# Patient Record
Sex: Female | Born: 1945
Health system: Southern US, Community
[De-identification: ages and names within clinical notes are randomized; demographics above are authoritative.]

## PROBLEM LIST (undated history)

## (undated) DIAGNOSIS — E785 Hyperlipidemia, unspecified: Secondary | ICD-10-CM

## (undated) DIAGNOSIS — N200 Calculus of kidney: Secondary | ICD-10-CM

## (undated) DIAGNOSIS — M8580 Other specified disorders of bone density and structure, unspecified site: Secondary | ICD-10-CM

## (undated) DIAGNOSIS — I10 Essential (primary) hypertension: Secondary | ICD-10-CM

## (undated) DIAGNOSIS — M1990 Unspecified osteoarthritis, unspecified site: Secondary | ICD-10-CM

## (undated) DIAGNOSIS — Z86010 Personal history of colonic polyps: Secondary | ICD-10-CM

## (undated) DIAGNOSIS — T7840XA Allergy, unspecified, initial encounter: Secondary | ICD-10-CM

## (undated) HISTORY — DX: Essential (primary) hypertension: I10

## (undated) HISTORY — DX: Other specified disorders of bone density and structure, unspecified site: M85.80

## (undated) HISTORY — PX: DILATION AND CURETTAGE OF UTERUS: SHX78

## (undated) HISTORY — DX: Unspecified osteoarthritis, unspecified site: M19.90

## (undated) HISTORY — DX: Hyperlipidemia, unspecified: E78.5

## (undated) HISTORY — DX: Allergy, unspecified, initial encounter: T78.40XA

## (undated) HISTORY — DX: Calculus of kidney: N20.0

## (undated) HISTORY — DX: Personal history of colonic polyps: Z86.010

## (undated) HISTORY — PX: COLONOSCOPY: SHX174

---

## 1978-04-21 HISTORY — PX: ABDOMINAL HYSTERECTOMY: SHX81

## 2003-04-07 ENCOUNTER — Other Ambulatory Visit: Admission: RE | Admit: 2003-04-07 | Discharge: 2003-04-07 | Payer: Self-pay | Admitting: Family Medicine

## 2004-04-26 ENCOUNTER — Ambulatory Visit: Payer: Self-pay | Admitting: Family Medicine

## 2004-07-17 ENCOUNTER — Other Ambulatory Visit: Admission: RE | Admit: 2004-07-17 | Discharge: 2004-07-17 | Payer: Self-pay | Admitting: Family Medicine

## 2004-07-17 ENCOUNTER — Ambulatory Visit: Payer: Self-pay | Admitting: Family Medicine

## 2004-07-17 LAB — CONVERTED CEMR LAB: Pap Smear: NORMAL

## 2004-08-01 ENCOUNTER — Ambulatory Visit: Payer: Self-pay | Admitting: Family Medicine

## 2005-07-10 ENCOUNTER — Ambulatory Visit: Payer: Self-pay | Admitting: Family Medicine

## 2005-07-16 ENCOUNTER — Ambulatory Visit: Payer: Self-pay | Admitting: Family Medicine

## 2006-11-25 ENCOUNTER — Ambulatory Visit: Payer: Self-pay | Admitting: Specialist

## 2007-10-05 ENCOUNTER — Encounter: Payer: Self-pay | Admitting: Family Medicine

## 2007-10-05 DIAGNOSIS — M810 Age-related osteoporosis without current pathological fracture: Secondary | ICD-10-CM | POA: Insufficient documentation

## 2007-10-05 DIAGNOSIS — Z87442 Personal history of urinary calculi: Secondary | ICD-10-CM | POA: Insufficient documentation

## 2007-10-05 DIAGNOSIS — E785 Hyperlipidemia, unspecified: Secondary | ICD-10-CM | POA: Insufficient documentation

## 2007-10-05 DIAGNOSIS — M8580 Other specified disorders of bone density and structure, unspecified site: Secondary | ICD-10-CM | POA: Insufficient documentation

## 2007-10-05 DIAGNOSIS — J329 Chronic sinusitis, unspecified: Secondary | ICD-10-CM | POA: Insufficient documentation

## 2007-10-05 DIAGNOSIS — J309 Allergic rhinitis, unspecified: Secondary | ICD-10-CM | POA: Insufficient documentation

## 2007-10-06 ENCOUNTER — Ambulatory Visit: Payer: Self-pay | Admitting: Family Medicine

## 2007-11-04 ENCOUNTER — Ambulatory Visit: Payer: Self-pay | Admitting: Internal Medicine

## 2007-11-04 DIAGNOSIS — K649 Unspecified hemorrhoids: Secondary | ICD-10-CM | POA: Insufficient documentation

## 2007-11-11 ENCOUNTER — Ambulatory Visit: Payer: Self-pay | Admitting: Family Medicine

## 2007-11-19 ENCOUNTER — Encounter (INDEPENDENT_AMBULATORY_CARE_PROVIDER_SITE_OTHER): Payer: Self-pay | Admitting: *Deleted

## 2007-12-16 ENCOUNTER — Ambulatory Visit: Payer: Self-pay | Admitting: Internal Medicine

## 2007-12-16 ENCOUNTER — Encounter: Payer: Self-pay | Admitting: Internal Medicine

## 2007-12-16 LAB — HM COLONOSCOPY

## 2007-12-17 DIAGNOSIS — Z860100 Personal history of colon polyps, unspecified: Secondary | ICD-10-CM

## 2007-12-17 HISTORY — DX: Personal history of colon polyps, unspecified: Z86.0100

## 2007-12-17 HISTORY — DX: Personal history of colonic polyps: Z86.010

## 2007-12-19 ENCOUNTER — Encounter: Payer: Self-pay | Admitting: Internal Medicine

## 2009-01-16 ENCOUNTER — Ambulatory Visit: Payer: Self-pay | Admitting: Family Medicine

## 2009-03-02 ENCOUNTER — Ambulatory Visit: Payer: Self-pay | Admitting: Family Medicine

## 2009-03-02 DIAGNOSIS — E559 Vitamin D deficiency, unspecified: Secondary | ICD-10-CM | POA: Insufficient documentation

## 2009-03-02 LAB — CONVERTED CEMR LAB
ALT: 22 units/L (ref 0–35)
Basophils Relative: 1.1 % (ref 0.0–3.0)
Bilirubin, Direct: 0.1 mg/dL (ref 0.0–0.3)
Chloride: 106 meq/L (ref 96–112)
Direct LDL: 179.6 mg/dL
Eosinophils Relative: 4.6 % (ref 0.0–5.0)
HCT: 40.6 % (ref 36.0–46.0)
Hemoglobin: 13.6 g/dL (ref 12.0–15.0)
Lymphs Abs: 1.4 10*3/uL (ref 0.7–4.0)
MCV: 92.9 fL (ref 78.0–100.0)
Monocytes Absolute: 0.4 10*3/uL (ref 0.1–1.0)
Neutro Abs: 2 10*3/uL (ref 1.4–7.7)
Potassium: 3.8 meq/L (ref 3.5–5.1)
RBC: 4.37 M/uL (ref 3.87–5.11)
TSH: 0.44 microintl units/mL (ref 0.35–5.50)
Total CHOL/HDL Ratio: 6
Total Protein: 7.5 g/dL (ref 6.0–8.3)
VLDL: 22.2 mg/dL (ref 0.0–40.0)
WBC: 4 10*3/uL — ABNORMAL LOW (ref 4.5–10.5)

## 2009-03-14 ENCOUNTER — Encounter: Payer: Self-pay | Admitting: Family Medicine

## 2009-03-29 ENCOUNTER — Encounter (INDEPENDENT_AMBULATORY_CARE_PROVIDER_SITE_OTHER): Payer: Self-pay | Admitting: *Deleted

## 2009-05-23 ENCOUNTER — Ambulatory Visit: Payer: Self-pay | Admitting: Family Medicine

## 2009-05-24 LAB — CONVERTED CEMR LAB
ALT: 21 units/L (ref 0–35)
HDL: 45 mg/dL (ref >39.00)
Total CHOL/HDL Ratio: 4

## 2009-08-23 ENCOUNTER — Ambulatory Visit: Payer: Self-pay | Admitting: Family Medicine

## 2009-08-27 LAB — CONVERTED CEMR LAB
Total CHOL/HDL Ratio: 4
Triglycerides: 85 mg/dL (ref 0.0–149.0)

## 2010-05-02 ENCOUNTER — Ambulatory Visit
Admission: RE | Admit: 2010-05-02 | Discharge: 2010-05-02 | Payer: Self-pay | Source: Home / Self Care | Attending: Family Medicine | Admitting: Family Medicine

## 2010-05-03 ENCOUNTER — Telehealth: Payer: Self-pay | Admitting: Family Medicine

## 2010-05-10 ENCOUNTER — Telehealth: Payer: Self-pay | Admitting: Family Medicine

## 2010-05-19 LAB — CONVERTED CEMR LAB
Cholesterol: 177 mg/dL (ref 0–200)
HDL: 35.4 mg/dL — ABNORMAL LOW (ref >39.0)
LDL Cholesterol: 120 mg/dL — ABNORMAL HIGH (ref 0–99)
VLDL: 21 mg/dL (ref 0–40)

## 2010-05-23 NOTE — Progress Notes (Signed)
Summary: feeling worse   Phone Note Call from Patient Call back at Home Phone [phone]   Caller: Patient Call For: Colleen Cochran Summary of Call: Patient saw you  on the 12th and she says that she  is not feeling any better and actually is feeling worse than before. She is asking if she can get an antibiotic called to [facility] on [address]  Initial call taken by: Melody Comas,  May 10, 2010 11:11 AM  Follow-up for Phone Call        I called patient.  Sx increased last night.  More rhinorrhea with discolored discharge from nose.  Tooth pain noted.  Still with some cough.  Using her inhaler with some transient relief.   Amoxil sent.  follow up as needed. she agrees.  Follow-up by: Crawford Givens Cochran,  May 10, 2010 1:42 PM    New/Updated Medications: AMOXICILLIN 875 MG TABS (AMOXICILLIN) 1 by mouth by mouth two times a day Prescriptions: AMOXICILLIN 875 MG TABS (AMOXICILLIN) 1 by mouth by mouth two times a day  #20 x 0   Entered and Authorized by:   Crawford Givens Cochran   Signed by:   Crawford Givens Cochran on 05/10/2010   Method used:   Electronically to        [facility]. [address] [phone]* (retail)       [address] [place], [place]  [id number]       Ph: [phone]       Fax: [phone]   RxID:   [id number]

## 2010-05-23 NOTE — Progress Notes (Signed)
Summary: update on BP  Phone Note Call from Patient Call back at Home Phone [phone]   Caller: Patient Call For: Judith Part MD Summary of Call: Patient called to let you know that when she went to drug store yesterday afternoon to get her med her BP was 155/74, last night at home it was 137/83, and this morning it was 137/83. Please advise.  Initial call taken by: Melody Comas,  May 03, 2010 3:31 PM  Follow-up for Phone Call        Here readings are fine.  If consistently above 140/90, then notify the clinic.  thanks.  Follow-up by: Crawford Givens MD,  May 03, 2010 5:27 PM  Additional Follow-up for Phone Call Additional follow up Details #1::        Patient Advised.   Lugene Fuquay CMA (AAMA)  May 03, 2010 5:29 PM

## 2010-05-23 NOTE — Assessment & Plan Note (Signed)
Summary: COUGH/CLE   Vital Signs:  Patient profile:   65 year old female Menstrual status:  hysterectomy Height:      64.25 inches Weight:      146.75 pounds BMI:     25.08 Temp:     98.4 degrees F oral Pulse rate:   84 / minute Pulse rhythm:   regular BP sitting:   170 / 84  (left arm) Cuff size:   regular  Vitals Entered By: Delilah Shan CMA Nahmir Zeidman Dull) (May 02, 2010 3:24 PM) CC: Cough   History of Present Illness: Started with cough and cold symptoms.  Cough would fluctuate, worse at night and early AM.  Tickle in throat and then would have fits of coughing.   Cough is some better today.  Fatigued and not sleeping well.  Hears an occ wheeze.  Some sputum initially, not much now.  Took otc meds with some relief.  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  Otherwise negative.    Physical Exam  General:  no apparent distress normocephalic atraumatic tm wnl nasal exam unremarkable, no erythema mucous membranes moist no exudates neck supple w/o LA scattered wheeze/ronchi.  ronchi clear with cough no increase in wob.  no focal decrease in bs regular rate and rhythm ext well perfused.    Impression & Recommendations:  Problem # 1:  COUGH (ICD-786.2) D/w patient to avoid meds with pseudophed.  Will use SABA as needed.  She likey has a prolonged cough due to prev viral infection that is slowly improving . She'll check BP at home and call back with update . She agrees. No indication for antibiotics as she is improving and this is likely not going to respond to antibiotics . Orders: Prescription Created Electronically [phone])  Complete Medication List: 1)  Calcium 600 600 Mg Tabs (Calcium carbonate) .... Take 1 tablet by  mouth once a day 2)  Zocor 20 Mg Tabs (Simvastatin) .... 1/2 by mouth once daily 3)  Vitamin D 1000 Unit Tabs (Cholecalciferol) .... Take 1 tablet by mouth once a day 4)  Ventolin Hfa 108 (90 Base) Mcg/act Aers (Albuterol sulfate) .Marland Kitchen.. 1-2 puffs every 4 hours as needed for cough or wheeze  Patient Instructions: 1)  Get plenty of rest, drink lots of clear liquids, and try plain mucinex.  I wouldn't use anything with "-D" in the title because the pseudophed can raise your pressure.   2)  Check your pressure at the house in the morning and let us know about the numbers.   3)  I would use the albuterol inhaler every 4 hours as needed for the cough and wheeze.  4)  Take care.  Prescriptions: VENTOLIN HFA 108 (90 BASE) MCG/ACT AERS (ALBUTEROL SULFATE) 1-2 puffs every 4 hours as needed for cough or wheeze  #1 x 1   Entered by:   Crawford Givens MD   Authorized by:   Judith Part MD   Signed by:   Crawford Givens MD on 05/02/2010   Method used:   Electronically to        [facility]. [employer] [phone]* (retail)       [address] [place],  [place]  [id number]       Ph: [phone]       Fax: [phone]   RxID:   [id number] ZOCOR 20 MG TABS (SIMVASTATIN) 1/2 by mouth once daily  #15 Tablet x 1   Entered by:   Delilah Shan CMA (AAMA)   Authorized by:   Judith Part MD   Signed by:   Delilah Shan CMA Evamae Rowen Dull) on 05/02/2010   Method used:   Electronically to        [facility]. [address] [phone]* (retail)       [address] [place], [place]  [id number]       Ph: [phone]       Fax: [phone]   RxID:   [id number]    Orders Added: 1)  Est. Patient Level III [62831] 2)  Prescription Created Electronically [phone]    Current Allergies (reviewed today): No known allergies

## 2010-06-20 ENCOUNTER — Other Ambulatory Visit: Payer: Self-pay | Admitting: Family Medicine

## 2010-06-20 ENCOUNTER — Other Ambulatory Visit (INDEPENDENT_AMBULATORY_CARE_PROVIDER_SITE_OTHER): Payer: 59

## 2010-06-20 ENCOUNTER — Encounter (INDEPENDENT_AMBULATORY_CARE_PROVIDER_SITE_OTHER): Payer: Self-pay | Admitting: *Deleted

## 2010-06-20 ENCOUNTER — Telehealth (INDEPENDENT_AMBULATORY_CARE_PROVIDER_SITE_OTHER): Payer: Self-pay | Admitting: *Deleted

## 2010-06-20 DIAGNOSIS — Z0000 Encounter for general adult medical examination without abnormal findings: Secondary | ICD-10-CM

## 2010-06-20 DIAGNOSIS — E785 Hyperlipidemia, unspecified: Secondary | ICD-10-CM

## 2010-06-20 DIAGNOSIS — E559 Vitamin D deficiency, unspecified: Secondary | ICD-10-CM

## 2010-06-20 LAB — BASIC METABOLIC PANEL
CO2: 28 mEq/L (ref 19–32)
Calcium: 9.1 mg/dL (ref 8.4–10.5)
Chloride: 102 mEq/L (ref 96–112)
Creatinine, Ser: 0.5 mg/dL (ref 0.4–1.2)
Glucose, Bld: 91 mg/dL (ref 70–99)

## 2010-06-20 LAB — CBC WITH DIFFERENTIAL/PLATELET
Basophils Absolute: 0 10*3/uL (ref 0.0–0.1)
Basophils Relative: 0.6 % (ref 0.0–3.0)
Eosinophils Absolute: 0.1 10*3/uL (ref 0.0–0.7)
Hemoglobin: 13.3 g/dL (ref 12.0–15.0)
Lymphocytes Relative: 27.3 % (ref 12.0–46.0)
MCHC: 34.5 g/dL (ref 30.0–36.0)
MCV: 89.2 fl (ref 78.0–100.0)
Monocytes Absolute: 0.4 10*3/uL (ref 0.1–1.0)
Neutro Abs: 2.7 10*3/uL (ref 1.4–7.7)
Neutrophils Relative %: 61 % (ref 43.0–77.0)
RBC: 4.32 Mil/uL (ref 3.87–5.11)
RDW: 13.4 % (ref 11.5–14.6)

## 2010-06-20 LAB — HEPATIC FUNCTION PANEL
Albumin: 4.3 g/dL (ref 3.5–5.2)
Total Protein: 7.4 g/dL (ref 6.0–8.3)

## 2010-06-20 LAB — LIPID PANEL
Cholesterol: 169 mg/dL (ref 0–200)
HDL: 45.9 mg/dL (ref >39.00)
Triglycerides: 84 mg/dL (ref 0.0–149.0)

## 2010-06-21 ENCOUNTER — Encounter: Payer: Self-pay | Admitting: Family Medicine

## 2010-06-24 ENCOUNTER — Encounter: Payer: Self-pay | Admitting: Family Medicine

## 2010-06-24 ENCOUNTER — Encounter (INDEPENDENT_AMBULATORY_CARE_PROVIDER_SITE_OTHER): Payer: 59 | Admitting: Family Medicine

## 2010-06-24 DIAGNOSIS — R030 Elevated blood-pressure reading, without diagnosis of hypertension: Secondary | ICD-10-CM | POA: Insufficient documentation

## 2010-06-24 DIAGNOSIS — M899 Disorder of bone, unspecified: Secondary | ICD-10-CM

## 2010-06-24 DIAGNOSIS — Z0000 Encounter for general adult medical examination without abnormal findings: Secondary | ICD-10-CM

## 2010-06-24 DIAGNOSIS — E785 Hyperlipidemia, unspecified: Secondary | ICD-10-CM

## 2010-06-24 DIAGNOSIS — Z1231 Encounter for screening mammogram for malignant neoplasm of breast: Secondary | ICD-10-CM

## 2010-06-24 DIAGNOSIS — E559 Vitamin D deficiency, unspecified: Secondary | ICD-10-CM

## 2010-06-24 DIAGNOSIS — Z780 Asymptomatic menopausal state: Secondary | ICD-10-CM

## 2010-06-27 NOTE — Progress Notes (Signed)
----   Converted from flag ---- ---- 06/18/2010 8:45 PM, Colon Flattery Tower MD wrote: please check wellness and lipid and vit D for V70.0 and 272 and 733.0 thanks  ---- 06/17/2010 10:21 AM, Liane Comber CMA (AAMA) wrote: Lab orders please! Good Morning! This pt is scheduled for cpx labs [facility], which labs to draw and dx codes to use? Thanks Tasha ------------------------------

## 2010-07-09 NOTE — Assessment & Plan Note (Signed)
Summary: CPX/RBH   Vital Signs:  Patient profile:   65 year old female Menstrual status:  hysterectomy Height:      64.25 inches Weight:      143 pounds BMI:     24.44 Temp:     98.7 degrees F oral Pulse rate:   88 / minute Pulse rhythm:   regular BP sitting:   150 / 78  (left arm) Cuff size:   regular  Vitals Entered By: Lewanda Rife LPN (June 24, 5407 10:26 AM)  Serial Vital Signs/Assessments:  Time      Position  BP       Pulse  Resp  Temp     By                     141/90                         Judith Part MD  CC: CPX LMP complete hyst ?yr, CHF Management   History of Present Illness: here for wellness exam and to review chronic health problems   is feeling ok  is nervous as usual    wt is down 3 lb with bmi of 24  150/78 good bp-- thinks that is all anxiety  she keeps an eye on her bp due to family hx -- 133/83 average    osteopenia  dexa 09 ca and D D level is 32 she is taking 1000-2000 vit D per day also taking calcium   hyst tot in past- not for cancer  no symptoms  no new partners  pap 06  mam 12/10 self exam  - no lumps or changes   colonosc due 2014 for polyps no stool changes   Td09 does not get flu shots has never had shingles vaccine -- is interested   chol stable on zocor  Last Lipid ProfileCholesterol: 169 (06/20/2010 10:05:54 AM)HDL:  45.90 (06/20/2010 10:05:54 AM)LDL:  106 (06/20/2010 10:05:54 AM)Triglycerides:  Last Liver profileSGOT:  27 (06/20/2010 10:05:54 AM)SPGT:  23 (06/20/2010 10:05:54 AM)T. Bili:  0.5 (06/20/2010 10:05:54 AM)Alk Phos:  58 (06/20/2010 10:05:54 AM)        Allergies (verified): No Known Drug Allergies  Past History:  Past Medical History: Last updated: 10/05/2007 Allergic rhinitis Hyperlipidemia Nephrolithiasis, hx of Osteopenia  Past Surgical History: Last updated: 10/05/2007 Caesarean section (1969) 2 D & C's Hysterectomy- total for bleeding (1980) Kidney stone Uteroscopy with  laser Dexa- osteopenia spine -2.48 Left leg fracture x 2 CT- abd and pelvis, neg for stones (11/2006)  Family History: Last updated: 03/02/2009 Father: kidney failure, CABG, CAD,MI, hip fx (? OP) Mother: HTN, OP, ? 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 Eyes:  Denies blurring and eye irritation. CV:  Denies chest pain or discomfort, palpitations, and shortness of breath with exertion. Resp:  Denies cough, shortness of breath, and wheezing. GI:  Denies abdominal pain, change in bowel habits, and indigestion. GU:  Denies nocturia. MS:  Denies joint pain, joint redness, joint swelling, and stiffness. Derm:  Denies itching, lesion(s), poor wound healing, and rash. Neuro:  Denies numbness and tingling. Psych:  Denies  anxiety. Endo:  Denies excessive thirst and excessive urination. Heme:  Denies abnormal bruising and bleeding.  Physical Exam  General:  Well-developed,well-nourished,in no acute distress; alert,appropriate and cooperative throughout examination Head:  normocephalic, atraumatic, and no abnormalities observed.   Eyes:  vision grossly intact, pupils equal, pupils round, and pupils reactive to light.  no conjunctival pallor, injection or icterus  Ears:  R ear normal and L ear normal.   Nose:  no nasal discharge.   Mouth:  pharynx pink and moist.   Neck:  supple with full rom and no masses or thyromegally, no JVD or carotid bruit  Chest Wall:  No deformities, masses, or tenderness noted. Breasts:  No mass, nodules, thickening, tenderness, bulging, retraction, inflamation, nipple discharge or skin changes noted.   Lungs:  Normal  respiratory effort, chest expands symmetrically. Lungs are clear to auscultation, no crackles or wheezes. Heart:  Normal rate and regular rhythm. S1 and S2 normal without gallop, murmur, click, rub or other extra sounds. Abdomen:  Bowel sounds positive,abdomen soft and non-tender without masses, organomegaly or hernias noted. no renal bruits  Msk:  No deformity or scoliosis noted of thoracic or lumbar spine.   Pulses:  R and L carotid,radial,femoral,dorsalis pedis and posterior tibial pulses are full and equal bilaterally Extremities:  No clubbing, cyanosis, edema, or deformity noted with normal full range of motion of all joints.   Neurologic:  sensation intact to light touch, gait normal, and DTRs symmetrical and normal.   Skin:  Intact without suspicious lesions or rashes Cervical Nodes:  No lymphadenopathy noted Axillary Nodes:  No palpable lymphadenopathy Inguinal Nodes:  No significant adenopathy Psych:  normal affect, talkative and pleasant    Impression & Recommendations:  Problem # 1:  HEALTH MAINTENANCE EXAM (ICD-V70.0) Assessment Comment Only reviewed health habits including diet, exercise and skin cancer prevention reviewed health maintenance list and family history  wellness labs rev with pt in detail   Problem # 2:  UNSPECIFIED VITAMIN D DEFICIENCY (ICD-268.9) Assessment: Unchanged this is stable on current Vit D -- disc also getting outdoor time dexa scheduled  Orders: Radiology Referral (Radiology)  Problem # 3:  ELEVATED BLOOD PRESSURE WITHOUT DIAGNOSIS OF HYPERTENSION (ICD-796.2) Assessment: New  this is slowly on the rise disc exercise program in detail  will f/u 3 mo  given handout on HTN from [facility] to read  BP today: 150/78 Prior BP: 170/84 (05/02/2010)  Labs Reviewed: Creat: 0.5 (06/20/2010) Chol: 169 (06/20/2010)   HDL: 45.90 (06/20/2010)   LDL: 106 (06/20/2010)   TG: 84.0 (06/20/2010)  Instructed in low sodium diet (DASH Handout) and behavior  modification.    Problem # 4:  OSTEOPENIA (ICD-733.90) Assessment: Unchanged schedule dexa- overdue disc imp of ca and D and exercise  Her updated medication list for this problem includes:    Calcium 600 600 Mg Tabs (Calcium carbonate) .Marland Kitchen... Take 1 tablet by mouth once a day    Vitamin D 1000 Unit Tabs (Cholecalciferol) .Marland Kitchen... Take 1 tablet by mouth once a day  Orders: Radiology Referral (Radiology)  Complete Medication List: 1)  Calcium 600 600 Mg Tabs (Calcium carbonate) .... Take 1 tablet by mouth once a day 2)  Zocor 20 Mg Tabs (Simvastatin) .... 1/2 by mouth once daily 3)  Vitamin D 1000 Unit Tabs (Cholecalciferol) .... Take 1 tablet by mouth once a day 4)  Ventolin Hfa 108 (90 Base) Mcg/act Aers (Albuterol sulfate) .Marland Kitchen.. 1-2 puffs every 4 hours as needed for cough or wheeze 5)  Anacin  400-32 Mg Tabs (Aspirin-caffeine) .... Two tablets by mouth daily for arthritis  Other Orders: Prescription Created Electronically [phone])  CHF Assessment/Plan:      The patient's current weight is 143 pounds.  Her previous weight was 146.75 pounds.    Patient Instructions: 1)  if you are interested in shingles vaccine - call your insurance company and then call us to schedule  2)  we will schedule mammogram and dexa at check out  3)  labs are ok  4)  try to eat low cholesterol diet  5)  really work on exercise 6)  f/u in 3 months for blood pressure Prescriptions: ZOCOR 20 MG TABS (SIMVASTATIN) 1/2 by mouth once daily  #15 x 11   Entered and Authorized by:   Judith Part MD   Signed by:   Judith Part MD on 06/24/2010   Method used:   Electronically to        [facility]. [address] [phone]* (retail)       [address] [place], [place]  [id number]       Ph: [phone]       Fax: [phone]   RxID:   [id number]    Orders Added: 1)  Radiology Referral [Radiology] 2)  Radiology Referral [Radiology] 3)  Prescription Created Electronically [[id number]] 4)  Est. Patient  40-64 years [99396] 5)  Est. Patient Level II [42595]    Current Allergies (reviewed today): No known allergies

## 2010-07-17 ENCOUNTER — Ambulatory Visit: Payer: Self-pay | Admitting: Family Medicine

## 2010-07-17 LAB — HM MAMMOGRAPHY: HM Mammogram: NORMAL

## 2010-07-24 ENCOUNTER — Ambulatory Visit: Payer: Self-pay | Admitting: Family Medicine

## 2010-07-29 ENCOUNTER — Encounter: Payer: Self-pay | Admitting: Family Medicine

## 2010-08-02 ENCOUNTER — Telehealth: Payer: Self-pay

## 2010-08-02 NOTE — Telephone Encounter (Signed)
Patient notified as instructed by telephone mammogram was normal and needs to be repeated in 1 year. Noted on Health maintenance. Report sent for scanning.

## 2010-09-16 ENCOUNTER — Encounter: Payer: Self-pay | Admitting: Family Medicine

## 2010-09-24 ENCOUNTER — Encounter: Payer: Self-pay | Admitting: Family Medicine

## 2010-09-24 ENCOUNTER — Ambulatory Visit (INDEPENDENT_AMBULATORY_CARE_PROVIDER_SITE_OTHER): Payer: 59 | Admitting: Family Medicine

## 2010-09-24 VITALS — BP 146/90 | HR 80 | Temp 98.8°F | Ht 64.25 in | Wt 142.0 lb

## 2010-09-24 DIAGNOSIS — Z23 Encounter for immunization: Secondary | ICD-10-CM

## 2010-09-24 DIAGNOSIS — I10 Essential (primary) hypertension: Secondary | ICD-10-CM | POA: Insufficient documentation

## 2010-09-24 MED ORDER — LISINOPRIL 10 MG PO TABS: 10.0000 mg | ORAL_TABLET | Freq: Every day | ORAL | Status: DC

## 2010-09-24 NOTE — Patient Instructions (Signed)
Start lisinopril 10 mg daily Keep working on healthy low salt diet and drink lots of water  Also start regular exercise  Follow up in 6 weeks  If any side effects or problems - then please call

## 2010-09-24 NOTE — Assessment & Plan Note (Signed)
New HTN with family hx  Adv to start exercise 5 days per week  Work on low sodium diet  Start lisinopril 10 mg daily- update if side eff F/u 6 wk

## 2010-09-24 NOTE — Progress Notes (Signed)
Subjective:    Patient ID: Colleen Cochran, female    DOB: [birth year], 65 y.o.   MRN: [id number]  HPI Here for f/u of elevated blood pressure  Last time 150/78 Today 146/90 Is checking at home -- goes up and down - range 130/80 to 140/90 or above  Given literature on lifestyle change -- has not started yet  Has cut back on her salt -- that is a good start   Mother and brother have HTN  No symptoms - ha or cp or edema or palp  Last visit ordered dexa Osteopenia 4/12 with LS T score -2.1 No meds but ca and d  Patient Active Problem List  Diagnoses  . UNSPECIFIED VITAMIN D DEFICIENCY  . HYPERLIPIDEMIA  . HEMORRHOIDS  . SINUSITIS, RECURRENT  . ALLERGIC RHINITIS  . OSTEOPENIA  . NEPHROLITHIASIS, HX OF  . POSTMENOPAUSAL STATUS  . Hypertension   Past Medical History  Diagnosis Date  . Allergy     allergic rhinitis  . Hyperlipidemia   . Nephrolithiasis     Hx of  . Osteopenia   . Kidney stone    Past Surgical History  Procedure Date  . Cesarean section 1969  . Dilation and curettage of uterus     x2  . Abdominal hysterectomy 1980    total for bleeding   History  Substance Use Topics  . Smoking status: Never Smoker   . Smokeless tobacco: Not on file  . Alcohol Use: No   Family History  Problem Relation Age of Onset  . Hypertension Mother   . Osteoporosis Mother   . Heart disease Father     CABG,CAD,MI  . Kidney failure Father   . Osteoporosis Father     ? of OP pt had hip fx.  . Hypertension Brother    No Known Allergies Current Outpatient Prescriptions on File Prior to Visit  Medication Sig Dispense Refill  . Aspirin-Caffeine (ANACIN) 400-32 MG TABS Take 2 tablets by mouth daily. For arthritis         . calcium carbonate (OS-CAL) 600 MG TABS Take 600 mg by mouth daily.        . cholecalciferol (VITAMIN D) 1000 UNITS tablet Take 1,000 Units by mouth daily.        . simvastatin (ZOCOR) 20 MG tablet Take 20 mg by mouth daily.        Marland Kitchen albuterol  (VENTOLIN HFA) 108 (90 BASE) MCG/ACT inhaler Inhale 1-2 puffs into the lungs every 4 (four) hours as needed.           Review of Systems Review of Systems  Constitutional: Negative for fever, appetite change, fatigue and unexpected weight change.  Eyes: Negative for pain and visual disturbance.  Respiratory: Negative for cough and shortness of breath.   Cardiovascular: Negative. For cp or palp or edema  Gastrointestinal: Negative for nausea, diarrhea and constipation.  Genitourinary: Negative for urgency and frequency.  Skin: Negative for pallor.  Neurological: Negative for weakness, light-headedness, numbness and headaches.  Hematological: Negative for adenopathy. Does not bruise/bleed easily.  Psychiatric/Behavioral: Negative for dysphoric mood. The patient is not nervous/anxious.          Objective:   Physical Exam  Constitutional: She appears well-developed and well-nourished. No distress.  HENT:  Head: Normocephalic and atraumatic.  Mouth/Throat: Oropharynx is clear and moist.  Eyes: Conjunctivae and EOM are normal. Pupils are equal, round, and reactive to light.  Neck: Normal range of motion. Neck supple. No JVD present.  Carotid bruit is not present. No thyromegaly present.  Cardiovascular: Normal rate, regular rhythm, normal heart sounds and intact distal pulses.   Pulmonary/Chest: Effort normal and breath sounds normal. No respiratory distress. She has no wheezes.  Abdominal: Soft. Bowel sounds are normal. She exhibits no distension, no abdominal bruit and no mass. There is no tenderness.  Musculoskeletal: She exhibits no edema and no tenderness.  Lymphadenopathy:    She has no cervical adenopathy.  Neurological: She is alert. She has normal reflexes. Coordination normal.  Skin: Skin is warm and dry. No rash noted. No erythema. No pallor.  Psychiatric: She has a normal mood and affect.          Assessment & Plan:

## 2010-11-05 ENCOUNTER — Encounter: Payer: Self-pay | Admitting: Family Medicine

## 2010-11-05 ENCOUNTER — Ambulatory Visit (INDEPENDENT_AMBULATORY_CARE_PROVIDER_SITE_OTHER): Payer: 59 | Admitting: Family Medicine

## 2010-11-05 DIAGNOSIS — I10 Essential (primary) hypertension: Secondary | ICD-10-CM

## 2010-11-05 DIAGNOSIS — E785 Hyperlipidemia, unspecified: Secondary | ICD-10-CM

## 2010-11-05 NOTE — Patient Instructions (Signed)
Keep up the great work with healthy diet and exercise  Blood pressure is great  Stay on lisinopril  Schedule fasting labs and then follow up in 6 months

## 2010-11-05 NOTE — Progress Notes (Signed)
Subjective:    Patient ID: Colleen Cochran, female    DOB: 1945-05-19, 65 y.o.   MRN: [id number]  HPI Here for f/u of HTN and is doing well  Feeling ok  124/74 today On lisinopril Wt is down 5 lb  Is exercising 30 minutes on bike inside every day  Eating better now    Labs ok in march  Reviewed them with pt Lab Results  Component Value Date   CHOL 169 06/20/2010   CHOL 162 08/23/2009   CHOL 170 05/23/2009   Lab Results  Component Value Date   HDL 45.90 06/20/2010   HDL 42.30 08/23/2009   HDL 45.00 05/23/2009   Lab Results  Component Value Date   LDLCALC 106* 06/20/2010   LDLCALC 103* 08/23/2009   LDLCALC 106* 05/23/2009   Lab Results  Component Value Date   TRIG 84.0 06/20/2010   TRIG 85.0 08/23/2009   TRIG 97.0 05/23/2009   Lab Results  Component Value Date   CHOLHDL 4 06/20/2010   CHOLHDL 4 08/23/2009   CHOLHDL 4 05/23/2009   Lab Results  Component Value Date   LDLDIRECT 179.6 03/02/2009    Patient Active Problem List  Diagnoses  . UNSPECIFIED VITAMIN D DEFICIENCY  . HYPERLIPIDEMIA  . HEMORRHOIDS  . SINUSITIS, RECURRENT  . ALLERGIC RHINITIS  . OSTEOPENIA  . NEPHROLITHIASIS, HX OF  . POSTMENOPAUSAL STATUS  . Hypertension   Past Medical History  Diagnosis Date  . Allergy     allergic rhinitis  . Hyperlipidemia   . Nephrolithiasis     Hx of  . Osteopenia   . Kidney stone    Past Surgical History  Procedure Date  . Cesarean section 1969  . Dilation and curettage of uterus     x2  . Abdominal hysterectomy 1980    total for bleeding   History  Substance Use Topics  . Smoking status: Never Smoker   . Smokeless tobacco: Not on file  . Alcohol Use: No   Family History  Problem Relation Age of Onset  . Hypertension Mother   . Osteoporosis Mother   . Heart disease Father     CABG,CAD,MI  . Kidney failure Father   . Osteoporosis Father     ? of OP pt had hip fx.  . Hypertension Brother    No Known Allergies Current Outpatient Prescriptions on File Prior to  Visit  Medication Sig Dispense Refill  . Aspirin-Caffeine (ANACIN) 400-32 MG TABS Take 2 tablets by mouth daily. For arthritis         . calcium carbonate (OS-CAL) 600 MG TABS Take 600 mg by mouth daily.        . cholecalciferol (VITAMIN D) 1000 UNITS tablet Take 1,000 Units by mouth daily.        Marland Kitchen lisinopril (PRINIVIL,ZESTRIL) 10 MG tablet Take 1 tablet (10 mg total) by mouth daily.  30 tablet  11  . simvastatin (ZOCOR) 20 MG tablet Take 10 mg by mouth daily.       Marland Kitchen albuterol (VENTOLIN HFA) 108 (90 BASE) MCG/ACT inhaler Inhale 1-2 puffs into the lungs every 4 (four) hours as needed.              Review of Systems Review of Systems  Constitutional: Negative for fever, appetite change, fatigue and unexpected weight change.  Eyes: Negative for pain and visual disturbance.  Respiratory: Negative for cough and shortness of breath.   Cardiovascular: Negative.  for cp or sob or palp Gastrointestinal:  Negative for nausea, diarrhea and constipation.  Genitourinary: Negative for urgency and frequency.  Skin: Negative for pallor.  Neurological: Negative for weakness, light-headedness, numbness and headaches.  Hematological: Negative for adenopathy. Does not bruise/bleed easily.  Psychiatric/Behavioral: Negative for dysphoric mood. The patient is not nervous/anxious.          Objective:   Physical Exam  Constitutional: She appears well-developed and well-nourished. No distress.  HENT:  Head: Normocephalic and atraumatic.  Mouth/Throat: Oropharynx is clear and moist.  Eyes: Conjunctivae and EOM are normal. Pupils are equal, round, and reactive to light.  Neck: Normal range of motion. Neck supple. No JVD present. Carotid bruit is not present. No thyromegaly present.  Cardiovascular: Normal rate, regular rhythm and normal heart sounds.   Pulmonary/Chest: Effort normal and breath sounds normal. No respiratory distress. She has no wheezes.  Abdominal: Soft. Bowel sounds are normal. She  exhibits no distension and no mass. There is no tenderness.  Musculoskeletal: She exhibits no edema.  Lymphadenopathy:    She has no cervical adenopathy.  Neurological: She is alert. She has normal reflexes.  Skin: Skin is warm and dry. No rash noted. No erythema. No pallor.  Psychiatric: She has a normal mood and affect.          Assessment & Plan:

## 2010-11-05 NOTE — Assessment & Plan Note (Signed)
Has been well controlled with statin  Now even better diet Lab 6 mo and f/u

## 2010-11-05 NOTE — Assessment & Plan Note (Signed)
Much imp with low dose lisinopril and diet/exercise Commended No problems Rev labs from march Planned lab and f/u 6 mo

## 2011-03-12 ENCOUNTER — Telehealth: Payer: Self-pay | Admitting: Family Medicine

## 2011-03-12 NOTE — Telephone Encounter (Signed)
Call pt back.  Still having symptoms and requests for something to be called in.

## 2011-05-05 ENCOUNTER — Other Ambulatory Visit (INDEPENDENT_AMBULATORY_CARE_PROVIDER_SITE_OTHER): Payer: 59

## 2011-05-05 DIAGNOSIS — I10 Essential (primary) hypertension: Secondary | ICD-10-CM

## 2011-05-05 DIAGNOSIS — E785 Hyperlipidemia, unspecified: Secondary | ICD-10-CM

## 2011-05-05 LAB — CBC WITH DIFFERENTIAL/PLATELET
Eosinophils Absolute: 0.2 10*3/uL (ref 0.0–0.7)
MCHC: 34.8 g/dL (ref 30.0–36.0)
MCV: 91 fl (ref 78.0–100.0)
Monocytes Absolute: 0.5 10*3/uL (ref 0.1–1.0)
Neutrophils Relative %: 46 % (ref 43.0–77.0)
Platelets: 301 10*3/uL (ref 150.0–400.0)
RDW: 13.6 % (ref 11.5–14.6)

## 2011-05-05 LAB — COMPREHENSIVE METABOLIC PANEL
AST: 25 U/L (ref 0–37)
Albumin: 4.2 g/dL (ref 3.5–5.2)
Alkaline Phosphatase: 55 U/L (ref 39–117)
BUN: 17 mg/dL (ref 6–23)
Potassium: 3.7 mEq/L (ref 3.5–5.1)
Sodium: 136 mEq/L (ref 135–145)
Total Protein: 7.5 g/dL (ref 6.0–8.3)

## 2011-05-05 LAB — LIPID PANEL
Cholesterol: 161 mg/dL (ref 0–200)
HDL: 45.6 mg/dL (ref >39.00)
VLDL: 18 mg/dL (ref 0.0–40.0)

## 2011-05-09 ENCOUNTER — Ambulatory Visit: Payer: 59 | Admitting: Family Medicine

## 2011-05-19 ENCOUNTER — Ambulatory Visit (INDEPENDENT_AMBULATORY_CARE_PROVIDER_SITE_OTHER): Payer: 59 | Admitting: Family Medicine

## 2011-05-19 ENCOUNTER — Encounter: Payer: Self-pay | Admitting: Family Medicine

## 2011-05-19 VITALS — BP 130/82 | HR 80 | Temp 97.6°F | Ht 64.25 in | Wt 138.0 lb

## 2011-05-19 DIAGNOSIS — E785 Hyperlipidemia, unspecified: Secondary | ICD-10-CM

## 2011-05-19 DIAGNOSIS — I10 Essential (primary) hypertension: Secondary | ICD-10-CM

## 2011-05-19 MED ORDER — LISINOPRIL 10 MG PO TABS: 10.0000 mg | ORAL_TABLET | Freq: Every day | ORAL | Status: DC

## 2011-05-19 MED ORDER — SIMVASTATIN 20 MG PO TABS: 10.0000 mg | ORAL_TABLET | Freq: Every day | ORAL | Status: DC

## 2011-05-19 NOTE — Progress Notes (Signed)
Subjective:    Patient ID: Colleen Cochran, female    DOB: [birth year], 66 y.o.   MRN: [id number]  HPI Here for f/u of HTN and also hyperlipidemia  Is feeling good   bp is  130/82   Today No cp or palpitations or headaches or edema  No side effects to medicines   On ace alone  Wt is stable with good bmi of 23  Lipids on zocor and diet  Lab Results  Component Value Date   CHOL 161 05/05/2011   CHOL 169 06/20/2010   CHOL 162 08/23/2009   Lab Results  Component Value Date   HDL 45.60 05/05/2011   HDL 40.98 06/20/2010   HDL 42.30 08/23/2009   Lab Results  Component Value Date   LDLCALC 97 05/05/2011   LDLCALC 106* 06/20/2010   LDLCALC 103* 08/23/2009   Lab Results  Component Value Date   TRIG 90.0 05/05/2011   TRIG 84.0 06/20/2010   TRIG 85.0 08/23/2009   Lab Results  Component Value Date   CHOLHDL 4 05/05/2011   CHOLHDL 4 06/20/2010   CHOLHDL 4 08/23/2009   Lab Results  Component Value Date   LDLDIRECT 179.6 03/02/2009   very good overall -- is at goal  Diet - is very healthy most of the time Is exercising - uses a bike -- and also walking when she can  Aims for 2-3 times per week   Patient Active Problem List  Diagnoses  . UNSPECIFIED VITAMIN D DEFICIENCY  . HYPERLIPIDEMIA  . HEMORRHOIDS  . SINUSITIS, RECURRENT  . ALLERGIC RHINITIS  . OSTEOPENIA  . NEPHROLITHIASIS, HX OF  . POSTMENOPAUSAL STATUS  . Hypertension   Past Medical History  Diagnosis Date  . Allergy     allergic rhinitis  . Hyperlipidemia   . Nephrolithiasis     Hx of  . Osteopenia   . Kidney stone    Past Surgical History  Procedure Date  . Cesarean section 1969  . Dilation and curettage of uterus     x2  . Abdominal hysterectomy 1980    total for bleeding   History  Substance Use Topics  . Smoking status: Never Smoker   . Smokeless tobacco: Not on file  . Alcohol Use: No   Family History  Problem Relation Age of Onset  . Hypertension Mother   . Osteoporosis Mother   . Heart disease Father      CABG,CAD,MI  . Kidney failure Father   . Osteoporosis Father     ? of OP pt had hip fx.  . Hypertension Brother    No Known Allergies Current Outpatient Prescriptions on File Prior to Visit  Medication Sig Dispense Refill  . Aspirin-Caffeine (ANACIN) 400-32 MG TABS Take 2 tablets by mouth daily. For arthritis         . calcium carbonate (OS-CAL) 600 MG TABS Take 600 mg by mouth daily.        . cholecalciferol (VITAMIN D) 1000 UNITS tablet Take 1,000 Units by mouth daily.        Marland Kitchen albuterol (VENTOLIN HFA) 108 (90 BASE) MCG/ACT inhaler Inhale 1-2 puffs into the lungs every 4 (four) hours as needed.            Review of Systems Review of Systems  Constitutional: Negative for fever, appetite change, fatigue and unexpected weight change.  Eyes: Negative for pain and visual disturbance.  Respiratory: Negative for cough and shortness of breath.   Cardiovascular: Negative for cp or  palpitations    Gastrointestinal: Negative for nausea, diarrhea and constipation.  Genitourinary: Negative for urgency and frequency.  Skin: Negative for pallor or rash   Neurological: Negative for weakness, light-headedness, numbness and headaches.  Hematological: Negative for adenopathy. Does not bruise/bleed easily.  Psychiatric/Behavioral: Negative for dysphoric mood. The patient is not nervous/anxious.          Objective:   Physical Exam  Constitutional: She appears well-developed and well-nourished. No distress.  HENT:  Head: Normocephalic and atraumatic.  Mouth/Throat: Oropharynx is clear and moist.  Eyes: Conjunctivae and EOM are normal. Pupils are equal, round, and reactive to light.  Neck: Normal range of motion. Neck supple. No JVD present. Carotid bruit is not present. Erythema present. No thyromegaly present.  Cardiovascular: Normal rate, regular rhythm and normal heart sounds.  Exam reveals no gallop.   Pulmonary/Chest: Effort normal and breath sounds normal. No respiratory distress.  She has no wheezes.  Abdominal: Soft. Bowel sounds are normal. She exhibits no distension, no abdominal bruit and no mass. There is no tenderness.  Musculoskeletal: She exhibits no edema.  Lymphadenopathy:    She has no cervical adenopathy.  Neurological: She is alert. She has normal reflexes.  Skin: Skin is warm and dry.  Psychiatric: She has a normal mood and affect.          Assessment & Plan:

## 2011-05-19 NOTE — Assessment & Plan Note (Signed)
Lipids are in good control with zocor and diet Disc goals for lipids and reasons to control them Rev labs with pt Rev low sat fat diet in detail  F/u 6 mo

## 2011-05-19 NOTE — Assessment & Plan Note (Signed)
bp in fair control at this time  No changes needed  Disc lifstyle change with low sodium diet and exercise   Refilled med F/u 6 mo

## 2011-05-19 NOTE — Patient Instructions (Signed)
Keep up the good work with diet and exercise No change in medicines  Avoid red meat/ fried foods/ egg yolks/ fatty breakfast meats/ butter, cheese and high fat dairy/ and shellfish   Keep exercising  Annual exam in 6 months with labs prior please

## 2011-10-20 ENCOUNTER — Ambulatory Visit (INDEPENDENT_AMBULATORY_CARE_PROVIDER_SITE_OTHER): Payer: 59 | Admitting: Family Medicine

## 2011-10-20 ENCOUNTER — Encounter: Payer: Self-pay | Admitting: Family Medicine

## 2011-10-20 VITALS — BP 146/70 | HR 90 | Temp 98.2°F | Wt 136.0 lb

## 2011-10-20 DIAGNOSIS — J329 Chronic sinusitis, unspecified: Secondary | ICD-10-CM

## 2011-10-20 MED ORDER — FLUTICASONE PROPIONATE 50 MCG/ACT NA SUSP: 2.0000 | Freq: Every day | NASAL | Status: DC

## 2011-10-20 MED ORDER — ALBUTEROL SULFATE HFA 108 (90 BASE) MCG/ACT IN AERS: 1.0000 | INHALATION_SPRAY | RESPIRATORY_TRACT | Status: DC | PRN

## 2011-10-20 MED ORDER — AMOXICILLIN 875 MG PO TABS: 875.0000 mg | ORAL_TABLET | Freq: Two times a day (BID) | ORAL | Status: AC

## 2011-10-20 NOTE — Progress Notes (Signed)
duration of symptoms: 1 week Rhinorrhea: yes Congestion: yes with some sinus pain ear pain:no but ears feel itchy sore throat: yes, scratchy Cough: some, improved now Myalgias: not now, improved from prev  other concerns: lost voice last week.  Taking mucinex and gargling with salt water with some help.  Throat is still sore, worse at night.   No fevers.    ROS: See HPI.  Otherwise negative.    Meds, vitals, and allergies reviewed.   GEN: nad, alert and oriented HEENT: mucous membranes moist, TM w/o erythema, nasal epithelium injected, OP with cobblestoning, max sinuses minimally ttp B NECK: supple w/o LA CV: rrr. PULM: ctab, no inc wob ABD: soft, +bs EXT: no edema

## 2011-10-20 NOTE — Assessment & Plan Note (Signed)
Hold amoxil for now, use nasal saline and flonase.  Continue other prev supportive measures.  F/u prn.

## 2011-10-20 NOTE — Patient Instructions (Addendum)
Drink plenty of fluids, take tylenol as needed, and gargle with warm salt water for your throat. Use flonase in meantime.  This should gradually improve.  Take care.  Let us know if you have other concerns.   Start the amoxil if not improved in a few days.

## 2011-11-17 ENCOUNTER — Telehealth: Payer: Self-pay | Admitting: Family Medicine

## 2011-11-17 DIAGNOSIS — E785 Hyperlipidemia, unspecified: Secondary | ICD-10-CM

## 2011-11-17 DIAGNOSIS — E559 Vitamin D deficiency, unspecified: Secondary | ICD-10-CM

## 2011-11-17 DIAGNOSIS — Z0000 Encounter for general adult medical examination without abnormal findings: Secondary | ICD-10-CM

## 2011-11-17 DIAGNOSIS — M899 Disorder of bone, unspecified: Secondary | ICD-10-CM

## 2011-11-17 NOTE — Telephone Encounter (Signed)
Message copied by Judy Pimple on Mon Nov 17, 2011  9:11 PM ------      Message from: Alvina Chou      Created: Tue Nov 11, 2011  1:52 PM      Regarding: labs for Tuesday, 7.30.13       Patient is scheduled for CPX labs, please order future labs, Thanks , Camelia Eng

## 2011-11-18 ENCOUNTER — Other Ambulatory Visit (INDEPENDENT_AMBULATORY_CARE_PROVIDER_SITE_OTHER): Payer: 59

## 2011-11-18 DIAGNOSIS — E785 Hyperlipidemia, unspecified: Secondary | ICD-10-CM

## 2011-11-18 DIAGNOSIS — Z0000 Encounter for general adult medical examination without abnormal findings: Secondary | ICD-10-CM

## 2011-11-18 DIAGNOSIS — M899 Disorder of bone, unspecified: Secondary | ICD-10-CM

## 2011-11-18 DIAGNOSIS — E559 Vitamin D deficiency, unspecified: Secondary | ICD-10-CM

## 2011-11-18 LAB — COMPREHENSIVE METABOLIC PANEL
ALT: 21 U/L (ref 0–35)
Alkaline Phosphatase: 53 U/L (ref 39–117)
Creatinine, Ser: 0.6 mg/dL (ref 0.4–1.2)
GFR: 106.67 mL/min (ref >60.00)
Sodium: 143 mEq/L (ref 135–145)
Total Bilirubin: 0.6 mg/dL (ref 0.3–1.2)
Total Protein: 7.2 g/dL (ref 6.0–8.3)

## 2011-11-18 LAB — CBC WITH DIFFERENTIAL/PLATELET
Basophils Absolute: 0 10*3/uL (ref 0.0–0.1)
Eosinophils Absolute: 0.2 10*3/uL (ref 0.0–0.7)
HCT: 38.3 % (ref 36.0–46.0)
Hemoglobin: 12.9 g/dL (ref 12.0–15.0)
Lymphs Abs: 1.5 10*3/uL (ref 0.7–4.0)
MCHC: 33.7 g/dL (ref 30.0–36.0)
MCV: 90.4 fl (ref 78.0–100.0)
Monocytes Absolute: 0.5 10*3/uL (ref 0.1–1.0)
Neutro Abs: 2.3 10*3/uL (ref 1.4–7.7)
Platelets: 279 10*3/uL (ref 150.0–400.0)
RDW: 13.2 % (ref 11.5–14.6)

## 2011-11-18 LAB — LIPID PANEL
Cholesterol: 169 mg/dL (ref 0–200)
HDL: 47.3 mg/dL (ref >39.00)
LDL Cholesterol: 105 mg/dL — ABNORMAL HIGH (ref 0–99)
VLDL: 17.2 mg/dL (ref 0.0–40.0)

## 2011-11-18 LAB — TSH: TSH: 1.07 u[IU]/mL (ref 0.35–5.50)

## 2011-11-19 LAB — VITAMIN D 25 HYDROXY (VIT D DEFICIENCY, FRACTURES): Vit D, 25-Hydroxy: 34 ng/mL (ref 30–89)

## 2011-11-24 ENCOUNTER — Encounter: Payer: Self-pay | Admitting: Family Medicine

## 2011-11-24 ENCOUNTER — Ambulatory Visit (INDEPENDENT_AMBULATORY_CARE_PROVIDER_SITE_OTHER): Payer: 59 | Admitting: Family Medicine

## 2011-11-24 VITALS — BP 126/84 | HR 88 | Temp 97.3°F | Wt 139.8 lb

## 2011-11-24 DIAGNOSIS — I10 Essential (primary) hypertension: Secondary | ICD-10-CM

## 2011-11-24 DIAGNOSIS — E785 Hyperlipidemia, unspecified: Secondary | ICD-10-CM

## 2011-11-24 DIAGNOSIS — Z0000 Encounter for general adult medical examination without abnormal findings: Secondary | ICD-10-CM

## 2011-11-24 DIAGNOSIS — M899 Disorder of bone, unspecified: Secondary | ICD-10-CM

## 2011-11-24 DIAGNOSIS — M949 Disorder of cartilage, unspecified: Secondary | ICD-10-CM

## 2011-11-24 DIAGNOSIS — Z1231 Encounter for screening mammogram for malignant neoplasm of breast: Secondary | ICD-10-CM

## 2011-11-24 NOTE — Progress Notes (Signed)
Subjective:    Patient ID: Colleen Cochran, female    DOB: 05/08/1945, 66 y.o.   MRN: [id number]  HPI Here for health maintenance exam and to review chronic medical problems   Is doing ok - hanging in there   Takes fair care of herself  Not as much exercise -- since she moved into her parent's home- and trying to fix up the house   Medically nothing new going on   Had hysterectomy- total No pap -no abn paps in the past   Mammogram 3/12- is due for a mammogram at [facility] Self exam-no  Changes   Zoster status- has had vaccine --2012  Flu shot - does not get those   colonosc 8/09 - told her 5 years - polyps   Osteopenia- dexa was 4/12 On ca and D- is taking that  Calcium once daily  Extra D 1000  D level 34  Lipids -on statin Lab Results  Component Value Date   CHOL 169 11/18/2011   CHOL 161 05/05/2011   CHOL 169 06/20/2010   Lab Results  Component Value Date   HDL 47.30 11/18/2011   HDL 40.98 05/05/2011   HDL 11.91 06/20/2010   Lab Results  Component Value Date   LDLCALC 105* 11/18/2011   LDLCALC 97 05/05/2011   LDLCALC 106* 06/20/2010   Lab Results  Component Value Date   TRIG 86.0 11/18/2011   TRIG 90.0 05/05/2011   TRIG 84.0 06/20/2010   Lab Results  Component Value Date   CHOLHDL 4 11/18/2011   CHOLHDL 4 05/05/2011   CHOLHDL 4 06/20/2010   Lab Results  Component Value Date   LDLDIRECT 179.6 03/02/2009   overall well controlled - but could always be better with her diet -- too much bacon    bp in good control Today is 126/84  Wt is up 3 lb with bmi of 23  Patient Active Problem List  Diagnosis  . UNSPECIFIED VITAMIN D DEFICIENCY  . HYPERLIPIDEMIA  . HEMORRHOIDS  . SINUSITIS, RECURRENT  . ALLERGIC RHINITIS  . OSTEOPENIA  . NEPHROLITHIASIS, HX OF  . POSTMENOPAUSAL STATUS  . Hypertension  . Routine general medical examination at a health care facility   Past Medical History  Diagnosis Date  . Allergy     allergic rhinitis  . Hyperlipidemia   .  Nephrolithiasis     Hx of  . Osteopenia   . Kidney stone    Past Surgical History  Procedure Date  . Cesarean section 1969  . Dilation and curettage of uterus     x2  . Abdominal hysterectomy 1980    total for bleeding   History  Substance Use Topics  . Smoking status: Never Smoker   . Smokeless tobacco: Not on file  . Alcohol Use: No   Family History  Problem Relation Age of Onset  . Hypertension Mother   . Osteoporosis Mother   . Heart disease Father     CABG,CAD,MI  . Kidney failure Father   . Osteoporosis Father     ? of OP pt had hip fx.  . Hypertension Brother    No Known Allergies Current Outpatient Prescriptions on File Prior to Visit  Medication Sig Dispense Refill  . albuterol (VENTOLIN HFA) 108 (90 BASE) MCG/ACT inhaler Inhale 1-2 puffs into the lungs every 4 (four) hours as needed.  18 g  5  . Aspirin-Caffeine (ANACIN) 400-32 MG TABS Take 2 tablets by mouth daily. For arthritis         .  calcium carbonate (OS-CAL) 600 MG TABS Take 600 mg by mouth daily.        . cholecalciferol (VITAMIN D) 1000 UNITS tablet Take 1,000 Units by mouth daily.        . fluticasone (FLONASE) 50 MCG/ACT nasal spray Place 2 sprays into the nose daily.  16 g  5  . guaiFENesin (MUCINEX) 600 MG 12 hr tablet Take 1,200 mg by mouth 2 (two) times daily.      Marland Kitchen lisinopril (PRINIVIL,ZESTRIL) 10 MG tablet Take 1 tablet (10 mg total) by mouth daily.  30 tablet  11  . simvastatin (ZOCOR) 20 MG tablet Take 0.5 tablets (10 mg total) by mouth daily.  15 tablet  11       Review of Systems    Review of Systems  Constitutional: Negative for fever, appetite change, fatigue and unexpected weight change.  Eyes: Negative for pain and visual disturbance.  Respiratory: Negative for cough and shortness of breath.   Cardiovascular: Negative for cp or palpitations    Gastrointestinal: Negative for nausea, diarrhea and constipation.  Genitourinary: Negative for urgency and frequency.  Skin: Negative  for pallor or rash   Neurological: Negative for weakness, light-headedness, numbness and headaches.  Hematological: Negative for adenopathy. Does not bruise/bleed easily.  Psychiatric/Behavioral: Negative for dysphoric mood. The patient is not nervous/anxious.      Objective:   Physical Exam  Constitutional: She appears well-developed and well-nourished. No distress.  HENT:  Head: Normocephalic and atraumatic.  Right Ear: External ear normal.  Left Ear: External ear normal.  Nose: Nose normal.  Mouth/Throat: Oropharynx is clear and moist.  Eyes: Conjunctivae and EOM are normal. Pupils are equal, round, and reactive to light. No scleral icterus.  Neck: Normal range of motion. Neck supple. No JVD present. Carotid bruit is not present. No thyromegaly present.  Cardiovascular: Normal rate, regular rhythm, normal heart sounds and intact distal pulses.  Exam reveals no gallop.   Pulmonary/Chest: Effort normal and breath sounds normal. No respiratory distress. She has no wheezes.  Abdominal: Soft. Bowel sounds are normal. She exhibits no distension, no abdominal bruit and no mass. There is no tenderness.  Genitourinary: No breast swelling, tenderness, discharge or bleeding.       Breast exam: No mass, nodules, thickening, tenderness, bulging, retraction, inflamation, nipple discharge or skin changes noted.  No axillary or clavicular LA.  Chaperoned exam.    Musculoskeletal: Normal range of motion. She exhibits no edema and no tenderness.  Lymphadenopathy:    She has no cervical adenopathy.  Neurological: She is alert. She has normal reflexes. No cranial nerve deficit. She exhibits normal muscle tone. Coordination normal.  Skin: Skin is warm and dry. No rash noted. No erythema. No pallor.  Psychiatric: She has a normal mood and affect.          Assessment & Plan:

## 2011-11-24 NOTE — Patient Instructions (Addendum)
We will schedule annual mammogram at check out  Increase your calcium plus D pill to twice daily Also continue the vitamin D 1000 iu daily  Take care of yourself and get back to exercise

## 2011-11-24 NOTE — Assessment & Plan Note (Signed)
Overall stable on statin and diet  Disc goals for lipids and reasons to control them Rev labs with pt Rev low sat fat diet in detail

## 2011-11-24 NOTE — Assessment & Plan Note (Signed)
dexa 4/12 is up to date Rev ca and D Will inc ca plus D to bid and then continue addnl 1000 iu D daily Disc exercise No fractures

## 2011-11-24 NOTE — Assessment & Plan Note (Signed)
Scheduled annual screening mammogram Nl breast exam today  Encouraged monthly self exams   

## 2011-11-24 NOTE — Assessment & Plan Note (Signed)
Reviewed health habits including diet and exercise and skin cancer prevention Also reviewed health mt list, fam hx and immunizations   Reviewed wellness labs in detail Will start working on more exercise

## 2011-11-24 NOTE — Assessment & Plan Note (Signed)
bp in fair control at this time  No changes needed  Disc lifstyle change with low sodium diet and exercise   Reviewed labs  

## 2011-12-17 ENCOUNTER — Encounter: Payer: Self-pay | Admitting: Family Medicine

## 2011-12-17 ENCOUNTER — Ambulatory Visit: Payer: Self-pay | Admitting: Family Medicine

## 2012-05-27 ENCOUNTER — Other Ambulatory Visit: Payer: Self-pay | Admitting: Family Medicine

## 2012-06-14 IMAGING — MG MAM DGTL SCREENING MAMMO W/CAD
1 series · 4 of 4 positions shown · non-contrast
Comparison: none

REASON FOR EXAM: SCR
COMMENTS:

[R CC · right · 4 of 4 slices shown]
[im 1/4]
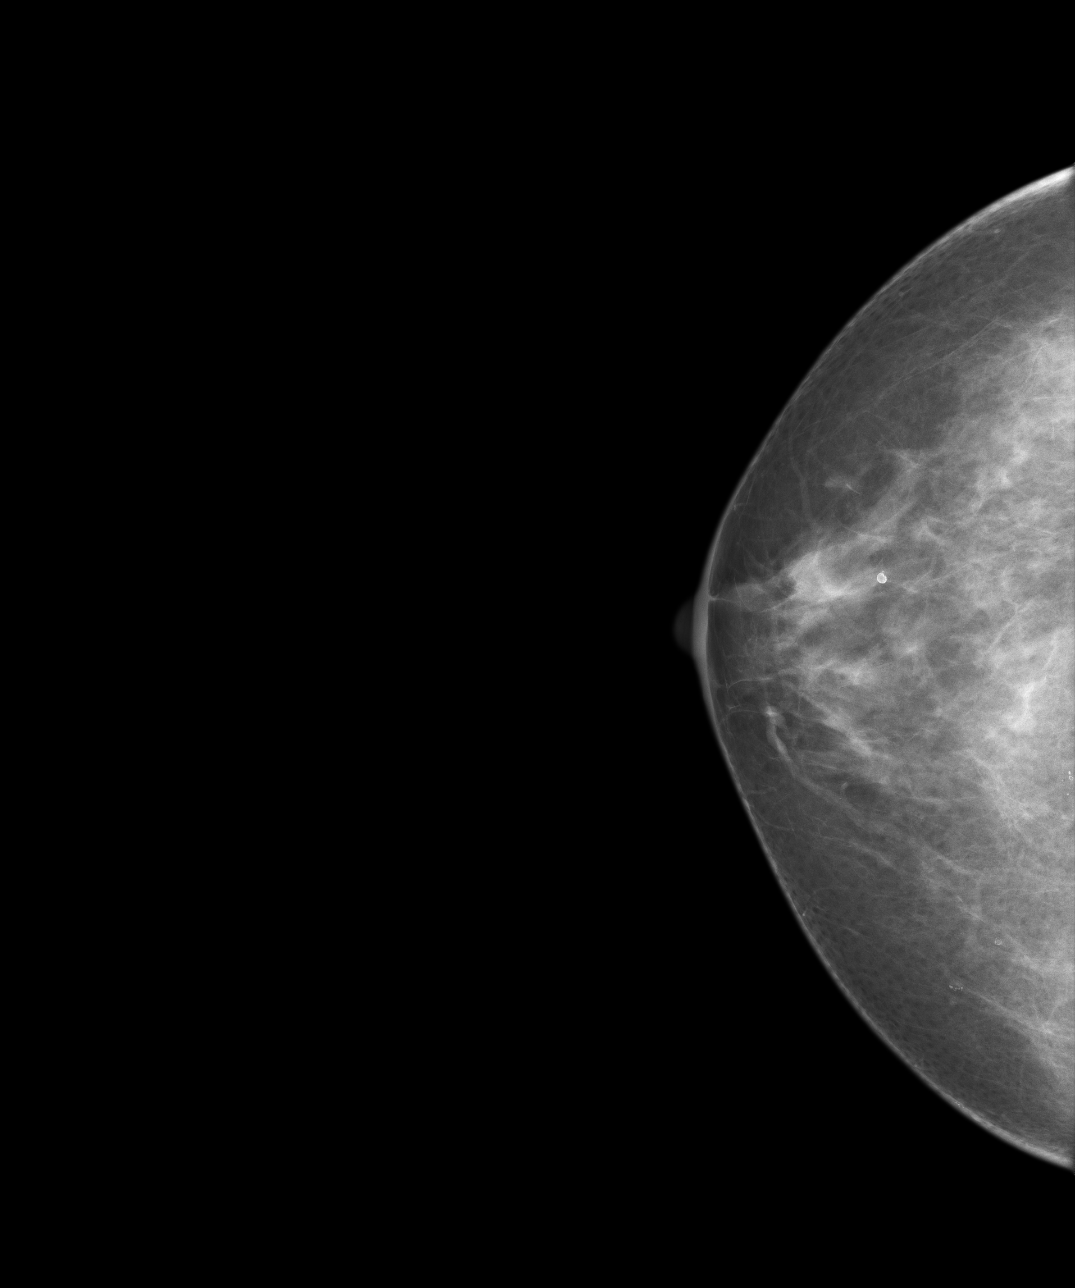
[im 2/4]
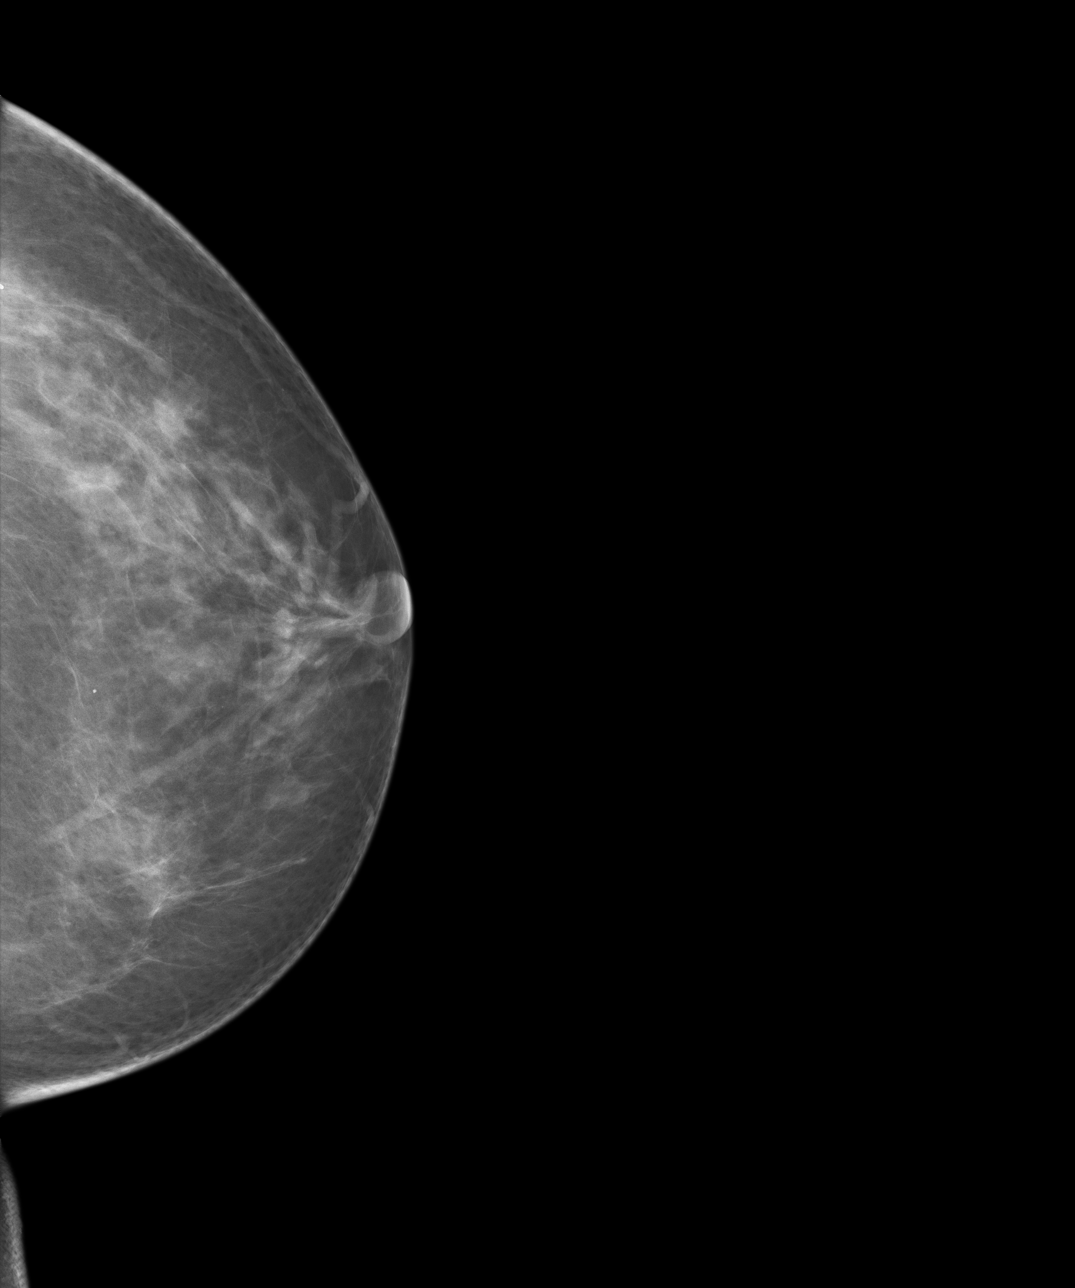
[im 3/4]
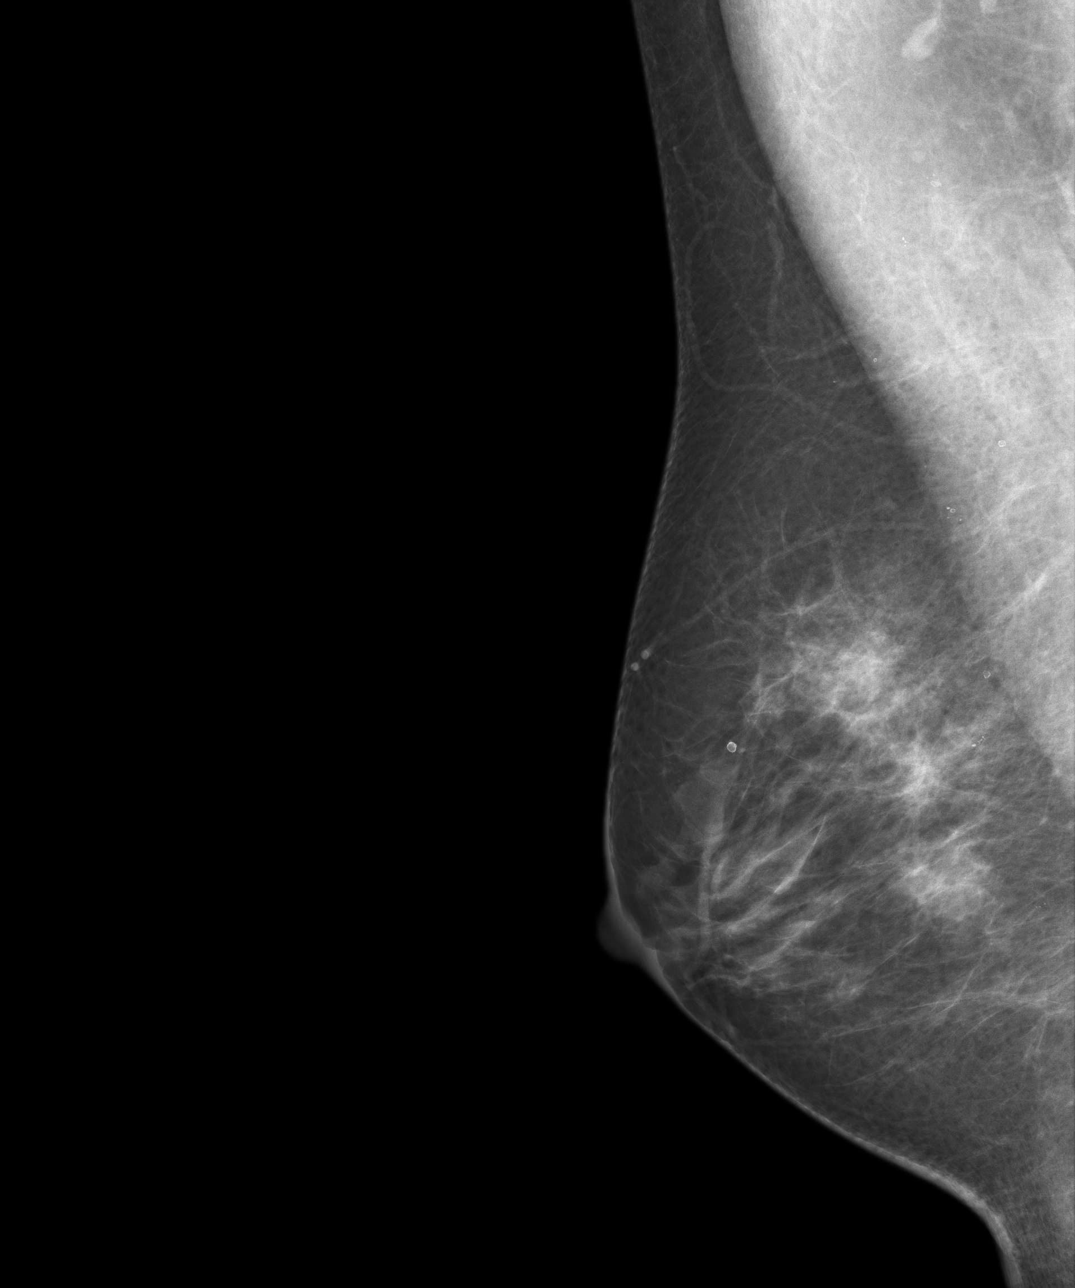
[im 4/4]
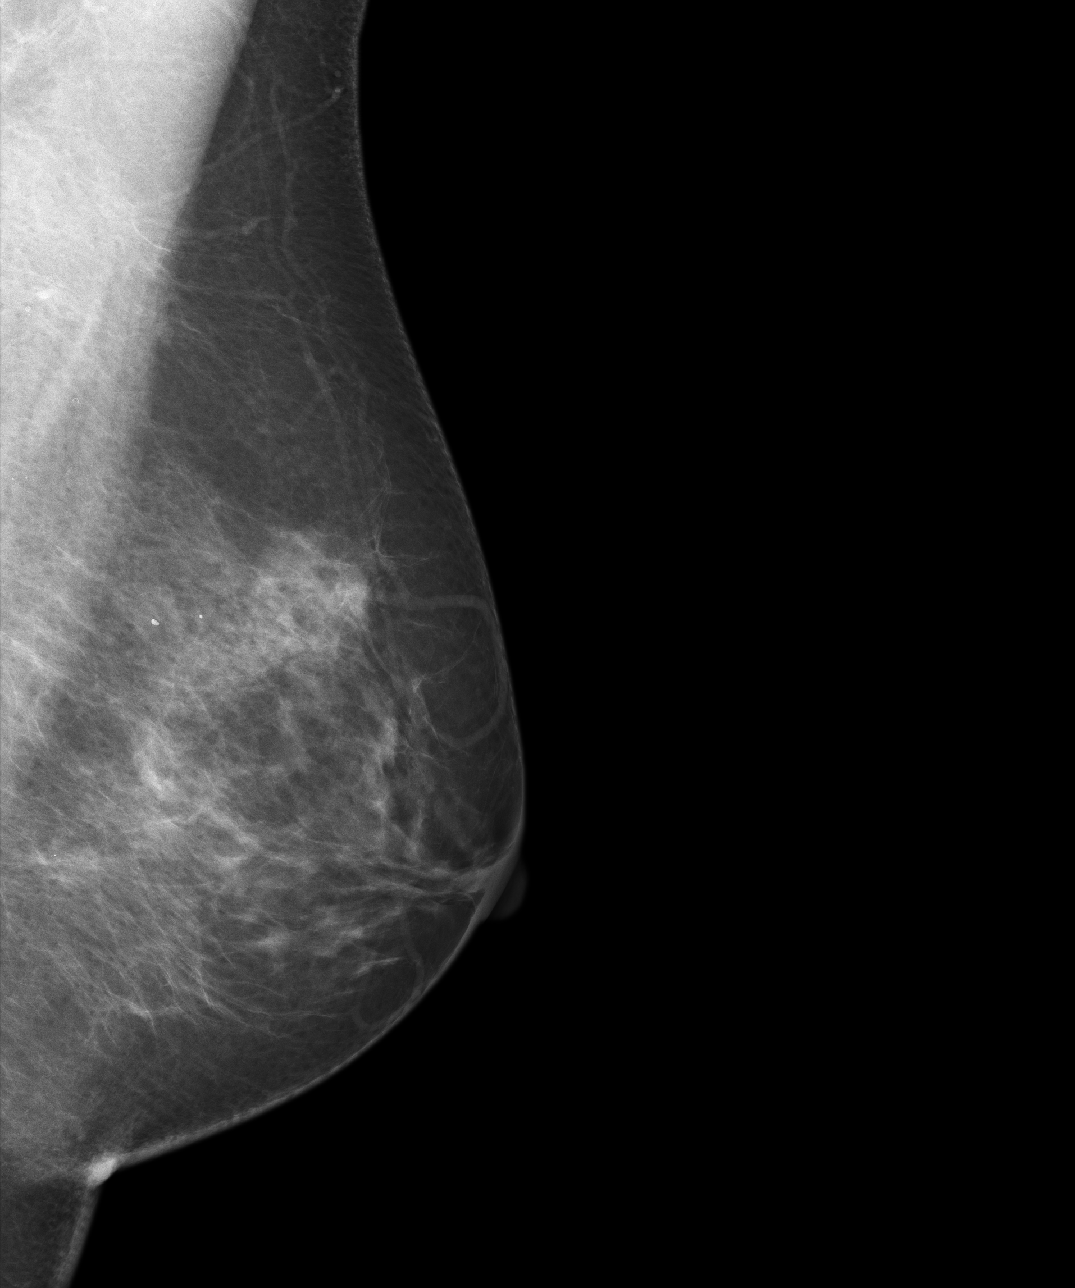

[4 of 4 positions shown; findings below may reference images not displayed]

PROCEDURE:     MAM - MAM DGTL SCREENING MAMMO W/CAD  - July 17, 2010  [DATE]

RESULT:     Comparison is made to prior studies dated 03/14/2009 from
[REDACTED] and 11/11/2007, same institution.

The breasts demonstrate a heterogeneous parenchymal pattern. There is no
radiographic evidence to suggest malignancy.
IMPRESSION: BI-RADS: Category 2 - Benign Findings

Thank you for this opportunity to contribute to the care of your patient.

A NEGATIVE MAMMOGRAM REPORT DOES NOT PRECLUDE BIOPSY OR OTHER EVALUATION OF
A CLINICALLY PALPABLE OR OTHERWISE SUSPICIOUS MASS OR LESION. BREAST CANCER
MAY NOT BE DETECTED BY MAMMOGRAPHY IN UP TO 10% OF CASES.

## 2012-11-12 ENCOUNTER — Other Ambulatory Visit: Payer: Self-pay | Admitting: Family Medicine

## 2012-11-12 NOTE — Telephone Encounter (Signed)
Done, and pt notified  

## 2012-11-12 NOTE — Telephone Encounter (Signed)
Pt left v/m requesting refill of ventolin inhaler to [facility]; pt has been using recently due to hot weather and pt would like filled before the weekend; pt request cb when sent to pharmacy.

## 2012-11-12 NOTE — Telephone Encounter (Signed)
Please refill times 3 

## 2012-11-18 ENCOUNTER — Encounter: Payer: Self-pay | Admitting: Internal Medicine

## 2013-05-29 ENCOUNTER — Other Ambulatory Visit: Payer: Self-pay | Admitting: Family Medicine

## 2013-07-11 ENCOUNTER — Encounter: Payer: Self-pay | Admitting: Internal Medicine

## 2013-09-13 ENCOUNTER — Encounter: Payer: Self-pay | Admitting: Internal Medicine

## 2013-09-13 ENCOUNTER — Ambulatory Visit (INDEPENDENT_AMBULATORY_CARE_PROVIDER_SITE_OTHER): Payer: 59 | Admitting: Internal Medicine

## 2013-09-13 VITALS — BP 136/76 | HR 88 | Temp 98.3°F | Wt 146.0 lb

## 2013-09-13 DIAGNOSIS — L299 Pruritus, unspecified: Secondary | ICD-10-CM

## 2013-09-13 DIAGNOSIS — J309 Allergic rhinitis, unspecified: Secondary | ICD-10-CM

## 2013-09-13 MED ORDER — ALBUTEROL SULFATE HFA 108 (90 BASE) MCG/ACT IN AERS: INHALATION_SPRAY | RESPIRATORY_TRACT | Status: DC

## 2013-09-13 NOTE — Progress Notes (Signed)
HPI  Pt presents to the clinic today with c/o headache, facial pain and pressure and sore throat. She reports this started 2 days ago. She does have some associated nasal congestion. She denies fever, chills or body aches but has had some fatigue. She has tried gargling with salt water and has taken OTC sinus medication wit some relief. She does have a history of allergies. She has not had sick contacts that she is aware of.  Review of Systems    Past Medical History  Diagnosis Date  . Allergy     allergic rhinitis  . Hyperlipidemia   . Nephrolithiasis     Hx of  . Osteopenia   . Kidney stone     Family History  Problem Relation Age of Onset  . Hypertension Mother   . Osteoporosis Mother   . Heart disease Father     CABG,CAD,MI  . Kidney failure Father   . Osteoporosis Father     ? of OP pt had hip fx.  . Hypertension Brother     History   Social History  . Marital Status: Married    Spouse Name: N/A    Number of Children: N/A  . Years of Education: N/A   Occupational History  . Not on file.   Social History Main Topics  . Smoking status: Never Smoker   . Smokeless tobacco: Not on file  . Alcohol Use: No  . Drug Use: Not on file  . Sexual Activity: Not on file   Other Topics Concern  . Not on file   Social History Narrative  . No narrative on file    No Known Allergies   Constitutional: Positive headache, fatigue. Denies fever or abrupt weight changes.  HEENT:  Positive facial pain, nasal congestion, left ear itching and sore throat. Denies eye redness, ear pain, ringing in the ears, wax buildup, runny nose or bloody nose. Respiratory:  Denies cough, difficulty breathing or shortness of breath.  Cardiovascular: Denies chest pain, chest tightness, palpitations or swelling in the hands or feet.   No other specific complaints in a complete review of systems (except as listed in HPI above).  Objective:  BP 136/76  Pulse 88  Temp(Src) 98.3 F (36.8 C)  (Oral)  Wt 146 lb (66.225 kg)  SpO2 98%   General: Appears her stated age, well developed, well nourished in NAD. HEENT: Head: normal shape and size, no sinus tenderness noted; Eyes: sclera white, no icterus, conjunctiva pink, PERRLA and EOMs intact; Ears: Tm's gray and intact, normal light reflex, white ring noted around TM on left; Nose: mucosa boggy and moist, septum midline; Throat/Mouth: + PND. Teeth present, mucosa pink and moist, no exudate noted, no lesions or ulcerations noted.  Neck: Neck supple, trachea midline. No massses, lumps or thyromegaly present.  Cardiovascular: Normal rate and rhythm. S1,S2 noted.  No murmur, rubs or gallops noted. No JVD or BLE edema. No carotid bruits noted. Pulmonary/Chest: Normal effort and positive vesicular breath sounds. No respiratory distress. No wheezes, rales or ronchi noted.      Assessment & Plan:   Allergic Rhinitis  Advised her to stop using the Sudafed Try allegra and flonase OTC Advised her to try peroxide 2-3 drops in the left ear to see if that helps  RTC as needed or if symptoms persist.

## 2013-09-13 NOTE — Progress Notes (Signed)
Pre visit review using our clinic review tool, if applicable. No additional management support is needed unless otherwise documented below in the visit note. 

## 2013-09-13 NOTE — Patient Instructions (Addendum)
Ear Drops, Adult  You have been diagnosed with a condition requiring you to put drops of medication into your outer ear.  HOME CARE INSTRUCTIONS   · Put drops in the affected ear as instructed. After putting the drops in, you will need to lay down with the affected ear facing up for ten minutes so the drops will remain in the ear canal and run down and fill the canal. Continue using eardrops for as long as directed by your health care provider.  · Prior to getting up, put a cotton ball gently in your ear canal. Leave enough of the ball out so it can be easily removed. Do not attempt to push this down into the canal with a cotton-tipped swab or other instrument.  · Do not irrigate or wash out your ears if you have had a perforated eardrum or mastoid surgery, or unless instructed to do so by your health care provider.  · Keep appointments with your health care provider as instructed.  · Finish all medications, or use for the length of time as instructed. Continue the drops even if your problem seems to be doing well after a couple days, or continue as instructed.  SEEK MEDICAL CARE IF:  · You become worse or develop increasing pain.  · You notice any unusual drainage from your ear (particularly if the drainage stinks).  · You develop hearing difficulties.  · You experience a serious form of dizziness in which you feel as if the room is spinning, and you feel nauseated (vertigo).  · The outside of your ear becomes red or swollen or both. This may be a sign of an allergic reaction.  MAKE SURE YOU:   · Understand these instructions.  · Will watch your condition.  · Will get help right away if you are not doing well or get worse.  Document Released: 04/01/2001 Document Revised: 01/26/2013 Document Reviewed: 11/02/2012  [employer]® Patient Information ©2014 [employer].

## 2013-09-21 ENCOUNTER — Telehealth: Payer: Self-pay | Admitting: Family Medicine

## 2013-09-21 DIAGNOSIS — E559 Vitamin D deficiency, unspecified: Secondary | ICD-10-CM

## 2013-09-21 DIAGNOSIS — E785 Hyperlipidemia, unspecified: Secondary | ICD-10-CM

## 2013-09-21 DIAGNOSIS — I10 Essential (primary) hypertension: Secondary | ICD-10-CM

## 2013-09-21 DIAGNOSIS — M949 Disorder of cartilage, unspecified: Secondary | ICD-10-CM

## 2013-09-21 DIAGNOSIS — M899 Disorder of bone, unspecified: Secondary | ICD-10-CM

## 2013-09-21 NOTE — Telephone Encounter (Signed)
Message copied by Judy Pimple on Wed Sep 21, 2013 10:06 PM ------      Message from: Baldomero Lamy      Created: Mon Sep 19, 2013 10:14 AM      Regarding: Cpx labs 09/23/13       Please order  future cpx labs for pt's upcoming lab appt.      Thanks      Tasha       ------

## 2013-09-23 ENCOUNTER — Other Ambulatory Visit (INDEPENDENT_AMBULATORY_CARE_PROVIDER_SITE_OTHER): Payer: 59

## 2013-09-23 DIAGNOSIS — M949 Disorder of cartilage, unspecified: Secondary | ICD-10-CM

## 2013-09-23 DIAGNOSIS — E785 Hyperlipidemia, unspecified: Secondary | ICD-10-CM

## 2013-09-23 DIAGNOSIS — E559 Vitamin D deficiency, unspecified: Secondary | ICD-10-CM

## 2013-09-23 DIAGNOSIS — M899 Disorder of bone, unspecified: Secondary | ICD-10-CM

## 2013-09-23 DIAGNOSIS — I10 Essential (primary) hypertension: Secondary | ICD-10-CM

## 2013-09-23 LAB — COMPREHENSIVE METABOLIC PANEL
ALBUMIN: 4.3 g/dL (ref 3.5–5.2)
ALT: 23 U/L (ref 0–35)
AST: 29 U/L (ref 0–37)
Alkaline Phosphatase: 54 U/L (ref 39–117)
BUN: 14 mg/dL (ref 6–23)
CO2: 29 mEq/L (ref 19–32)
Calcium: 9.5 mg/dL (ref 8.4–10.5)
Chloride: 106 mEq/L (ref 96–112)
Creatinine, Ser: 0.7 mg/dL (ref 0.4–1.2)
GFR: 95.02 mL/min (ref >60.00)
GLUCOSE: 93 mg/dL (ref 70–99)
POTASSIUM: 4.3 meq/L (ref 3.5–5.1)
Sodium: 140 mEq/L (ref 135–145)
Total Bilirubin: 0.5 mg/dL (ref 0.2–1.2)
Total Protein: 7.6 g/dL (ref 6.0–8.3)

## 2013-09-23 LAB — TSH: TSH: 0.77 u[IU]/mL (ref 0.35–4.50)

## 2013-09-23 LAB — CBC WITH DIFFERENTIAL/PLATELET
Basophils Absolute: 0 10*3/uL (ref 0.0–0.1)
Basophils Relative: 0.6 % (ref 0.0–3.0)
EOS PCT: 2.6 % (ref 0.0–5.0)
Eosinophils Absolute: 0.1 10*3/uL (ref 0.0–0.7)
HCT: 40.7 % (ref 36.0–46.0)
Hemoglobin: 13.6 g/dL (ref 12.0–15.0)
LYMPHS PCT: 24.8 % (ref 12.0–46.0)
Lymphs Abs: 1.4 10*3/uL (ref 0.7–4.0)
MCHC: 33.4 g/dL (ref 30.0–36.0)
MCV: 90 fl (ref 78.0–100.0)
Monocytes Absolute: 0.5 10*3/uL (ref 0.1–1.0)
Monocytes Relative: 8.8 % (ref 3.0–12.0)
NEUTROS ABS: 3.5 10*3/uL (ref 1.4–7.7)
NEUTROS PCT: 63.2 % (ref 43.0–77.0)
Platelets: 305 10*3/uL (ref 150.0–400.0)
RBC: 4.53 Mil/uL (ref 3.87–5.11)
RDW: 13.4 % (ref 11.5–15.5)
WBC: 5.6 10*3/uL (ref 4.0–10.5)

## 2013-09-23 LAB — LIPID PANEL
Cholesterol: 171 mg/dL (ref 0–200)
HDL: 47 mg/dL (ref >39.00)
LDL Cholesterol: 102 mg/dL — ABNORMAL HIGH (ref 0–99)
NonHDL: 124
Total CHOL/HDL Ratio: 4
Triglycerides: 110 mg/dL (ref 0.0–149.0)
VLDL: 22 mg/dL (ref 0.0–40.0)

## 2013-09-23 LAB — VITAMIN D 25 HYDROXY (VIT D DEFICIENCY, FRACTURES): VITD: 47.2 ng/mL

## 2013-09-30 ENCOUNTER — Encounter: Payer: Self-pay | Admitting: Family Medicine

## 2013-09-30 ENCOUNTER — Ambulatory Visit (INDEPENDENT_AMBULATORY_CARE_PROVIDER_SITE_OTHER): Payer: Medicare Other | Admitting: Family Medicine

## 2013-09-30 ENCOUNTER — Encounter: Payer: Self-pay | Admitting: Internal Medicine

## 2013-09-30 VITALS — BP 140/82 | HR 86 | Temp 99.1°F | Ht 65.0 in | Wt 146.5 lb

## 2013-09-30 DIAGNOSIS — Z23 Encounter for immunization: Secondary | ICD-10-CM

## 2013-09-30 DIAGNOSIS — Z860100 Personal history of colon polyps, unspecified: Secondary | ICD-10-CM

## 2013-09-30 DIAGNOSIS — M899 Disorder of bone, unspecified: Secondary | ICD-10-CM

## 2013-09-30 DIAGNOSIS — Z0000 Encounter for general adult medical examination without abnormal findings: Secondary | ICD-10-CM

## 2013-09-30 DIAGNOSIS — M949 Disorder of cartilage, unspecified: Secondary | ICD-10-CM

## 2013-09-30 DIAGNOSIS — E785 Hyperlipidemia, unspecified: Secondary | ICD-10-CM

## 2013-09-30 DIAGNOSIS — I10 Essential (primary) hypertension: Secondary | ICD-10-CM

## 2013-09-30 DIAGNOSIS — Z8249 Family history of ischemic heart disease and other diseases of the circulatory system: Secondary | ICD-10-CM

## 2013-09-30 DIAGNOSIS — E559 Vitamin D deficiency, unspecified: Secondary | ICD-10-CM

## 2013-09-30 MED ORDER — SIMVASTATIN 20 MG PO TABS: ORAL_TABLET | ORAL | Status: DC

## 2013-09-30 MED ORDER — LISINOPRIL 10 MG PO TABS: ORAL_TABLET | ORAL | Status: DC

## 2013-09-30 NOTE — Progress Notes (Signed)
Pre visit review using our clinic review tool, if applicable. No additional management support is needed unless otherwise documented below in the visit note. 

## 2013-09-30 NOTE — Assessment & Plan Note (Signed)
Vitamin D level is therapeutic with current supplementation Disc importance of this to bone and overall health  

## 2013-09-30 NOTE — Patient Instructions (Signed)
Don't forget to schedule your mammogram  Stop at check out for ref for abd ultrasound and colonoscopy  Please consider working on a living will  Take care of yourself     Fat and Cholesterol Control Diet Fat and cholesterol levels in your blood and organs are influenced by your diet. High levels of fat and cholesterol may lead to diseases of the heart, small and large blood vessels, gallbladder, liver, and pancreas. CONTROLLING FAT AND CHOLESTEROL WITH DIET Although exercise and lifestyle factors are important, your diet is key. That is because certain foods are known to raise cholesterol and others to lower it. The goal is to balance foods for their effect on cholesterol and more importantly, to replace saturated and trans fat with other types of fat, such as monounsaturated fat, polyunsaturated fat, and omega-3 fatty acids. On average, a person should consume no more than 15 to 17 g of saturated fat daily. Saturated and trans fats are considered "bad" fats, and they will raise LDL cholesterol. Saturated fats are primarily found in animal products such as meats, butter, and cream. However, that does not mean you need to give up all your favorite foods. Today, there are good tasting, low-fat, low-cholesterol substitutes for most of the things you like to eat. Choose low-fat or nonfat alternatives. Choose round or loin cuts of red meat. These types of cuts are lowest in fat and cholesterol. Chicken (without the skin), fish, veal, and ground [place] breast are great choices. Eliminate fatty meats, such as hot dogs and salami. Even shellfish have little or no saturated fat. Have a 3 oz (85 g) portion when you eat lean meat, poultry, or fish. Trans fats are also called "partially hydrogenated oils." They are oils that have been scientifically manipulated so that they are solid at room temperature resulting in a longer shelf life and improved taste and texture of foods in which they are added. Trans fats are  found in stick margarine, some tub margarines, cookies, crackers, and baked goods.  When baking and cooking, oils are a great substitute for butter. The monounsaturated oils are especially beneficial since it is believed they lower LDL and raise HDL. The oils you should avoid entirely are saturated tropical oils, such as coconut and palm.  Remember to eat a lot from food groups that are naturally free of saturated and trans fat, including fish, fruit, vegetables, beans, grains (barley, rice, couscous, bulgur wheat), and pasta (without cream sauces).  IDENTIFYING FOODS THAT LOWER FAT AND CHOLESTEROL  Soluble fiber may lower your cholesterol. This type of fiber is found in fruits such as apples, vegetables such as broccoli, potatoes, and carrots, legumes such as beans, peas, and lentils, and grains such as barley. Foods fortified with plant sterols (phytosterol) may also lower cholesterol. You should eat at least 2 g per day of these foods for a cholesterol lowering effect.  Read package labels to identify low-saturated fats, trans fat free, and low-fat foods at the supermarket. Select cheeses that have only 2 to 3 g saturated fat per ounce. Use a heart-healthy tub margarine that is free of trans fats or partially hydrogenated oil. When buying baked goods (cookies, crackers), avoid partially hydrogenated oils. Breads and muffins should be made from whole grains (whole-wheat or whole oat flour, instead of "flour" or "enriched flour"). Buy non-creamy canned soups with reduced salt and no added fats.  FOOD PREPARATION TECHNIQUES  Never deep-fry. If you must fry, either stir-fry, which uses very little fat, or use  non-stick cooking sprays. When possible, broil, bake, or roast meats, and steam vegetables. Instead of putting butter or margarine on vegetables, use lemon and herbs, applesauce, and cinnamon (for squash and sweet potatoes). Use nonfat yogurt, salsa, and low-fat dressings for salads.  LOW-SATURATED FAT /  LOW-FAT FOOD SUBSTITUTES Meats / Saturated Fat (g)  Avoid: Steak, marbled (3 oz/85 g) / 11 g  Choose: Steak, lean (3 oz/85 g) / 4 g  Avoid: Hamburger (3 oz/85 g) / 7 g  Choose: Hamburger, lean (3 oz/85 g) / 5 g  Avoid: Ham (3 oz/85 g) / 6 g  Choose: Ham, lean cut (3 oz/85 g) / 2.4 g  Avoid: Chicken, with skin, dark meat (3 oz/85 g) / 4 g  Choose: Chicken, skin removed, dark meat (3 oz/85 g) / 2 g  Avoid: Chicken, with skin, light meat (3 oz/85 g) / 2.5 g  Choose: Chicken, skin removed, light meat (3 oz/85 g) / 1 g Dairy / Saturated Fat (g)  Avoid: Whole milk (1 cup) / 5 g  Choose: Low-fat milk, 2% (1 cup) / 3 g  Choose: Low-fat milk, 1% (1 cup) / 1.5 g  Choose: Skim milk (1 cup) / 0.3 g  Avoid: Hard cheese (1 oz/28 g) / 6 g  Choose: Skim milk cheese (1 oz/28 g) / 2 to 3 g  Avoid: Cottage cheese, 4% fat (1 cup) / 6.5 g  Choose: Low-fat cottage cheese, 1% fat (1 cup) / 1.5 g  Avoid: Ice cream (1 cup) / 9 g  Choose: Sherbet (1 cup) / 2.5 g  Choose: Nonfat frozen yogurt (1 cup) / 0.3 g  Choose: Frozen fruit bar / trace  Avoid: Whipped cream (1 tbs) / 3.5 g  Choose: Nondairy whipped topping (1 tbs) / 1 g Condiments / Saturated Fat (g)  Avoid: Mayonnaise (1 tbs) / 2 g  Choose: Low-fat mayonnaise (1 tbs) / 1 g  Avoid: Butter (1 tbs) / 7 g  Choose: Extra light margarine (1 tbs) / 1 g  Avoid: Coconut oil (1 tbs) / 11.8 g  Choose: Olive oil (1 tbs) / 1.8 g  Choose: Corn oil (1 tbs) / 1.7 g  Choose: Safflower oil (1 tbs) / 1.2 g  Choose: Sunflower oil (1 tbs) / 1.4 g  Choose: Soybean oil (1 tbs) / 2.4 g  Choose: Canola oil (1 tbs) / 1 g Document Released: 04/07/2005 Document Revised: 08/02/2012 Document Reviewed: 09/26/2010 [employer] Patient Information 2014 [employer] [place].

## 2013-09-30 NOTE — Assessment & Plan Note (Signed)
Ref for 5 y recall colonosc

## 2013-09-30 NOTE — Assessment & Plan Note (Signed)
abd us sched for screening  M and B have had AAA

## 2013-09-30 NOTE — Assessment & Plan Note (Signed)
bp in fair control at this time  BP Readings from Last 1 Encounters:  09/30/13 140/82  (this runs lower at home-some whitecoat eff) No changes needed Disc lifstyle change with low sodium diet and exercise  Labs rev

## 2013-09-30 NOTE — Progress Notes (Signed)
Subjective:    Patient ID: Colleen Cochran, female    DOB: December 28, 1945, 68 y.o.   MRN: [id number]  HPI I have personally reviewed the Medicare Annual Wellness questionnaire and have noted 1. The patient's medical and social history 2. Their use of alcohol, tobacco or illicit drugs 3. Their current medications and supplements 4. The patient's functional ability including ADL's, fall risks, home safety risks and hearing or visual             impairment. 5. Diet and physical activities 6. Evidence for depression or mood disorders  The patients weight, height, BMI have been recorded in the chart and visual acuity is per eye clinic.  I have made referrals, counseling and provided education to the patient based review of the above and I have provided the pt with a written personalized care plan for preventive services.  Doing ok overall    See scanned forms.  Routine anticipatory guidance given to patient.  See health maintenance. Colon cancer screening 8/09 -polyps - overdue for that - will refer for colonosc  Breast cancer screening 8/13 - will make own appt at [facility] Self breast exam- no lumps  Flu vaccine did not get this season  Tetanus vaccine 6/09  Pneumovax - has not had - will do that today  Zoster vaccine- had the vaccine in the past 6/12   Advance directive does not have a living will - given packet  Cognitive function addressed- see scanned forms- and if abnormal then additional documentation follows. -no issues with memory   PMH and SH reviewed  Meds, vitals, and allergies reviewed.   ROS: See HPI.  Otherwise negative.    Osteopenia  dexa 4/12 - will put off one more year  No fractures and no falls  Taking ca and D D level is 47   Results for orders placed in visit on 09/23/13  CBC WITH DIFFERENTIAL      Result Value Ref Range   WBC 5.6  4.0 - 10.5 K/uL   RBC 4.53  3.87 - 5.11 Mil/uL   Hemoglobin 13.6  12.0 - 15.0 g/dL   HCT 40.9  81.1 - 91.4 %   MCV 90.0  78.0  - 100.0 fl   MCHC 33.4  30.0 - 36.0 g/dL   RDW 78.2  95.6 - 21.3 %   Platelets 305.0  150.0 - 400.0 K/uL   Neutrophils Relative % 63.2  43.0 - 77.0 %   Lymphocytes Relative 24.8  12.0 - 46.0 %   Monocytes Relative 8.8  3.0 - 12.0 %   Eosinophils Relative 2.6  0.0 - 5.0 %   Basophils Relative 0.6  0.0 - 3.0 %   Neutro Abs 3.5  1.4 - 7.7 K/uL   Lymphs Abs 1.4  0.7 - 4.0 K/uL   Monocytes Absolute 0.5  0.1 - 1.0 K/uL   Eosinophils Absolute 0.1  0.0 - 0.7 K/uL   Basophils Absolute 0.0  0.0 - 0.1 K/uL  COMPREHENSIVE METABOLIC PANEL      Result Value Ref Range   Sodium 140  135 - 145 mEq/L   Potassium 4.3  3.5 - 5.1 mEq/L   Chloride 106  96 - 112 mEq/L   CO2 29  19 - 32 mEq/L   Glucose, Bld 93  70 - 99 mg/dL   BUN 14  6 - 23 mg/dL   Creatinine, Ser 0.7  0.4 - 1.2 mg/dL   Total Bilirubin 0.5  0.2 - 1.2 mg/dL  Alkaline Phosphatase 54  39 - 117 U/L   AST 29  0 - 37 U/L   ALT 23  0 - 35 U/L   Total Protein 7.6  6.0 - 8.3 g/dL   Albumin 4.3  3.5 - 5.2 g/dL   Calcium 9.5  8.4 - 91.410.5 mg/dL   GFR 78.2995.02  >56.21>60.00 mL/min  LIPID PANEL      Result Value Ref Range   Cholesterol 171  0 - 200 mg/dL   Triglycerides 308.6110.0  0.0 - 149.0 mg/dL   HDL 57.8447.00  >69.62>39.00 mg/dL   VLDL 95.222.0  0.0 - 84.140.0 mg/dL   LDL Cholesterol 324102 (*) 0 - 99 mg/dL   Total CHOL/HDL Ratio 4     NonHDL 124.00    TSH      Result Value Ref Range   TSH 0.77  0.35 - 4.50 uIU/mL  VITAMIN D 25 HYDROXY      Result Value Ref Range   VITD 47.20        Patient Active Problem List   Diagnosis Date Noted  . Encounter for Medicare annual wellness exam 09/30/2013  . Family history of aortic aneurysm 09/30/2013  . History of colon polyps 09/30/2013  . Other screening mammogram 11/24/2011  . Routine general medical examination at a health care facility 11/17/2011  . Hypertension 09/24/2010  . POSTMENOPAUSAL STATUS 06/24/2010  . UNSPECIFIED VITAMIN D DEFICIENCY 03/02/2009  . HEMORRHOIDS 11/04/2007  . HYPERLIPIDEMIA 10/05/2007  .  SINUSITIS, RECURRENT 10/05/2007  . ALLERGIC RHINITIS 10/05/2007  . OSTEOPENIA 10/05/2007  . NEPHROLITHIASIS, HX OF 10/05/2007   Past Medical History  Diagnosis Date  . Allergy     allergic rhinitis  . Hyperlipidemia   . Nephrolithiasis     Hx of  . Osteopenia   . Kidney stone    Past Surgical History  Procedure Laterality Date  . Cesarean section  1969  . Dilation and curettage of uterus      x2  . Abdominal hysterectomy  1980    total for bleeding   History  Substance Use Topics  . Smoking status: Never Smoker   . Smokeless tobacco: Not on file  . Alcohol Use: No   Family History  Problem Relation Age of Onset  . Hypertension Mother   . Osteoporosis Mother   . Heart disease Father     CABG,CAD,MI  . Kidney failure Father   . Osteoporosis Father     ? of OP pt had hip fx.  . Hypertension Brother    No Known Allergies Current Outpatient Prescriptions on File Prior to Visit  Medication Sig Dispense Refill  . albuterol (VENTOLIN HFA) 108 (90 BASE) MCG/ACT inhaler inhale 1 to 2 puffs INTO THE LUNGS every 4 hours if needed  18 g  2  . Calcium Citrate-Vitamin D (CALCIUM CITRATE + D3 PO) Take 1 capsule by mouth.      . cholecalciferol (VITAMIN D) 1000 UNITS tablet Take 1,000 Units by mouth daily.         No current facility-administered medications on file prior to visit.    Review of Systems Review of Systems  Constitutional: Negative for fever, appetite change, fatigue and unexpected weight change.  Eyes: Negative for pain and visual disturbance.  Respiratory: Negative for cough and shortness of breath.   Cardiovascular: Negative for cp or palpitations    Gastrointestinal: Negative for nausea, diarrhea and constipation.  Genitourinary: Negative for urgency and frequency.  Skin: Negative for pallor or rash  Neurological: Negative for weakness, light-headedness, numbness and headaches.  Hematological: Negative for adenopathy. Does not bruise/bleed easily.    Psychiatric/Behavioral: Negative for dysphoric mood. The patient is not nervous/anxious.         Objective:   Physical Exam  Constitutional: She appears well-developed and well-nourished. No distress.  HENT:  Head: Normocephalic and atraumatic.  Right Ear: External ear normal.  Left Ear: External ear normal.  Nose: Nose normal.  Mouth/Throat: Oropharynx is clear and moist.  Eyes: Conjunctivae and EOM are normal. Pupils are equal, round, and reactive to light. Right eye exhibits no discharge. Left eye exhibits no discharge. No scleral icterus.  Neck: Normal range of motion. Neck supple. No JVD present. No thyromegaly present.  Cardiovascular: Normal rate, regular rhythm, normal heart sounds and intact distal pulses.  Exam reveals no gallop.   Pulmonary/Chest: Effort normal and breath sounds normal. No respiratory distress. She has no wheezes. She has no rales.  Abdominal: Soft. Bowel sounds are normal. She exhibits no distension and no mass. There is no tenderness.  Genitourinary: No breast swelling, tenderness, discharge or bleeding.  Breast exam: No mass, nodules, thickening, tenderness, bulging, retraction, inflamation, nipple discharge or skin changes noted.  No axillary or clavicular LA.      Musculoskeletal: She exhibits no edema and no tenderness.  No kyphosis   Lymphadenopathy:    She has no cervical adenopathy.  Neurological: She is alert. She has normal reflexes. No cranial nerve deficit. She exhibits normal muscle tone. Coordination normal.  Skin: Skin is warm and dry. No rash noted. No erythema. No pallor.  Psychiatric: She has a normal mood and affect.          Assessment & Plan:   Problem List Items Addressed This Visit     Cardiovascular and Mediastinum   Hypertension - Primary      bp in fair control at this time  BP Readings from Last 1 Encounters:  09/30/13 140/82  (this runs lower at home-some whitecoat eff) No changes needed Disc lifstyle change with  low sodium diet and exercise  Labs rev      Relevant Medications      aspirin 81 MG tablet      lisinopril (PRINIVIL,ZESTRIL) tablet      simvastatin (ZOCOR) tablet     Musculoskeletal and Integument   OSTEOPENIA     Pt wants to put off dexa another year  No fx Nl D level Disc need for calcium/ vitamin D/ wt bearing exercise and bone density test every 2 y to monitor Disc safety/ fracture risk in detail        Other   UNSPECIFIED VITAMIN D DEFICIENCY     Vitamin D level is therapeutic with current supplementation Disc importance of this to bone and overall health     HYPERLIPIDEMIA     Disc goals for lipids and reasons to control them Rev labs with pt Rev low sat fat diet in detail Statin and diet     Relevant Medications      aspirin 81 MG tablet      lisinopril (PRINIVIL,ZESTRIL) tablet      simvastatin (ZOCOR) tablet   Encounter for Medicare annual wellness exam     Reviewed health habits including diet and exercise and skin cancer prevention Reviewed appropriate screening tests for age  Also reviewed health mt list, fam hx and immunization status , as well as social and family history   See HPI Will work on living will Will  sched mammo and colonosc Pneumovax today    Family history of aortic aneurysm     abd [place] sched for screening  M and B have had AAA    Relevant Orders      US Abdomen Complete   History of colon polyps     Ref for 5 y recall colonosc     Relevant Orders      Ambulatory referral to Gastroenterology    Other Visit Diagnoses   Need for 23-polyvalent pneumococcal polysaccharide vaccine        Relevant Orders       Pneumococcal polysaccharide vaccine 23-valent greater than or equal to 2yo subcutaneous/IM (Completed)

## 2013-09-30 NOTE — Assessment & Plan Note (Signed)
Reviewed health habits including diet and exercise and skin cancer prevention Reviewed appropriate screening tests for age  Also reviewed health mt list, fam hx and immunization status , as well as social and family history   See HPI Will work on living will Will sched mammo and colonosc Pneumovax today

## 2013-09-30 NOTE — Assessment & Plan Note (Signed)
Disc goals for lipids and reasons to control them Rev labs with pt Rev low sat fat diet in detail Statin and diet  

## 2013-09-30 NOTE — Assessment & Plan Note (Signed)
Pt wants to put off dexa another year  No fx Nl D level Disc need for calcium/ vitamin D/ wt bearing exercise and bone density test every 2 y to monitor Disc safety/ fracture risk in detail

## 2013-10-01 ENCOUNTER — Telehealth: Payer: Self-pay | Admitting: Family Medicine

## 2013-10-01 NOTE — Telephone Encounter (Signed)
Relevant patient education assigned to patient using Emmi. ° °

## 2013-10-03 ENCOUNTER — Telehealth: Payer: Self-pay

## 2013-10-03 MED ORDER — LISINOPRIL 10 MG PO TABS: 10.0000 mg | ORAL_TABLET | Freq: Every day | ORAL | Status: DC

## 2013-10-03 NOTE — Telephone Encounter (Signed)
Pt notified Rx was sent to pharmacy. 

## 2013-10-03 NOTE — Telephone Encounter (Signed)
Pt was seen on 09/30/13 and [facility] [address] the instructions were changed to 1/2 tab daily. Pt has been taking Lisinopril 10 mg one tab daily. Pt request cb.

## 2013-10-03 NOTE — Telephone Encounter (Signed)
That is how it is listed in the chart  I will send new px elect

## 2013-10-07 ENCOUNTER — Telehealth: Payer: Self-pay

## 2013-10-07 ENCOUNTER — Ambulatory Visit: Payer: Self-pay | Admitting: Family Medicine

## 2013-10-07 NOTE — Telephone Encounter (Signed)
Pt left v/m; pt had abdominal aortic aneurysm screening; technician that did test told pt Dr Milinda Antisower would have results by lunchtime today and pt request cb with results.Please advise.

## 2013-10-07 NOTE — Telephone Encounter (Signed)
I don't see the results yet. We'll notify her when they are available. Thanks.

## 2013-10-10 ENCOUNTER — Encounter: Payer: Self-pay | Admitting: Family Medicine

## 2013-10-10 NOTE — Telephone Encounter (Signed)
See prev note.   Results are in EPIC, please advise

## 2013-10-10 NOTE — Telephone Encounter (Signed)
Pt notified of Dr. Lianne Bushyuncan's comments. Pt was concerned about the renal cyst and wanted me forward this message to Dr. Milinda Antisower, pt aware that Dr. Milinda Antisower isn't back until 10/18/13, but she wanted to make sure Dr. Milinda Antisower aware of results and wanted Dr. Royden Purlower's input on the renal cyst. Pt wanted to know if that is something she needs to f/u on, and wanted Dr. Royden Purlower's opinion on how she thinks pt developed the renal cyst

## 2013-10-10 NOTE — Telephone Encounter (Signed)
I do not think this is anything we need to follow up on - this is common - we can discuss it at her next follow up

## 2013-10-10 NOTE — Telephone Encounter (Signed)
Pt left v/m requesting cb about abd aortic screening test done.Please advise.

## 2013-10-10 NOTE — Telephone Encounter (Signed)
Notify pt.  No AAA. Incidental renal cyst.  Routed to PCP as FYI.

## 2013-10-13 NOTE — Telephone Encounter (Signed)
Left voicemail requesting pt to call office back 

## 2013-10-13 NOTE — Telephone Encounter (Signed)
Pt notified of Dr. Royden Purlower's comment

## 2013-10-25 ENCOUNTER — Ambulatory Visit: Payer: Self-pay | Admitting: Family Medicine

## 2013-10-26 ENCOUNTER — Encounter: Payer: Self-pay | Admitting: Family Medicine

## 2013-11-28 ENCOUNTER — Ambulatory Visit (AMBULATORY_SURGERY_CENTER): Payer: Medicare Other

## 2013-11-28 VITALS — Ht 66.0 in | Wt 145.0 lb

## 2013-11-28 DIAGNOSIS — Z860100 Personal history of colon polyps, unspecified: Secondary | ICD-10-CM

## 2013-11-28 MED ORDER — SUPREP BOWEL PREP KIT 17.5-3.13-1.6 GM/177ML PO SOLN: 1.0000 | Freq: Once | ORAL | Status: DC

## 2013-11-28 NOTE — Progress Notes (Signed)
No allergies to eggs or soy No past problems with anesthesia No home oxygen No diet/weight loss meds  Has email  Emmi instructions given for colonoscopy 

## 2013-12-12 ENCOUNTER — Ambulatory Visit (AMBULATORY_SURGERY_CENTER): Payer: Medicare Other | Admitting: Internal Medicine

## 2013-12-12 ENCOUNTER — Encounter: Payer: Self-pay | Admitting: Internal Medicine

## 2013-12-12 VITALS — BP 144/85 | HR 74 | Temp 98.9°F | Resp 17 | Ht 66.0 in | Wt 145.0 lb

## 2013-12-12 DIAGNOSIS — K5730 Diverticulosis of large intestine without perforation or abscess without bleeding: Secondary | ICD-10-CM

## 2013-12-12 DIAGNOSIS — Z86010 Personal history of colonic polyps: Secondary | ICD-10-CM

## 2013-12-12 MED ORDER — SODIUM CHLORIDE 0.9 % IV SOLN: 500.0000 mL | INTRAVENOUS | Status: DC

## 2013-12-12 NOTE — Progress Notes (Signed)
BP manually taken in left ARM, 150/88.

## 2013-12-12 NOTE — Op Note (Signed)
[facility] [address] [place], [address]   COLONOSCOPY PROCEDURE REPORT  PATIENT: Colleen Cochran, Colleen Cochran  MR#: [id number] BIRTHDATE: 05-May-1945 , 67  yrs. old GENDER: Female ENDOSCOPIST: Iva Boop, MD, [facility] PROCEDURE DATE:  12/12/2013 PROCEDURE:   Colonoscopy, surveillance First Screening Colonoscopy - Avg.  risk and is 50 yrs.  old or older - No.  Prior Negative Screening - Now for repeat screening. N/A  History of Adenoma - Now for follow-up colonoscopy & has been > or = to 3 yrs.  Yes hx of adenoma.  Has been 3 or more years since last colonoscopy.  Polyps Removed Today? No.  Recommend repeat exam, <10 yrs? No. ASA CLASS:   Class II INDICATIONS:Patient's personal history of adenomatous colon polyps.  MEDICATIONS: propofol (Diprivan)  IV, MAC sedation, administered by CRNA, and These medications were titrated to patient response per physician's verbal order  DESCRIPTION OF PROCEDURE:   After the risks benefits and alternatives of the procedure were thoroughly explained, informed consent was obtained.  A digital rectal exam revealed no abnormalities of the rectum.   The LB [id number]  endoscope was introduced through the anus and advanced to the cecum, which was identified by both the appendix and ileocecal valve. No adverse events experienced.   The quality of the prep was excellent using Suprep  The instrument was then slowly withdrawn as the colon was fully examined.      COLON FINDINGS: Severe diverticulosis was noted in the sigmoid colon.   The colon mucosa was otherwise normal.  Retroflexed views revealed no abnormalities. The time to cecum=2 minutes 43 seconds. Withdrawal time=12 minutes 04 seconds.  The scope was withdrawn and the procedure completed. COMPLICATIONS: There were no complications.  ENDOSCOPIC IMPRESSION: 1.   Severe diverticulosis was noted in the sigmoid colon 2.   The colon mucosa was otherwise normal - excellent  prep - hx 2 adenomas [id number]  RECOMMENDATIONS: 1.  Repeat colonoscopy 10 years. 2.   Only 2 small adenomas in 2009 so repeat in 10 years ok.   eSigned:  Iva Boop, MD, [facility] 12/12/2013 2:01 PM   cc: The Patient

## 2013-12-12 NOTE — Progress Notes (Signed)
A/ox3 pleased with MAC, report to Annette RN 

## 2013-12-12 NOTE — Patient Instructions (Addendum)
No polyps today! You do still have a condition called diverticulosis - common and not usually a problem. Please read the handout provided.  Next routine colonoscopy in 10 years - 2025  I appreciate the opportunity to care for you. Iva Boop, MD, FACG      YOU HAD AN ENDOSCOPIC PROCEDURE TODAY AT THE [facility]: Refer to the procedure report that was given to you for any specific questions about what was found during the examination.  If the procedure report does not answer your questions, please call your gastroenterologist to clarify.  If you requested that your care partner not be given the details of your procedure findings, then the procedure report has been included in a sealed envelope for you to review at your convenience later.  YOU SHOULD EXPECT: Some feelings of bloating in the abdomen. Passage of more gas than usual.  Walking can help get rid of the air that was put into your GI tract during the procedure and reduce the bloating. If you had a lower endoscopy (such as a colonoscopy or flexible sigmoidoscopy) you may notice spotting of blood in your stool or on the toilet paper. If you underwent a bowel prep for your procedure, then you may not have a normal bowel movement for a few days.  DIET: Your first meal following the procedure should be a light meal and then it is ok to progress to your normal diet.  A half-sandwich or bowl of soup is an example of a good first meal.  Heavy or fried foods are harder to digest and may make you feel nauseous or bloated.  Likewise meals heavy in dairy and vegetables can cause extra gas to form and this can also increase the bloating.  Drink plenty of fluids but you should avoid alcoholic beverages for 24 hours.  ACTIVITY: Your care partner should take you home directly after the procedure.  You should plan to take it easy, moving slowly for the rest of the day.  You can resume normal activity the day after the procedure however you  should NOT DRIVE or use heavy machinery for 24 hours (because of the sedation medicines used during the test).    SYMPTOMS TO REPORT IMMEDIATELY: A gastroenterologist can be reached at any hour.  During normal business hours, 8:30 AM to 5:00 PM Monday through Friday, call [phone].  After hours and on weekends, please call the GI answering service at [phone] who will take a message and have the physician on call contact you.   Following lower endoscopy (colonoscopy or flexible sigmoidoscopy):  Excessive amounts of blood in the stool  Significant tenderness or worsening of abdominal pains  Swelling of the abdomen that is new, acute  Fever of 100F or higher FOLLOW UP: If any biopsies were taken you will be contacted by phone or by letter within the next 1-3 weeks.  Call your gastroenterologist if you have not heard about the biopsies in 3 weeks.  Our staff will call the home number listed on your records the next business day following your procedure to check on you and address any questions or concerns that you may have at that time regarding the information given to you following your procedure. This is a courtesy call and so if there is no answer at the home number and we have not heard from you through the emergency physician on call, we will assume that you have returned to your regular daily activities without incident.  SIGNATURES/CONFIDENTIALITY: You and/or your care partner have signed paperwork which will be entered into your electronic medical record.  These signatures attest to the fact that that the information above on your After Visit Summary has been reviewed and is understood.  Full responsibility of the confidentiality of this discharge information lies with you and/or your care-partner.   Handout was given to your care partner on diverticulosis. You may resume your current medications today. Please call if any questions or concerns.

## 2013-12-12 NOTE — Progress Notes (Signed)
No problems noted in the recovery room. maw 

## 2013-12-13 ENCOUNTER — Telehealth: Payer: Self-pay | Admitting: *Deleted

## 2013-12-13 NOTE — Telephone Encounter (Signed)
  Follow up Call-  Call back number 12/12/2013  Post procedure Call Back phone  # [phone]  Permission to leave phone message No     Patient questions:  Do you have a fever, pain , or abdominal swelling? No. Pain Score  0 *  Have you tolerated food without any problems? Yes.    Have you been able to return to your normal activities? Yes.    Do you have any questions about your discharge instructions: Diet   No. Medications  No. Follow up visit  No.  Do you have questions or concerns about your Care? No.  Actions: * If pain score is 4 or above: No action needed, pain <4.

## 2014-01-09 ENCOUNTER — Ambulatory Visit (INDEPENDENT_AMBULATORY_CARE_PROVIDER_SITE_OTHER): Payer: Medicare Other

## 2014-01-09 DIAGNOSIS — Z23 Encounter for immunization: Secondary | ICD-10-CM

## 2014-03-23 ENCOUNTER — Other Ambulatory Visit: Payer: Self-pay | Admitting: Internal Medicine

## 2014-07-17 ENCOUNTER — Telehealth: Payer: Self-pay

## 2014-07-17 NOTE — Telephone Encounter (Signed)
PLEASE NOTE: All timestamps contained within this report are represented as [place] Standard Time. CONFIDENTIALTY NOTICE: This fax transmission is intended only for the addressee. It contains information that is legally privileged, confidential or otherwise protected from use or disclosure. If you are not the intended recipient, you are strictly prohibited from reviewing, disclosing, copying using or disseminating any of this information or taking any action in reliance on or regarding this information. If you have received this fax in error, please notify us immediately by telephone so that we can arrange for its return to [place]. Phone: [phone], Toll-Free: [phone], Fax: [phone] Page: 1 of 2 Call Id: [id number] [facility] Night - Client TELEPHONE ADVICE RECORD [facility] Patient Name: Colleen Cochran Gender: Female DOB: 05/21/1945 Age: 69 Y 6 M 20 D Return Phone Number: [phone] (Primary) Address: City/State/Zip: [place] Client [facility] Night - Client Client Site  [facility] Night Physician [place], [place] Contact Type Call Call Type Triage / Clinical Caller Name Lakynn Relationship To Patient Self Return Phone Number [phone] (Primary) Chief Complaint Diarrhea Initial Comment Caller states she had vomiting and diarrhea on Wed, she now has Diarrhea. PreDisposition Home Care Nurse Assessment Nurse: Stefano Gaul, RN, Dwana Curd Date/Time Lamount Cohen Time): 07/14/2014 10:30:40 AM Confirm and document reason for call. If symptomatic, describe symptoms. ---Caller states she had vomiting and diarrhea on Wednesday. Vomiting has stopped. She is still having diarrhea. Took immodium last night. Slept all night. had diarrhea again this am and took another immodium. She is drinking fluids. She is urinating. Has the patient traveled out of the country within the last 30 days? ---No Does the patient require  triage? ---Yes Related visit to physician within the last 2 weeks? ---No Does the PT have any chronic conditions? (i.e. diabetes, asthma, etc.) ---Yes List chronic conditions. ---HTN Guidelines Guideline Title Affirmed Question Affirmed Notes Nurse Date/Time Lamount Cohen Time) Diarrhea Mild diarrhea (all triage questions negative) Stefano Gaul, RN, Dwana Curd 07/14/2014 10:33:46 AM Disp. Time Lamount Cohen Time) Disposition Final User 07/14/2014 10:46:55 AM Home Care Yes Stefano Gaul, RN, Clerance Lav Understands: Yes Disagree/Comply: Comply PLEASE NOTE: All timestamps contained within this report are represented as [place] Standard Time. CONFIDENTIALTY NOTICE: This fax transmission is intended only for the addressee. It contains information that is legally privileged, confidential or otherwise protected from use or disclosure. If you are not the intended recipient, you are strictly prohibited from reviewing, disclosing, copying using or disseminating any of this information or taking any action in reliance on or regarding this information. If you have received this fax in error, please notify us immediately by telephone so that we can arrange for its return to [place]. Phone: [phone], Toll-Free: [phone], Fax: [phone] Page: 2 of 2 Call Id: [id number] Care Advice Given Per Guideline HOME CARE: You should be able to treat this at home. REASSURANCE: * In healthy adults, most new onset diarrhea is caused by a viral infection of the intestines. * Diarrhea is the body's way of getting rid of the germs. * Here are some tips on how to keep ahead of the fluid losses. * Drink more fluids, at least 8-10 glasses (8 oz) daily. * For example: sports drinks, diluted fruit juices, soft drinks. * Supplement this with saltine crackers or soups, to make certain that you are getting sufficient fluid and salt to meet your body's needs. * Avoid caffeinated beverages (Reason: caffeine is mildly dehydrating). * Maintaining some  food intake during episodes of diarrhea is  important. NUTRITION DURING MILD-MODERATE DIARRHEA * Ideal initial foods include boiled starches / cereals (e.g., potatoes, rice, noodles, wheat, oats) with a small amount of salt to taste. * As your stools return to normal consistency, resume a normal diet. * Other acceptable foods include: bananas, yogurt, crackers, soup. OTC MEDS - Loperamide (Imodium AD): * Helps reduce diarrhea. * Adult dosage: 4 mg (2 capsules or 4 teaspoons or 20 ml) is the recommended first dose. You may take an additional 2 mg (1 capsule or 2 teaspoons or 10 ml) after each loose BM. * Do not use for more than 2 days. EXPECTED COURSE: Viral diarrhea lasts 4-7 days. Always worse on days 1 and 2. * Diarrhea persists over 7 days CALL BACK IF: * Signs of dehydration occur (e.g., no urine over 12 hours, very dry mouth, lightheaded, etc.) * You become worse. CARE ADVICE given per Diarrhea (Adult) guideline. CONTAGIOUSNESS: * Be certain to wash your hands after using the restroom. * If your work is cooking, Aeronautical engineerhandling, serving or preparing food, then you should not work until the diarrhea has completely stopped. FLUID THERAPY DURING MILD-MODERATE DIARRHEA: After Care Instructions Given Call Event Type User Date / Time Description

## 2014-09-26 ENCOUNTER — Other Ambulatory Visit: Payer: Self-pay | Admitting: Family Medicine

## 2014-10-09 ENCOUNTER — Other Ambulatory Visit: Payer: Self-pay | Admitting: Family Medicine

## 2014-11-17 ENCOUNTER — Other Ambulatory Visit: Payer: Self-pay | Admitting: *Deleted

## 2014-11-17 MED ORDER — ALBUTEROL SULFATE HFA 108 (90 BASE) MCG/ACT IN AERS: INHALATION_SPRAY | RESPIRATORY_TRACT | Status: DC

## 2015-01-24 ENCOUNTER — Encounter: Payer: Self-pay | Admitting: Family Medicine

## 2015-01-24 ENCOUNTER — Ambulatory Visit (INDEPENDENT_AMBULATORY_CARE_PROVIDER_SITE_OTHER): Payer: Medicare Other | Admitting: Family Medicine

## 2015-01-24 VITALS — BP 130/80 | HR 99 | Temp 98.5°F | Ht 65.0 in | Wt 144.5 lb

## 2015-01-24 DIAGNOSIS — J069 Acute upper respiratory infection, unspecified: Secondary | ICD-10-CM | POA: Diagnosis not present

## 2015-01-24 DIAGNOSIS — B9789 Other viral agents as the cause of diseases classified elsewhere: Principal | ICD-10-CM

## 2015-01-24 NOTE — Progress Notes (Signed)
Subjective:    Patient ID: Colleen Cochran, female    DOB: October 04, 1945, 69 y.o.   MRN: [id number]  HPI Here with uri symptoms -started over the weekend  No sick contacts   A lot of head/sinus pressure and pain  Hoarse  Post nasal drip Congestion /nasal  Cough is deep - prod of scant mucous (worse at night)   All clear nasal discharge so far   Temp 101 yesterday  Chills/aches a few days ago -one night   [facility] sinus decongestant -phenylephrine -taking that  Using her albuterol inhaler   bp has been up a bit  BP Readings from Last 3 Encounters:  01/24/15 146/78  12/12/13 144/85  09/30/13 140/82    At home 128/80  Higher here- has whitecoat   Re check here 130/80  Patient Active Problem List   Diagnosis Date Noted  . Encounter for Medicare annual wellness exam 09/30/2013  . Family history of aortic aneurysm 09/30/2013  . Other screening mammogram 11/24/2011  . Routine general medical examination at a health care facility 11/17/2011  . Hypertension 09/24/2010  . POSTMENOPAUSAL STATUS 06/24/2010  . UNSPECIFIED VITAMIN D DEFICIENCY 03/02/2009  . History of colon polyps - adenomas 12/17/2007  . HYPERLIPIDEMIA 10/05/2007  . SINUSITIS, RECURRENT 10/05/2007  . ALLERGIC RHINITIS 10/05/2007  . OSTEOPENIA 10/05/2007  . NEPHROLITHIASIS, HX OF 10/05/2007   Past Medical History  Diagnosis Date  . Allergy     allergic rhinitis  . Hyperlipidemia   . Nephrolithiasis     Hx of  . Osteopenia   . Kidney stone   . Arthritis   . Hypertension   . History of colon polyps - adenomas 12/17/2007   Past Surgical History  Procedure Laterality Date  . Cesarean section  1969  . Dilation and curettage of uterus      x2  . Abdominal hysterectomy  1980    total for bleeding  . Colonoscopy     Social History  Substance Use Topics  . Smoking status: Passive Smoke Exposure - Never Smoker  . Smokeless tobacco: Never Used     Comment: husband smokes  . Alcohol Use: No   Family  History  Problem Relation Age of Onset  . Hypertension Mother   . Osteoporosis Mother   . Heart disease Father     CABG,CAD,MI  . Kidney failure Father   . Osteoporosis Father     ? of OP pt had hip fx.  . Hypertension Brother   . Colon cancer Neg Hx   . Pancreatic cancer Neg Hx   . Rectal cancer Neg Hx   . Stomach cancer Neg Hx    No Known Allergies Current Outpatient Prescriptions on File Prior to Visit  Medication Sig Dispense Refill  . albuterol (VENTOLIN HFA) 108 (90 BASE) MCG/ACT inhaler inhale 1 to 2 puffs BY MOUTH INTO THE LUNGS EVERY 4 HOURS IF NEEDED 18 g 4  . aspirin 81 MG tablet Take 81 mg by mouth daily.    . Calcium Citrate-Vitamin D (CALCIUM CITRATE + D3 PO) Take 1 capsule by mouth.    . cholecalciferol (VITAMIN D) 1000 UNITS tablet Take 1,000 Units by mouth daily.      Marland Kitchen ibuprofen (ADVIL,MOTRIN) 200 MG tablet Take 200 mg by mouth every 6 (six) hours as needed.    Marland Kitchen lisinopril (PRINIVIL,ZESTRIL) 10 MG tablet take 1 tablet by mouth once daily 30 tablet 5  . simvastatin (ZOCOR) 20 MG tablet take 1/2 tablet by  mouth once daily 15 tablet 5   No current facility-administered medications on file prior to visit.    Review of Systems Review of Systems  Constitutional: Negative for fever,(now)  appetite change,  and unexpected weight change. pos for fatigue/malaise ENT pos for cong and rhinorrhea and ST Eyes: Negative for pain and visual disturbance.  Respiratory: Negative for wheeze  and shortness of breath.   Cardiovascular: Negative for cp or palpitations    Gastrointestinal: Negative for nausea, diarrhea and constipation.  Genitourinary: Negative for urgency and frequency.  Skin: Negative for pallor or rash   Neurological: Negative for weakness, light-headedness, numbness and headaches.  Hematological: Negative for adenopathy. Does not bruise/bleed easily.  Psychiatric/Behavioral: Negative for dysphoric mood. The patient is not nervous/anxious.         Objective:    Physical Exam  Constitutional: She appears well-developed and well-nourished. No distress.  HENT:  Head: Normocephalic and atraumatic.  Right Ear: External ear normal.  Left Ear: External ear normal.  Mouth/Throat: Oropharynx is clear and moist.  Nares are injected and congested  No sinus tenderness Clear rhinorrhea and post nasal drip   Eyes: Conjunctivae and EOM are normal. Pupils are equal, round, and reactive to light. Right eye exhibits no discharge. Left eye exhibits no discharge.  Neck: Normal range of motion. Neck supple.  Cardiovascular: Normal rate and normal heart sounds.   Pulmonary/Chest: Effort normal and breath sounds normal. No respiratory distress. She has no wheezes. She has no rales. She exhibits no tenderness.  Good air esch  Hacking cough  Lymphadenopathy:    She has no cervical adenopathy.  Neurological: She is alert.  Skin: Skin is warm and dry. No rash noted.  Psychiatric: She has a normal mood and affect.          Assessment & Plan:   Problem List Items Addressed This Visit      Respiratory   Viral URI with cough - Primary    Reassuring exam  Disc symptomatic care - see instructions on AVS  Update if not starting to improve in a week or if worsening  -esp if fever /wheezing or sob or sinus pain

## 2015-01-24 NOTE — Progress Notes (Signed)
Pre visit review using our clinic review tool, if applicable. No additional management support is needed unless otherwise documented below in the visit note. 

## 2015-01-24 NOTE — Patient Instructions (Signed)
I think you have a head and chest cold  The [facility] decongestant is ok  For runny nose - you can try zyrtec or allegra or claritin if you feel you need it  For congestion and cough - try mucinex DM as directed over the counter Tylenol for fever  Albuterol inhaler for wheezing or tight breathing   Rest/fluids/ nasal saline spray if it helps    Update if not starting to improve in a week or if worsening  - watch for increasing temp/ sinus pain with green nasal discharge

## 2015-01-25 ENCOUNTER — Encounter: Payer: Self-pay | Admitting: Internal Medicine

## 2015-01-25 NOTE — Assessment & Plan Note (Signed)
Reassuring exam  Disc symptomatic care - see instructions on AVS  Update if not starting to improve in a week or if worsening  -esp if fever /wheezing or sob or sinus pain

## 2015-03-29 ENCOUNTER — Telehealth: Payer: Self-pay | Admitting: Family Medicine

## 2015-03-29 DIAGNOSIS — E785 Hyperlipidemia, unspecified: Secondary | ICD-10-CM

## 2015-03-29 DIAGNOSIS — E559 Vitamin D deficiency, unspecified: Secondary | ICD-10-CM

## 2015-03-29 DIAGNOSIS — I10 Essential (primary) hypertension: Secondary | ICD-10-CM

## 2015-03-29 NOTE — Telephone Encounter (Signed)
-----   Message from Alvina Chouerri J Walsh sent at 03/27/2015 11:10 AM EST ----- Regarding: Lab orders for Friday, 12.9.16 Patient is scheduled for CPX labs, please order future labs, Thanks , Camelia Engerri

## 2015-03-30 ENCOUNTER — Other Ambulatory Visit (INDEPENDENT_AMBULATORY_CARE_PROVIDER_SITE_OTHER): Payer: Medicare Other

## 2015-03-30 DIAGNOSIS — I10 Essential (primary) hypertension: Secondary | ICD-10-CM | POA: Diagnosis not present

## 2015-03-30 DIAGNOSIS — E559 Vitamin D deficiency, unspecified: Secondary | ICD-10-CM

## 2015-03-30 DIAGNOSIS — E785 Hyperlipidemia, unspecified: Secondary | ICD-10-CM | POA: Diagnosis not present

## 2015-03-30 LAB — COMPREHENSIVE METABOLIC PANEL
ALT: 19 U/L (ref 0–35)
AST: 19 U/L (ref 0–37)
Albumin: 4.2 g/dL (ref 3.5–5.2)
Alkaline Phosphatase: 57 U/L (ref 39–117)
BUN: 13 mg/dL (ref 6–23)
CALCIUM: 9.5 mg/dL (ref 8.4–10.5)
CHLORIDE: 106 meq/L (ref 96–112)
CO2: 28 meq/L (ref 19–32)
CREATININE: 0.61 mg/dL (ref 0.40–1.20)
GFR: 103.59 mL/min (ref >60.00)
Glucose, Bld: 79 mg/dL (ref 70–99)
Potassium: 3.9 mEq/L (ref 3.5–5.1)
Sodium: 143 mEq/L (ref 135–145)
Total Bilirubin: 0.6 mg/dL (ref 0.2–1.2)
Total Protein: 7.1 g/dL (ref 6.0–8.3)

## 2015-03-30 LAB — LIPID PANEL
CHOL/HDL RATIO: 3
Cholesterol: 154 mg/dL (ref 0–200)
HDL: 44.3 mg/dL (ref >39.00)
LDL CALC: 88 mg/dL (ref 0–99)
NonHDL: 109.71
TRIGLYCERIDES: 108 mg/dL (ref 0.0–149.0)
VLDL: 21.6 mg/dL (ref 0.0–40.0)

## 2015-03-30 LAB — TSH: TSH: 1.11 u[IU]/mL (ref 0.35–4.50)

## 2015-03-30 LAB — CBC WITH DIFFERENTIAL/PLATELET
BASOS PCT: 0.6 % (ref 0.0–3.0)
Basophils Absolute: 0 10*3/uL (ref 0.0–0.1)
EOS ABS: 0.1 10*3/uL (ref 0.0–0.7)
Eosinophils Relative: 2.6 % (ref 0.0–5.0)
HEMATOCRIT: 39.6 % (ref 36.0–46.0)
Hemoglobin: 13.2 g/dL (ref 12.0–15.0)
Lymphocytes Relative: 32.1 % (ref 12.0–46.0)
Lymphs Abs: 1.5 10*3/uL (ref 0.7–4.0)
MCHC: 33.3 g/dL (ref 30.0–36.0)
MCV: 89.3 fl (ref 78.0–100.0)
MONO ABS: 0.5 10*3/uL (ref 0.1–1.0)
Monocytes Relative: 10.5 % (ref 3.0–12.0)
NEUTROS ABS: 2.5 10*3/uL (ref 1.4–7.7)
Neutrophils Relative %: 54.2 % (ref 43.0–77.0)
PLATELETS: 320 10*3/uL (ref 150.0–400.0)
RBC: 4.43 Mil/uL (ref 3.87–5.11)
RDW: 13.4 % (ref 11.5–15.5)
WBC: 4.6 10*3/uL (ref 4.0–10.5)

## 2015-03-30 LAB — VITAMIN D 25 HYDROXY (VIT D DEFICIENCY, FRACTURES): VITD: 28.02 ng/mL — AB (ref 30.00–100.00)

## 2015-04-04 ENCOUNTER — Other Ambulatory Visit: Payer: Self-pay | Admitting: Family Medicine

## 2015-04-04 ENCOUNTER — Encounter: Payer: Self-pay | Admitting: Family Medicine

## 2015-04-04 ENCOUNTER — Ambulatory Visit (INDEPENDENT_AMBULATORY_CARE_PROVIDER_SITE_OTHER): Payer: Medicare Other | Admitting: Family Medicine

## 2015-04-04 VITALS — BP 140/85 | HR 78 | Temp 98.8°F | Ht 65.25 in | Wt 144.8 lb

## 2015-04-04 DIAGNOSIS — E559 Vitamin D deficiency, unspecified: Secondary | ICD-10-CM

## 2015-04-04 DIAGNOSIS — M899 Disorder of bone, unspecified: Secondary | ICD-10-CM

## 2015-04-04 DIAGNOSIS — E785 Hyperlipidemia, unspecified: Secondary | ICD-10-CM

## 2015-04-04 DIAGNOSIS — Z0000 Encounter for general adult medical examination without abnormal findings: Secondary | ICD-10-CM

## 2015-04-04 DIAGNOSIS — I10 Essential (primary) hypertension: Secondary | ICD-10-CM

## 2015-04-04 DIAGNOSIS — M949 Disorder of cartilage, unspecified: Secondary | ICD-10-CM

## 2015-04-04 DIAGNOSIS — E2839 Other primary ovarian failure: Secondary | ICD-10-CM | POA: Insufficient documentation

## 2015-04-04 DIAGNOSIS — Z23 Encounter for immunization: Secondary | ICD-10-CM | POA: Diagnosis not present

## 2015-04-04 DIAGNOSIS — Z1231 Encounter for screening mammogram for malignant neoplasm of breast: Secondary | ICD-10-CM

## 2015-04-04 MED ORDER — LISINOPRIL 10 MG PO TABS: 10.0000 mg | ORAL_TABLET | Freq: Every day | ORAL | Status: DC

## 2015-04-04 MED ORDER — SIMVASTATIN 20 MG PO TABS: 10.0000 mg | ORAL_TABLET | Freq: Every day | ORAL | Status: DC

## 2015-04-04 NOTE — Patient Instructions (Signed)
Keep an eye on your blood pressure Don't forget to schedule a mammogram  Flu shot today prevnar shot today  If you find out that your blood transfusion was before 1980- we should screen you for Hep C Please work on an advance directive (the blue booklet I gave you) Try to get 1200-1500 mg of calcium per day with at least 1000 iu of vitamin D - for bone health  Add another 1000 iu of vitamin D over the counter  I want your total vitamin D to be about 3000 iu daily   Stop at check out for ref for bone density test

## 2015-04-04 NOTE — Progress Notes (Signed)
Subjective:    Patient ID: Colleen Cochran, female    DOB: 1946-01-12, 69 y.o.   MRN: [id number]  HPI  Here for annual medicare wellness visit as well as chronic/acute medical problems as well as annual preventative examination  I have personally reviewed the Medicare Annual Wellness questionnaire and have noted 1. The patient's medical and social history 2. Their use of alcohol, tobacco or illicit drugs 3. Their current medications and supplements 4. The patient's functional ability including ADL's, fall risks, home safety risks and hearing or visual             impairment. 5. Diet and physical activities 6. Evidence for depression or mood disorders  The patients weight, height, BMI have been recorded in the chart and visual acuity is per eye clinic.  I have made referrals, counseling and provided education to the patient based review of the above and I have provided the pt with a written personalized care plan for preventive services. Reviewed and updated provider list, see scanned forms.  Has been feeling ok  Very busy   Bruised her ribs recently - slowly getting better / no sob -using heating pad and ibuprofen    See scanned forms.  Routine anticipatory guidance given to patient.  See health maintenance. Colon cancer screening 8/15- 10 year recall  Breast cancer screening 7/15 was normal - had not had one this year yet  Self breast -no lumps or changes  Flu vaccine - has not had it yet  Tetanus vaccine 6/09 Pneumovax- due for prevnar today  Zoster vaccine 8/12  dexa - 4/12 - osteopenia - due for re check/ wants to schedule that , no new falls or fractures (fx leg was several years ago)  Hep C screening - unsure if she wants screening - needs to find out when her last blood transfusion was  Advance directive - does not have Living will or POA-knows what it is - wants to work on that  Cognitive function addressed- see scanned forms- and if abnormal then additional  documentation follows. -no concerns/doing well   PMH and SH reviewed  Meds, vitals, and allergies reviewed.   ROS: See HPI.  Otherwise negative.     bp is up a bit today-has been taking ibuprofen At home it has been great - avg 111/70-80 No cp or palpitations or headaches or edema  No side effects to medicines  BP Readings from Last 3 Encounters:  04/04/15 142/94  01/24/15 130/80  12/12/13 144/85    Re check BP: 140/85 mmHg    Hyperlipidemia Lab Results  Component Value Date   CHOL 154 03/30/2015   CHOL 171 09/23/2013   CHOL 169 11/18/2011   Lab Results  Component Value Date   HDL 44.30 03/30/2015   HDL 47.00 09/23/2013   HDL 47.30 11/18/2011   Lab Results  Component Value Date   LDLCALC 88 03/30/2015   LDLCALC 102* 09/23/2013   LDLCALC 105* 11/18/2011   Lab Results  Component Value Date   TRIG 108.0 03/30/2015   TRIG 110.0 09/23/2013   TRIG 86.0 11/18/2011   Lab Results  Component Value Date   CHOLHDL 3 03/30/2015   CHOLHDL 4 09/23/2013   CHOLHDL 4 11/18/2011   Lab Results  Component Value Date   LDLDIRECT 179.6 03/02/2009    Improved cholesterol!  Is watching diet   Vitamin D level low at 28 Not taking ca or D   Results for orders placed or performed in visit  on 03/30/15  CBC with Differential/Platelet  Result Value Ref Range   WBC 4.6 4.0 - 10.5 K/uL   RBC 4.43 3.87 - 5.11 Mil/uL   Hemoglobin 13.2 12.0 - 15.0 g/dL   HCT 16.139.6 09.636.0 - 04.546.0 %   MCV 89.3 78.0 - 100.0 fl   MCHC 33.3 30.0 - 36.0 g/dL   RDW 40.913.4 81.111.5 - 91.415.5 %   Platelets 320.0 150.0 - 400.0 K/uL   Neutrophils Relative % 54.2 43.0 - 77.0 %   Lymphocytes Relative 32.1 12.0 - 46.0 %   Monocytes Relative 10.5 3.0 - 12.0 %   Eosinophils Relative 2.6 0.0 - 5.0 %   Basophils Relative 0.6 0.0 - 3.0 %   Neutro Abs 2.5 1.4 - 7.7 K/uL   Lymphs Abs 1.5 0.7 - 4.0 K/uL   Monocytes Absolute 0.5 0.1 - 1.0 K/uL   Eosinophils Absolute 0.1 0.0 - 0.7 K/uL   Basophils Absolute 0.0 0.0 - 0.1 K/uL   Comprehensive metabolic panel  Result Value Ref Range   Sodium 143 135 - 145 mEq/L   Potassium 3.9 3.5 - 5.1 mEq/L   Chloride 106 96 - 112 mEq/L   CO2 28 19 - 32 mEq/L   Glucose, Bld 79 70 - 99 mg/dL   BUN 13 6 - 23 mg/dL   Creatinine, Ser 7.820.61 0.40 - 1.20 mg/dL   Total Bilirubin 0.6 0.2 - 1.2 mg/dL   Alkaline Phosphatase 57 39 - 117 U/L   AST 19 0 - 37 U/L   ALT 19 0 - 35 U/L   Total Protein 7.1 6.0 - 8.3 g/dL   Albumin 4.2 3.5 - 5.2 g/dL   Calcium 9.5 8.4 - 95.610.5 mg/dL   GFR 213.08103.59 >65.78>60.00 mL/min  Lipid panel  Result Value Ref Range   Cholesterol 154 0 - 200 mg/dL   Triglycerides 469.6108.0 0.0 - 149.0 mg/dL   HDL 29.5244.30 >84.13>39.00 mg/dL   VLDL 24.421.6 0.0 - 01.040.0 mg/dL   LDL Cholesterol 88 0 - 99 mg/dL   Total CHOL/HDL Ratio 3    NonHDL 109.71   TSH  Result Value Ref Range   TSH 1.11 0.35 - 4.50 uIU/mL  VITAMIN D 25 Hydroxy (Vit-D Deficiency, Fractures)  Result Value Ref Range   VITD 28.02 (L) 30.00 - 100.00 ng/mL    Overall ok    Patient Active Problem List   Diagnosis Date Noted  . Estrogen deficiency 04/04/2015  . Viral URI with cough 01/24/2015  . Encounter for Medicare annual wellness exam 09/30/2013  . Family history of aortic aneurysm 09/30/2013  . Other screening mammogram 11/24/2011  . Routine general medical examination at a health care facility 11/17/2011  . Hypertension 09/24/2010  . POSTMENOPAUSAL STATUS 06/24/2010  . Vitamin D deficiency 03/02/2009  . History of colon polyps - adenomas 12/17/2007  . Hyperlipidemia 10/05/2007  . SINUSITIS, RECURRENT 10/05/2007  . ALLERGIC RHINITIS 10/05/2007  . Disorder of bone and cartilage 10/05/2007  . NEPHROLITHIASIS, HX OF 10/05/2007   Past Medical History  Diagnosis Date  . Allergy     allergic rhinitis  . Hyperlipidemia   . Nephrolithiasis     Hx of  . Osteopenia   . Kidney stone   . Arthritis   . Hypertension   . History of colon polyps - adenomas 12/17/2007   Past Surgical History  Procedure Laterality  Date  . Cesarean section  1969  . Dilation and curettage of uterus      x2  . Abdominal hysterectomy  1980    total for bleeding  . Colonoscopy     Social History  Substance Use Topics  . Smoking status: Passive Smoke Exposure - Never Smoker  . Smokeless tobacco: Never Used     Comment: husband smokes  . Alcohol Use: No   Family History  Problem Relation Age of Onset  . Hypertension Mother   . Osteoporosis Mother   . Heart disease Father     CABG,CAD,MI  . Kidney failure Father   . Osteoporosis Father     ? of OP pt had hip fx.  . Hypertension Brother   . Colon cancer Neg Hx   . Pancreatic cancer Neg Hx   . Rectal cancer Neg Hx   . Stomach cancer Neg Hx    No Known Allergies Current Outpatient Prescriptions on File Prior to Visit  Medication Sig Dispense Refill  . albuterol (VENTOLIN HFA) 108 (90 BASE) MCG/ACT inhaler inhale 1 to 2 puffs BY MOUTH INTO THE LUNGS EVERY 4 HOURS IF NEEDED 18 g 4  . aspirin 81 MG tablet Take 81 mg by mouth daily.    Marland Kitchen ibuprofen (ADVIL,MOTRIN) 200 MG tablet Take 200 mg by mouth every 6 (six) hours as needed.    . Calcium Citrate-Vitamin D (CALCIUM CITRATE + D3 PO) Take 1 capsule by mouth.    . cholecalciferol (VITAMIN D) 1000 UNITS tablet Take 1,000 Units by mouth daily.       No current facility-administered medications on file prior to visit.    Review of Systems Review of Systems  Constitutional: Negative for fever, appetite change, fatigue and unexpected weight change.  Eyes: Negative for pain and visual disturbance.  Respiratory: Negative for cough and shortness of breath.  pos for sore ribs on L after injury Cardiovascular: Negative for cp or palpitations    Gastrointestinal: Negative for nausea, diarrhea and constipation.  Genitourinary: Negative for urgency and frequency.  Skin: Negative for pallor or rash   Neurological: Negative for weakness, light-headedness, numbness and headaches.  Hematological: Negative for adenopathy. Does  not bruise/bleed easily.  Psychiatric/Behavioral: Negative for dysphoric mood. The patient is not nervous/anxious.         Objective:   Physical Exam  Constitutional: She appears well-developed and well-nourished. No distress.  Well appearing   HENT:  Head: Normocephalic and atraumatic.  Right Ear: External ear normal.  Left Ear: External ear normal.  Mouth/Throat: Oropharynx is clear and moist.  Eyes: Conjunctivae and EOM are normal. Pupils are equal, round, and reactive to light. No scleral icterus.  Neck: Normal range of motion. Neck supple. No JVD present. Carotid bruit is not present. No thyromegaly present.  Cardiovascular: Normal rate, regular rhythm, normal heart sounds and intact distal pulses.  Exam reveals no gallop.   Pulmonary/Chest: Effort normal and breath sounds normal. No respiratory distress. She has no wheezes. She has no rales. She exhibits tenderness.  Sore over L lateral ribs without crepitus or skin change  Abdominal: Soft. Bowel sounds are normal. She exhibits no distension, no abdominal bruit and no mass. There is no tenderness.  Genitourinary: No breast swelling, tenderness, discharge or bleeding.  Breast exam: No mass, nodules, thickening, tenderness, bulging, retraction, inflamation, nipple discharge or skin changes noted.  No axillary or clavicular LA.      Musculoskeletal: Normal range of motion. She exhibits no edema or tenderness.  No kyphosis   Lymphadenopathy:    She has no cervical adenopathy.  Neurological: She is alert. She has normal reflexes. No  cranial nerve deficit. She exhibits normal muscle tone. Coordination normal.  Skin: Skin is warm and dry. No rash noted. No erythema. No pallor.  Psychiatric: She has a normal mood and affect.          Assessment & Plan:   Problem List Items Addressed This Visit      Cardiovascular and Mediastinum   Hypertension    bp in fair control at this time (improved at home - also on nsaid today) BP  Readings from Last 1 Encounters:  04/04/15 140/85   No changes needed Disc lifstyle change with low sodium diet and exercise  Labs reviewed  Will continue to monitor at home and here       Relevant Medications   lisinopril (PRINIVIL,ZESTRIL) 10 MG tablet   simvastatin (ZOCOR) 20 MG tablet     Musculoskeletal and Integument   Disorder of bone and cartilage    Will schedule dexa for f/u No new falls or fx  Disc need for calcium/ vitamin D/ wt bearing exercise and bone density test every 2 y to monitor Disc safety/ fracture risk in detail          Other   Encounter for Medicare annual wellness exam - Primary    Reviewed health habits including diet and exercise and skin cancer prevention Reviewed appropriate screening tests for age  Also reviewed health mt list, fam hx and immunization status , as well as social and family history   See HPI Labs reviewed  Keep an eye on your blood pressure Don't forget to schedule a mammogram  Flu shot today prevnar shot today  If you find out that your blood transfusion was before 1980- we should screen you for Hep C Please work on an advance directive (the blue booklet I gave you) Try to get 1200-1500 mg of calcium per day with at least 1000 iu of vitamin D - for bone health  Add another 1000 iu of vitamin D over the counter  I want your total vitamin D to be about 3000 iu daily   Stop at check out for ref for bone density test       Estrogen deficiency   Relevant Orders   DG Bone Density   Hyperlipidemia    Disc goals for lipids and reasons to control them Rev labs with pt Rev low sat fat diet in detail Overall improved-enc to keep working on it       Relevant Medications   lisinopril (PRINIVIL,ZESTRIL) 10 MG tablet   simvastatin (ZOCOR) 20 MG tablet   Routine general medical examination at a health care facility    Reviewed health habits including diet and exercise and skin cancer prevention Reviewed appropriate screening  tests for age  Also reviewed health mt list, fam hx and immunization status , as well as social and family history   See HPI Labs reviewed  Keep an eye on your blood pressure Don't forget to schedule a mammogram  Flu shot today prevnar shot today  If you find out that your blood transfusion was before 1980- we should screen you for Hep C Please work on an advance directive (the blue booklet I gave you) Try to get 1200-1500 mg of calcium per day with at least 1000 iu of vitamin D - for bone health  Add another 1000 iu of vitamin D over the counter  I want your total vitamin D to be about 3000 iu daily   Stop at check out for ref  for bone density test       Vitamin D deficiency    D level is low  Pt will start back on ca plus D Also add 1000 iu additional D over the counter daily  Disc imp for bone and overall health       Other Visit Diagnoses    Need for influenza vaccination        Relevant Orders    Flu Vaccine QUAD 36+ mos PF IM (Fluarix & Fluzone Quad PF) (Completed)    Need for vaccination with 13-polyvalent pneumococcal conjugate vaccine        Relevant Orders    Pneumococcal conjugate vaccine 13-valent (Completed)

## 2015-04-04 NOTE — Progress Notes (Signed)
Pre visit review using our clinic review tool, if applicable. No additional management support is needed unless otherwise documented below in the visit note. 

## 2015-04-05 NOTE — Assessment & Plan Note (Signed)
Will schedule dexa for f/u No new falls or fx  Disc need for calcium/ vitamin D/ wt bearing exercise and bone density test every 2 y to monitor Disc safety/ fracture risk in detail

## 2015-04-05 NOTE — Assessment & Plan Note (Signed)
Reviewed health habits including diet and exercise and skin cancer prevention Reviewed appropriate screening tests for age  Also reviewed health mt list, fam hx and immunization status , as well as social and family history   See HPI Labs reviewed  Keep an eye on your blood pressure Don't forget to schedule a mammogram  Flu shot today prevnar shot today  If you find out that your blood transfusion was before 1980- we should screen you for Hep C Please work on an advance directive (the blue booklet I gave you) Try to get 1200-1500 mg of calcium per day with at least 1000 iu of vitamin D - for bone health  Add another 1000 iu of vitamin D over the counter  I want your total vitamin D to be about 3000 iu daily   Stop at check out for ref for bone density test

## 2015-04-05 NOTE — Assessment & Plan Note (Signed)
Disc goals for lipids and reasons to control them Rev labs with pt Rev low sat fat diet in detail Overall improved-enc to keep working on it

## 2015-04-05 NOTE — Assessment & Plan Note (Signed)
bp in fair control at this time (improved at home - also on nsaid today) BP Readings from Last 1 Encounters:  04/04/15 140/85   No changes needed Disc lifstyle change with low sodium diet and exercise  Labs reviewed  Will continue to monitor at home and here

## 2015-04-05 NOTE — Assessment & Plan Note (Signed)
D level is low  Pt will start back on ca plus D Also add 1000 iu additional D over the counter daily  Disc imp for bone and overall health

## 2015-04-05 NOTE — Assessment & Plan Note (Signed)
Reviewed health habits including diet and exercise and skin cancer prevention Reviewed appropriate screening tests for age  Also reviewed health mt list, fam hx and immunization status , as well as social and family history   See HPI Labs reviewed  Keep an eye on your blood pressure Don't forget to schedule a mammogram  Flu shot today prevnar shot today  If you find out that your blood transfusion was before 1980- we should screen you for Hep C Please work on an advance directive (the blue booklet I gave you) Try to get 1200-1500 mg of calcium per day with at least 1000 iu of vitamin D - for bone health  Add another 1000 iu of vitamin D over the counter  I want your total vitamin D to be about 3000 iu daily   Stop at check out for ref for bone density test  

## 2015-05-09 ENCOUNTER — Ambulatory Visit
Admission: RE | Admit: 2015-05-09 | Discharge: 2015-05-09 | Disposition: A | Discharge status: Home / Self Care | Payer: Medicare Other | Source: Ambulatory Visit | Attending: Family Medicine | Admitting: Family Medicine

## 2015-05-09 ENCOUNTER — Other Ambulatory Visit: Payer: Self-pay | Admitting: Family Medicine

## 2015-05-09 DIAGNOSIS — E2839 Other primary ovarian failure: Secondary | ICD-10-CM

## 2015-05-09 DIAGNOSIS — Z13820 Encounter for screening for osteoporosis: Secondary | ICD-10-CM | POA: Insufficient documentation

## 2015-05-09 DIAGNOSIS — M8580 Other specified disorders of bone density and structure, unspecified site: Secondary | ICD-10-CM | POA: Diagnosis not present

## 2015-05-09 DIAGNOSIS — Z1231 Encounter for screening mammogram for malignant neoplasm of breast: Secondary | ICD-10-CM | POA: Diagnosis not present

## 2016-01-05 ENCOUNTER — Encounter: Payer: Self-pay | Admitting: Family Medicine

## 2016-01-05 ENCOUNTER — Other Ambulatory Visit (HOSPITAL_COMMUNITY)
Admission: RE | Admit: 2016-01-05 | Discharge: 2016-01-05 | Disposition: A | Discharge status: Home / Self Care | Payer: Medicare Other | Source: Other Acute Inpatient Hospital | Attending: Family Medicine | Admitting: Family Medicine

## 2016-01-05 ENCOUNTER — Ambulatory Visit (INDEPENDENT_AMBULATORY_CARE_PROVIDER_SITE_OTHER): Payer: Medicare Other | Admitting: Family Medicine

## 2016-01-05 VITALS — BP 160/98 | HR 103 | Temp 98.2°F | Resp 20 | Ht 65.25 in | Wt 142.8 lb

## 2016-01-05 DIAGNOSIS — I10 Essential (primary) hypertension: Secondary | ICD-10-CM | POA: Diagnosis not present

## 2016-01-05 LAB — HEPATIC FUNCTION PANEL
ALBUMIN: 4.6 g/dL (ref 3.5–5.0)
ALT: 21 U/L (ref 14–54)
AST: 24 U/L (ref 15–41)
Alkaline Phosphatase: 62 U/L (ref 38–126)
Bilirubin, Direct: <0.1 mg/dL — ABNORMAL LOW (ref 0.1–0.5)
TOTAL PROTEIN: 8.3 g/dL — AB (ref 6.5–8.1)
Total Bilirubin: 0.7 mg/dL (ref 0.3–1.2)

## 2016-01-05 LAB — BASIC METABOLIC PANEL
ANION GAP: 9 (ref 5–15)
BUN: 11 mg/dL (ref 6–20)
CALCIUM: 9.8 mg/dL (ref 8.9–10.3)
CO2: 28 mmol/L (ref 22–32)
CREATININE: 0.63 mg/dL (ref 0.44–1.00)
Chloride: 105 mmol/L (ref 101–111)
Glucose, Bld: 94 mg/dL (ref 65–99)
Potassium: 3.7 mmol/L (ref 3.5–5.1)
Sodium: 142 mmol/L (ref 135–145)

## 2016-01-05 MED ORDER — LISINOPRIL 10 MG PO TABS: 20.0000 mg | ORAL_TABLET | Freq: Every day | ORAL | 1 refills | Status: DC

## 2016-01-05 NOTE — Assessment & Plan Note (Signed)
Patient with uncontrolled hypertension at this time. Asymptomatic. Somewhat improved on recheck today. We will increase her lisinopril to 20 mg daily. She will take 2 tablets of her current prescription until gone and then pick up the new prescription. We will check a CMP today. She will follow-up with myself or her PCP in one week. She is given return precautions.

## 2016-01-05 NOTE — Progress Notes (Signed)
  Tommi Rumps, MD Phone: [phone]  Colleen Cochran is a 70 y.o. female who presents today for same-day visit.  Hypertension: Patient notes recently over the last several weeks her blood pressure has been up and down. Ranges from 99 systolic to 656C. Last night she noted she woke up and could tell her blood pressure was elevated though has difficulty describing what this means. Checked her blood pressure and it was 171/101. Rechecked it a little later was 127 systolic. Notes she had some mild lightheadedness with this though no pain, chest pain, shortness of breath, headaches, vision changes, numbness, or weakness. No swelling in her ankles either. She is on lisinopril 10 mg daily.  ROS see history of present illness  Objective  Physical Exam Vitals:   01/05/16 1224 01/05/16 1421  BP: (!) 170/100 (!) 160/98  Pulse: (!) 103   Resp: 20   Temp: 98.2 F (36.8 C)     BP Readings from Last 3 Encounters:  01/05/16 (!) 160/98  04/04/15 140/85  01/24/15 130/80   Wt Readings from Last 3 Encounters:  01/05/16 142 lb 12 oz (64.8 kg)  04/04/15 144 lb 12 oz (65.7 kg)  01/24/15 144 lb 8 oz (65.5 kg)    Physical Exam  Constitutional: No distress.  HENT:  Head: Normocephalic and atraumatic.  Mouth/Throat: Oropharynx is clear and moist.  Eyes: Conjunctivae are normal. Pupils are equal, round, and reactive to light.  Cardiovascular: Normal rate, regular rhythm and normal heart sounds.   Pulmonary/Chest: Effort normal and breath sounds normal.  Musculoskeletal: She exhibits no edema.  Neurological: She is alert.  CN 2-12 intact, 5/5 strength in bilateral biceps, triceps, grip, quads, hamstrings, plantar and dorsiflexion, sensation to light touch intact in bilateral UE and LE, normal gait, 2+ patellar reflexes  Skin: Skin is warm and dry. She is not diaphoretic.     Assessment/Plan: Please see individual problem list.  Hypertension Patient with uncontrolled hypertension at this  time. Asymptomatic. Somewhat improved on recheck today. We will increase her lisinopril to 20 mg daily. She will take 2 tablets of her current prescription until gone and then pick up the new prescription. We will check a CMP today. She will follow-up with myself or her PCP in one week. She is given return precautions.   Orders Placed This Encounter  Procedures  . Comp Met (CMET)    Meds ordered this encounter  Medications  . lisinopril (PRINIVIL,ZESTRIL) 10 MG tablet    Sig: Take 2 tablets (20 mg total) by mouth daily.    Dispense:  60 tablet    Refill:  [place], MD [place]

## 2016-01-05 NOTE — Patient Instructions (Signed)
Nice to meet you. We are going to obtain some lab work on you today. We are going to increase your lisinopril to 20 mg. We will have you follow-up in one week with either myself or your PCP. If you develop chest pain, shortness of breath, numbness, weakness, headaches, vision changes, or any new or changing symptoms please seek medical attention.

## 2016-01-07 ENCOUNTER — Telehealth: Payer: Self-pay

## 2016-01-07 NOTE — Telephone Encounter (Signed)
Per chart review tab pt was seen at Sat clinic.

## 2016-01-07 NOTE — Telephone Encounter (Signed)
PLEASE NOTE: All timestamps contained within this report are represented as Eastern Standard Time. CONFIDENTIALTY NOTICE: This fax transmission is intended only for the addressee. It contains information that is legally privileged, confidential or otherwise protected from use or disclosure. If you are not the intended recipient, you are strictly prohibited from reviewing, disclosing, copying using or dissemi[place]ating any of this information or taking any action in reliance on or regarding this information. If you have received this fax in error, please notify us immediately by telephone so that we can arrange for its return to us. Phone: [phone], Toll-Free: [phone], Fax: [phone] Page: 1 of 2 Call Id: [id number] [facility] Client TELEPHONE ADVICE RECORD [facility] Patient Name: Colleen LoseLINDA Quinton Gender: Female DOB: 05/24/1945 Age: [age] Y 11 D Return Phone Number: [phone] (Primary), [phone] (Secondary), [phone] (Alternate) Address: City/State/Zip: [place] Client [facility] Physician [place], Idamae SchullerMarne - MD Contact Type Call Who Is Calling Patient / Member / Family / Caregiver Call Type Triage / Clinical Relationship To Patient Self Return Phone Number [phone] (Secondary) Chief Complaint Blood Pressure High Reason for Call Symptomatic / Request for Health Information Initial Comment Caller states that her blood pressure is 137/93 currently. last night, it was as high as 185/107. pt was lightheaded PreDisposition Go to ED Translation No Nurse Assessment Nurse: Anner CreteWells, RN, Massie BougieBelinda Date/Time (Eastern Time): 01/05/2016 8:29:19 AM Confirm and document reason for call. If symptomatic, describe symptoms. You must click the next button to save text entered. ---Caller states that her blood pressure is 137/93. She had higher blood pressure  last night and she was lightheaded. When she first woke up this morning it was high at 152/90. She takes her blood pressure medicine at night. She is still having a little lightheadedness. Has the patient traveled out of the country within the last 30 days? ---Not Applicable Does the patient have any new or worsening symptoms? ---Yes Will a triage be completed? ---Yes Related visit to physician within the last 2 weeks? ---No Does the PT have any chronic conditions? (i.e. diabetes, asthma, etc.) ---Yes List chronic conditions. ---HTN Is this a behavioral health or substance abuse call? ---No Guidelines Guideline Title Affirmed Question Affirmed Notes Nurse Date/Time (Eastern Time) High Blood Pressure BP # 160/100 Wells, RN, Belinda 01/05/2016 8:32:29 AM Disp. Time Lamount Cohen(Eastern Time) Disposition Final User 01/05/2016 8:42:36 AM See PCP When Office is Open (within 3 days) Yes Wells, RN, Massie BougieBelinda PLEASE NOTE: All timestamps contained within this report are represented as [place] Standard Time. CONFIDENTIALTY NOTICE: This fax transmission is intended only for the addressee. It contains information that is legally privileged, confidential or otherwise protected from use or disclosure. If you are not the intended recipient, you are strictly prohibited from reviewing, disclosing, copying using or disseminating any of this information or taking any action in reliance on or regarding this information. If you have received this fax in error, please notify us immediately by telephone so that we can arrange for its return to us. Phone: [phone], Toll-Free: [phone], Fax: [phone] Page: 2 of 2 Call Id: [id number] Caller Understands: Yes Disagree/Comply: Comply Care Advice Given Per Guideline SEE PCP WITHIN 3 DAYS: * You need to be seen within 2 or 3 days. Call your doctor during regular office hours and make an appointment. An urgent care center is often the best source of care if your  doctor's office is closed  or you can't get an appointment. NOTE: If office will be open tomorrow, tell caller to call then, not in 3 days. CALL BACK IF: * Weakness or numbness of the face, arm or leg on one side of the body occurs * Difficulty walking, difficulty talking, or severe headache occurs * Chest pain or difficulty breathing occurs * Your blood pressure is over 180/110 * You become worse. CARE ADVICE given per High Blood Pressure (Adult) guideline. Referrals [facility] - ED [facility]

## 2016-01-09 ENCOUNTER — Ambulatory Visit (INDEPENDENT_AMBULATORY_CARE_PROVIDER_SITE_OTHER): Payer: Medicare Other | Admitting: Family Medicine

## 2016-01-09 ENCOUNTER — Encounter: Payer: Self-pay | Admitting: Family Medicine

## 2016-01-09 VITALS — BP 143/80 | HR 84 | Temp 98.4°F | Ht 65.25 in | Wt 141.5 lb

## 2016-01-09 DIAGNOSIS — I10 Essential (primary) hypertension: Secondary | ICD-10-CM

## 2016-01-09 DIAGNOSIS — Z7722 Contact with and (suspected) exposure to environmental tobacco smoke (acute) (chronic): Secondary | ICD-10-CM | POA: Diagnosis not present

## 2016-01-09 DIAGNOSIS — Z23 Encounter for immunization: Secondary | ICD-10-CM

## 2016-01-09 DIAGNOSIS — J069 Acute upper respiratory infection, unspecified: Secondary | ICD-10-CM | POA: Diagnosis not present

## 2016-01-09 MED ORDER — LISINOPRIL-HYDROCHLOROTHIAZIDE 20-12.5 MG PO TABS: 1.0000 | ORAL_TABLET | Freq: Every day | ORAL | 11 refills | Status: DC

## 2016-01-09 NOTE — Assessment & Plan Note (Signed)
Urged pt to enc husband to take smoke outside or quit completely in light of her bp and sinus issues

## 2016-01-09 NOTE — Assessment & Plan Note (Signed)
bp is still not quite at goal  BP Readings from Last 1 Encounters:  01/09/16 (!) 143/80   Will add hctz 12.5 mg to her 20 mg of lisinopril Disc poss side eff in detail  Disc lifstyle change with low sodium diet and exercise  Disc proper way to check bp at home (her cuff runs a little lower as well)  She plans to exercise more  Will update if problems F/u approx 2 wk for visit and lab

## 2016-01-09 NOTE — Assessment & Plan Note (Signed)
In day 2 with primarily pnd and runny nose  Some mild sinus pressure  Disc trial of claritin for symptoms  Nasal saline  Watch for fever/sinus pain  Try to avoid 2nd hand smoke if possible  Update if not starting to improve in a week or if worsening

## 2016-01-09 NOTE — Progress Notes (Signed)
Pre visit review using our clinic review tool, if applicable. No additional management support is needed unless otherwise documented below in the visit note. 

## 2016-01-09 NOTE — Progress Notes (Signed)
Subjective:    Patient ID: Colleen BureauLinda W Cochran, female    DOB: 09/15/1945, 70 y.o.   MRN: [id number]  HPI Here for f/u from visit with Dr Birdie SonsSonnenberg this weekend for HTN  Also uri symptoms  Taking a multi symptom pill (PE)  Scratchy throat  Clearing throat  Ears itch Pressure and teeth bother her  ? If allergies  This is her time of year  A little cough this am   HTN was uncontrolled He inc her lisinopril from 10 to 20 mg daily  Also checked cmp    Chemistry      Component Value Date/Time   NA 142 01/05/2016 1400   K 3.7 01/05/2016 1400   CL 105 01/05/2016 1400   CO2 28 01/05/2016 1400   BUN 11 01/05/2016 1400   CREATININE 0.63 01/05/2016 1400      Component Value Date/Time   CALCIUM 9.8 01/05/2016 1400   ALKPHOS 62 01/05/2016 1400   AST 24 01/05/2016 1400   ALT 21 01/05/2016 1400   BILITOT 0.7 01/05/2016 1400      BP Readings from Last 3 Encounters:  01/09/16 (!) 148/90  01/05/16 (!) 160/98  04/04/15 140/85   Improved today By her cuff 134/85 (a wrist cuff)   At home she has noticed an improvement  114/76 yesterday  Last night 134/82  Highest 148/87 at 6 pm  Other than that generally the 130s  Not only when relaxed   Not exercising as much  She does watch her sodium intake  Family history    Had previously felt poorly when bp was up - kind of light headed  Not anxious or upset lately  Nothing new going on   No ibuprofen or sudafed    Some sinus symptoms   Wt Readings from Last 3 Encounters:  01/09/16 141 lb 8 oz (64.2 kg)  01/05/16 142 lb 12 oz (64.8 kg)  04/04/15 144 lb 12 oz (65.7 kg)    Not a smoker but has 2nd hand smoke exp   Patient Active Problem List   Diagnosis Date Noted  . Viral URI 01/09/2016  . Second hand smoke exposure 01/09/2016  . Estrogen deficiency 04/04/2015  . Encounter for Medicare annual wellness exam 09/30/2013  . Family history of aortic aneurysm 09/30/2013  . Other screening mammogram 11/24/2011  . Routine  general medical examination at a health care facility 11/17/2011  . Hypertension 09/24/2010  . POSTMENOPAUSAL STATUS 06/24/2010  . Vitamin D deficiency 03/02/2009  . History of colon polyps - adenomas 12/17/2007  . Hyperlipidemia 10/05/2007  . SINUSITIS, RECURRENT 10/05/2007  . ALLERGIC RHINITIS 10/05/2007  . Disorder of bone and cartilage 10/05/2007  . NEPHROLITHIASIS, HX OF 10/05/2007   Past Medical History:  Diagnosis Date  . Allergy    allergic rhinitis  . Arthritis   . History of colon polyps - adenomas 12/17/2007  . Hyperlipidemia   . Hypertension   . Kidney stone   . Nephrolithiasis    Hx of  . Osteopenia    Past Surgical History:  Procedure Laterality Date  . ABDOMINAL HYSTERECTOMY  1980   total for bleeding  . CESAREAN SECTION  1969  . COLONOSCOPY    . DILATION AND CURETTAGE OF UTERUS     x2   Social History  Substance Use Topics  . Smoking status: Passive Smoke Exposure - Never Smoker  . Smokeless tobacco: Never Used     Comment: husband smokes  . Alcohol use No  Family History  Problem Relation Age of Onset  . Hypertension Mother   . Osteoporosis Mother   . Heart disease Father     CABG,CAD,MI  . Kidney failure Father   . Osteoporosis Father     ? of OP pt had hip fx.  . Hypertension Brother   . Colon cancer Neg Hx   . Pancreatic cancer Neg Hx   . Rectal cancer Neg Hx   . Stomach cancer Neg Hx    No Known Allergies Current Outpatient Prescriptions on File Prior to Visit  Medication Sig Dispense Refill  . albuterol (VENTOLIN HFA) 108 (90 BASE) MCG/ACT inhaler inhale 1 to 2 puffs BY MOUTH INTO THE LUNGS EVERY 4 HOURS IF NEEDED 18 g 4  . aspirin 81 MG tablet Take 81 mg by mouth daily.    . Calcium Citrate-Vitamin D (CALCIUM CITRATE + D3 PO) Take 1 capsule by mouth.    . cholecalciferol (VITAMIN D) 1000 UNITS tablet Take 1,000 Units by mouth daily.      Marland Kitchen ibuprofen (ADVIL,MOTRIN) 200 MG tablet Take 200 mg by mouth every 6 (six) hours as needed.      . simvastatin (ZOCOR) 20 MG tablet Take 0.5 tablets (10 mg total) by mouth daily. 15 tablet 11   No current facility-administered medications on file prior to visit.     Review of Systems    Review of Systems  Constitutional: Negative for fever, appetite change, fatigue and unexpected weight change.  Eyes: Negative for pain and visual disturbance.  ENT pos for sinus pressure/rhinorrhea and st  Respiratory: Negative for cough and shortness of breath.   Cardiovascular: Negative for cp or palpitations    Gastrointestinal: Negative for nausea, diarrhea and constipation.  Genitourinary: Negative for urgency and frequency.  Skin: Negative for pallor or rash   Neurological: Negative for weakness, light-headedness, numbness and headaches.  Hematological: Negative for adenopathy. Does not bruise/bleed easily.  Psychiatric/Behavioral: Negative for dysphoric mood. The patient is not nervous/anxious.      Objective:   Physical Exam  Constitutional: She appears well-developed and well-nourished. No distress.  Well appearing   Pt smells strongly of smoke from 2nd hand exposure   HENT:  Head: Normocephalic and atraumatic.  Right Ear: External ear normal.  Left Ear: External ear normal.  Mouth/Throat: Oropharynx is clear and moist.  Nares are injected and congested  No sinus tenderness Clear rhinorrhea and post nasal drip  Clears throat often  Eyes: Conjunctivae and EOM are normal. Pupils are equal, round, and reactive to light. Right eye exhibits no discharge. Left eye exhibits no discharge.  Neck: Normal range of motion. Neck supple. No JVD present. Carotid bruit is not present. No thyromegaly present.  Cardiovascular: Normal rate, regular rhythm, normal heart sounds and intact distal pulses.  Exam reveals no gallop.   Pulmonary/Chest: Effort normal and breath sounds normal. No respiratory distress. She has no wheezes. She has no rales. She exhibits no tenderness.  No crackles  Abdominal:  Soft. Bowel sounds are normal. She exhibits no distension, no abdominal bruit and no mass. There is no tenderness.  Musculoskeletal: She exhibits no edema.  Lymphadenopathy:    She has no cervical adenopathy.  Neurological: She is alert. She has normal reflexes. No cranial nerve deficit.  Skin: Skin is warm and dry. No rash noted.  Psychiatric: She has a normal mood and affect.          Assessment & Plan:   Problem List Items Addressed This Visit  Cardiovascular and Mediastinum   Hypertension - Primary    bp is still not quite at goal  BP Readings from Last 1 Encounters:  01/09/16 (!) 143/80   Will add hctz 12.5 mg to her 20 mg of lisinopril Disc poss side eff in detail  Disc lifstyle change with low sodium diet and exercise  Disc proper way to check bp at home (her cuff runs a little lower as well)  She plans to exercise more  Will update if problems F/u approx 2 wk for visit and lab       Relevant Medications   lisinopril-hydrochlorothiazide (PRINZIDE,ZESTORETIC) 20-12.5 MG tablet     Respiratory   Viral URI    In day 2 with primarily pnd and runny nose  Some mild sinus pressure  Disc trial of claritin for symptoms  Nasal saline  Watch for fever/sinus pain  Try to avoid 2nd hand smoke if possible  Update if not starting to improve in a week or if worsening          Other   Second hand smoke exposure    Urged pt to enc husband to take smoke outside or quit completely in light of her bp and sinus issues        Other Visit Diagnoses    Need for influenza vaccination       Relevant Orders   Flu Vaccine QUAD 36+ mos IM (Completed)

## 2016-01-09 NOTE — Patient Instructions (Addendum)
Let's change you over to a combination pill of lisinipril hct  Tonight take 10 mg lisinopril  In the am take lisinopril 20-12.5 and then continue 1 pill daily in the am  If any side effects or problems please let me know  Follow up with me in approx 2 weeks for visit and labs   Only check your blood pressure when relaxed and not right after exercise   Take care of yourself

## 2016-01-23 ENCOUNTER — Ambulatory Visit (INDEPENDENT_AMBULATORY_CARE_PROVIDER_SITE_OTHER): Payer: Medicare Other | Admitting: Family Medicine

## 2016-01-23 ENCOUNTER — Encounter: Payer: Self-pay | Admitting: Family Medicine

## 2016-01-23 VITALS — BP 118/70 | HR 75 | Temp 97.8°F | Ht 65.25 in | Wt 143.5 lb

## 2016-01-23 DIAGNOSIS — I10 Essential (primary) hypertension: Secondary | ICD-10-CM | POA: Diagnosis not present

## 2016-01-23 LAB — BASIC METABOLIC PANEL
BUN: 15 mg/dL (ref 6–23)
CALCIUM: 9.1 mg/dL (ref 8.4–10.5)
CO2: 31 mEq/L (ref 19–32)
Chloride: 101 mEq/L (ref 96–112)
Creatinine, Ser: 0.8 mg/dL (ref 0.40–1.20)
GFR: 75.57 mL/min (ref >60.00)
GLUCOSE: 78 mg/dL (ref 70–99)
Potassium: 4.1 mEq/L (ref 3.5–5.1)
Sodium: 139 mEq/L (ref 135–145)

## 2016-01-23 NOTE — Progress Notes (Signed)
Subjective:    Patient ID: Colleen Cochran, female    DOB: 07/27/1945, 70 y.o.   MRN: [id number]  HPI Here for f/u of HTN   BP Readings from Last 3 Encounters:  01/23/16 132/76  01/09/16 (!) 143/80  01/05/16 (!) 160/98  improved Also improved at home -- usually one teens/70s  Last visit we added 12.5 mg of HCTZ Is on lisinopril   Event better on 2nd check   BP: 118/70   On her cuff 117/75  Wt Readings from Last 3 Encounters:  01/23/16 143 lb 8 oz (65.1 kg)  01/09/16 141 lb 8 oz (64.2 kg)  01/05/16 142 lb 12 oz (64.8 kg)   bmi is 23.7  No changes in her habits /diet/exercise   Patient Active Problem List   Diagnosis Date Noted  . Second hand smoke exposure 01/09/2016  . Estrogen deficiency 04/04/2015  . Encounter for Medicare annual wellness exam 09/30/2013  . Family history of aortic aneurysm 09/30/2013  . Other screening mammogram 11/24/2011  . Routine general medical examination at a health care facility 11/17/2011  . Hypertension 09/24/2010  . POSTMENOPAUSAL STATUS 06/24/2010  . Vitamin D deficiency 03/02/2009  . History of colon polyps - adenomas 12/17/2007  . Hyperlipidemia 10/05/2007  . SINUSITIS, RECURRENT 10/05/2007  . ALLERGIC RHINITIS 10/05/2007  . Disorder of bone and cartilage 10/05/2007  . NEPHROLITHIASIS, HX OF 10/05/2007   Past Medical History:  Diagnosis Date  . Allergy    allergic rhinitis  . Arthritis   . History of colon polyps - adenomas 12/17/2007  . Hyperlipidemia   . Hypertension   . Kidney stone   . Nephrolithiasis    Hx of  . Osteopenia    Past Surgical History:  Procedure Laterality Date  . ABDOMINAL HYSTERECTOMY  1980   total for bleeding  . CESAREAN SECTION  1969  . COLONOSCOPY    . DILATION AND CURETTAGE OF UTERUS     x2   Social History  Substance Use Topics  . Smoking status: Passive Smoke Exposure - Never Smoker  . Smokeless tobacco: Never Used     Comment: husband smokes  . Alcohol use No   Family History    Problem Relation Age of Onset  . Hypertension Mother   . Osteoporosis Mother   . Heart disease Father     CABG,CAD,MI  . Kidney failure Father   . Osteoporosis Father     ? of OP pt had hip fx.  . Hypertension Brother   . Colon cancer Neg Hx   . Pancreatic cancer Neg Hx   . Rectal cancer Neg Hx   . Stomach cancer Neg Hx    No Known Allergies Current Outpatient Prescriptions on File Prior to Visit  Medication Sig Dispense Refill  . albuterol (VENTOLIN HFA) 108 (90 BASE) MCG/ACT inhaler inhale 1 to 2 puffs BY MOUTH INTO THE LUNGS EVERY 4 HOURS IF NEEDED 18 g 4  . aspirin 81 MG tablet Take 81 mg by mouth daily.    . Calcium Citrate-Vitamin D (CALCIUM CITRATE + D3 PO) Take 1 capsule by mouth.    . cholecalciferol (VITAMIN D) 1000 UNITS tablet Take 1,000 Units by mouth daily.      Marland Kitchen. ibuprofen (ADVIL,MOTRIN) 200 MG tablet Take 200 mg by mouth every 6 (six) hours as needed.    Marland Kitchen. lisinopril-hydrochlorothiazide (PRINZIDE,ZESTORETIC) 20-12.5 MG tablet Take 1 tablet by mouth daily. 30 tablet 11  . simvastatin (ZOCOR) 20 MG tablet Take 0.5 tablets (  10 mg total) by mouth daily. 15 tablet 11   No current facility-administered medications on file prior to visit.      Review of Systems Review of Systems  Constitutional: Negative for fever, appetite change, fatigue and unexpected weight change.  Eyes: Negative for pain and visual disturbance.  ENT pos for nasal allergy symptoms intermittent  Respiratory: Negative for cough and shortness of breath.   Cardiovascular: Negative for cp or palpitations    Gastrointestinal: Negative for nausea, diarrhea and constipation.  Genitourinary: Negative for urgency and frequency.  Skin: Negative for pallor or rash   Neurological: Negative for weakness, numbness and headaches. pos for an episode of lt headedness that resolved  Hematological: Negative for adenopathy. Does not bruise/bleed easily.  Psychiatric/Behavioral: Negative for dysphoric mood. The  patient is not nervous/anxious.         Objective:   Physical Exam  Constitutional: She appears well-developed and well-nourished. No distress.  Well appearing   HENT:  Head: Normocephalic and atraumatic.  Mouth/Throat: Oropharynx is clear and moist.  Eyes: Conjunctivae and EOM are normal. Pupils are equal, round, and reactive to light.  Neck: Normal range of motion. Neck supple. No JVD present. Carotid bruit is not present. No thyromegaly present.  Cardiovascular: Normal rate, regular rhythm, normal heart sounds and intact distal pulses.  Exam reveals no gallop.   Pulmonary/Chest: Effort normal and breath sounds normal. No respiratory distress. She has no wheezes. She has no rales.  No crackles  Abdominal: She exhibits no distension and no abdominal bruit.  Musculoskeletal: She exhibits no edema.  Lymphadenopathy:    She has no cervical adenopathy.  Neurological: She is alert. She has normal reflexes.  Skin: Skin is warm and dry. No rash noted.  Psychiatric: She has a normal mood and affect.          Assessment & Plan:   Problem List Items Addressed This Visit      Cardiovascular and Mediastinum   Hypertension - Primary    Much improved bp  Will watch for any s/s of hypotension  Lab today for hctz Given info on DASH eating plan  Disc plan for regular exercise      Relevant Orders   Basic metabolic panel    Other Visit Diagnoses   None.

## 2016-01-23 NOTE — Patient Instructions (Addendum)
Blood pressure is good  Continue current medicines Think about starting an exercise program  Labs today  [facility]   Update me if any problems or side effects

## 2016-01-23 NOTE — Progress Notes (Signed)
Pre visit review using our clinic review tool, if applicable. No additional management support is needed unless otherwise documented below in the visit note. 

## 2016-01-23 NOTE — Assessment & Plan Note (Signed)
Much improved bp  Will watch for any s/s of hypotension  Lab today for hctz Given info on DASH eating plan  Disc plan for regular exercise

## 2016-01-28 ENCOUNTER — Telehealth: Payer: Self-pay

## 2016-01-28 NOTE — Telephone Encounter (Signed)
Patient advised.

## 2016-01-28 NOTE — Telephone Encounter (Signed)
Hold the next dose of lisinopril hct  When feeling better and bp is back up - change to 1/2 pill daily (split the lisinopril hct in half)  Then update me with bp and how you are feeling

## 2016-01-28 NOTE — Telephone Encounter (Signed)
Pt left v/m; pt was seen 01/23/16 and BP med was changed; pts BP is lower; this AM BP 86/58 and this afternoon 102/56. Pt feeling very tired and her energy is zapped; pt request cb with what to do. [facility] [address]

## 2016-03-05 ENCOUNTER — Other Ambulatory Visit: Payer: Self-pay | Admitting: Family Medicine

## 2016-04-02 ENCOUNTER — Other Ambulatory Visit: Payer: Self-pay | Admitting: Family Medicine

## 2016-04-04 ENCOUNTER — Other Ambulatory Visit: Payer: Self-pay | Admitting: Family Medicine

## 2016-04-07 ENCOUNTER — Ambulatory Visit (INDEPENDENT_AMBULATORY_CARE_PROVIDER_SITE_OTHER): Payer: Medicare Other | Admitting: Primary Care

## 2016-04-07 ENCOUNTER — Encounter: Payer: Self-pay | Admitting: Primary Care

## 2016-04-07 VITALS — BP 140/86 | HR 94 | Temp 98.0°F | Ht 65.25 in | Wt 144.0 lb

## 2016-04-07 DIAGNOSIS — R350 Frequency of micturition: Secondary | ICD-10-CM

## 2016-04-07 DIAGNOSIS — B373 Candidiasis of vulva and vagina: Secondary | ICD-10-CM | POA: Diagnosis not present

## 2016-04-07 DIAGNOSIS — B3731 Acute candidiasis of vulva and vagina: Secondary | ICD-10-CM

## 2016-04-07 LAB — POC URINALSYSI DIPSTICK (AUTOMATED)
Bilirubin, UA: NEGATIVE
Blood, UA: NEGATIVE
Glucose, UA: NEGATIVE
KETONES UA: NEGATIVE
LEUKOCYTES UA: NEGATIVE
Nitrite, UA: NEGATIVE
Protein, UA: NEGATIVE
Spec Grav, UA: 1.015
Urobilinogen, UA: NEGATIVE
pH, UA: 8.5

## 2016-04-07 LAB — POCT WET PREP WITH KOH: TRICHOMONAS UA: NEGATIVE

## 2016-04-07 MED ORDER — FLUCONAZOLE 150 MG PO TABS: ORAL_TABLET | ORAL | 0 refills | Status: DC

## 2016-04-07 NOTE — Progress Notes (Signed)
Subjective:    Patient ID: Colleen Cochran, female    DOB: 10/18/1945, 70 y.o.   MRN: [id number]  HPI  Colleen Cochran is a 70 year old female who presents today with a chief complaint of dysuria. She also reports urinary frequency. Her symptoms have been present for the past 1 week. She denies fevers, hematuria, vaginal discharge, vaginal itching. She did try Monistat for 3 days with temporary improvement. She will be traveling 12 hours in a car tomorrow.  Review of Systems  Constitutional: Negative for chills, fatigue and fever.  Gastrointestinal: Positive for abdominal distention. Negative for abdominal pain, constipation, diarrhea, nausea and vomiting.  Genitourinary: Positive for dysuria and frequency. Negative for flank pain, hematuria and vaginal discharge.       Past Medical History:  Diagnosis Date  . Allergy    allergic rhinitis  . Arthritis   . History of colon polyps - adenomas 12/17/2007  . Hyperlipidemia   . Hypertension   . Kidney stone   . Nephrolithiasis    Hx of  . Osteopenia      Social History   Social History  . Marital status: Married    Spouse name: N/A  . Number of children: N/A  . Years of education: N/A   Occupational History  . Not on file.   Social History Main Topics  . Smoking status: Passive Smoke Exposure - Never Smoker  . Smokeless tobacco: Never Used     Comment: husband smokes  . Alcohol use No  . Drug use: No  . Sexual activity: Not on file   Other Topics Concern  . Not on file   Social History Narrative  . No narrative on file    Past Surgical History:  Procedure Laterality Date  . ABDOMINAL HYSTERECTOMY  1980   total for bleeding  . CESAREAN SECTION  1969  . COLONOSCOPY    . DILATION AND CURETTAGE OF UTERUS     x2    Family History  Problem Relation Age of Onset  . Hypertension Mother   . Osteoporosis Mother   . Heart disease Father     CABG,CAD,MI  . Kidney failure Father   . Osteoporosis Father     ? of OP pt  had hip fx.  . Hypertension Brother   . Colon cancer Neg Hx   . Pancreatic cancer Neg Hx   . Rectal cancer Neg Hx   . Stomach cancer Neg Hx     No Known Allergies  Current Outpatient Prescriptions on File Prior to Visit  Medication Sig Dispense Refill  . aspirin 81 MG tablet Take 81 mg by mouth daily.    . Calcium Citrate-Vitamin D (CALCIUM CITRATE + D3 PO) Take 1 capsule by mouth.    . cholecalciferol (VITAMIN D) 1000 UNITS tablet Take 1,000 Units by mouth daily.      Marland Kitchen. ibuprofen (ADVIL,MOTRIN) 200 MG tablet Take 200 mg by mouth every 6 (six) hours as needed.    Marland Kitchen. lisinopril-hydrochlorothiazide (PRINZIDE,ZESTORETIC) 20-12.5 MG tablet Take 1 tablet by mouth daily. (Patient taking differently: Take 0.5 tablets by mouth daily. ) 30 tablet 11  . simvastatin (ZOCOR) 20 MG tablet take 1/2 tablet by mouth once daily 15 tablet 2  . VENTOLIN HFA 108 (90 Base) MCG/ACT inhaler inhale 1 to 2 puffs by mouth every 4 hours if needed 18 g 2   No current facility-administered medications on file prior to visit.     BP 140/86   Pulse  94   Temp 98 F (36.7 C) (Oral)   Ht 5' 5.25" (1.657 m)   Wt 144 lb (65.3 kg)   SpO2 98%   BMI 23.78 kg/m    Objective:   Physical Exam  Constitutional: She appears well-nourished.  Neck: Neck supple.  Cardiovascular: Normal rate and regular rhythm.   Pulmonary/Chest: Effort normal and breath sounds normal.  Abdominal: Soft. Bowel sounds are normal. There is tenderness in the suprapubic area. There is no CVA tenderness.  Genitourinary: There is no tenderness or lesion on the right labia. There is no tenderness or lesion on the left labia. No vaginal discharge found.  Genitourinary Comments: Unable to complete pelvic exam due to discomfort with speculum. Mild cystocele noted.  Skin: Skin is warm and dry.          Assessment & Plan:  Dysuria:  Also with urinary frequency. Temporary improvement with OTC Monistat. Exam today with mild tenderness to  suprapubic region. No CVA tenderness. UA: Negative for leuks, nitrites, blood, glucose. Wet Prep: Many yeast cells. No Clue cells. Rx for Fluconazole 150 mg to pharmacy. Discussed to increase consumption of water. May take second tablet in 3 days if no resolve. Follow up PRN.  Morrie Sheldonlark,Katherine Kendal, NP

## 2016-04-07 NOTE — Patient Instructions (Signed)
You have a vaginal yeast infection which is likely attributing to your symptoms.  Start Fluconazole 150 mg tablets. Take 1 tablet by mouth once. May repeat in 3 days if no resolve.  Ensure you are staying hydrated with water and rest.  It was a pleasure meeting you!

## 2016-04-07 NOTE — Progress Notes (Signed)
Pre visit review using our clinic review tool, if applicable. No additional management support is needed unless otherwise documented below in the visit note. 

## 2016-04-09 ENCOUNTER — Other Ambulatory Visit: Payer: Medicare Other

## 2016-04-09 ENCOUNTER — Ambulatory Visit: Payer: Medicare Other

## 2016-04-15 ENCOUNTER — Encounter: Payer: Medicare Other | Admitting: Family Medicine

## 2016-05-03 ENCOUNTER — Telehealth: Payer: Self-pay | Admitting: Family Medicine

## 2016-05-03 DIAGNOSIS — E559 Vitamin D deficiency, unspecified: Secondary | ICD-10-CM

## 2016-05-03 DIAGNOSIS — Z0000 Encounter for general adult medical examination without abnormal findings: Secondary | ICD-10-CM

## 2016-05-03 DIAGNOSIS — Z1159 Encounter for screening for other viral diseases: Secondary | ICD-10-CM

## 2016-05-03 NOTE — Telephone Encounter (Signed)
-----   Message from Robert Bellowarlesia R Pinson, LPN sent at [id number]/13/2018  5:37 PM EST ----- Regarding: Lab Orders 05/07/16 Please add Hep C to lab orders, if any. Thank you.

## 2016-05-07 ENCOUNTER — Ambulatory Visit: Payer: Medicare Other

## 2016-05-13 ENCOUNTER — Other Ambulatory Visit: Payer: Self-pay | Admitting: Family Medicine

## 2016-05-13 ENCOUNTER — Encounter: Payer: Self-pay | Admitting: Family Medicine

## 2016-05-13 ENCOUNTER — Ambulatory Visit (INDEPENDENT_AMBULATORY_CARE_PROVIDER_SITE_OTHER): Payer: Medicare Other | Admitting: Family Medicine

## 2016-05-13 VITALS — BP 122/76 | HR 78 | Temp 98.8°F | Ht 65.25 in | Wt 141.8 lb

## 2016-05-13 DIAGNOSIS — E559 Vitamin D deficiency, unspecified: Secondary | ICD-10-CM | POA: Diagnosis not present

## 2016-05-13 DIAGNOSIS — M949 Disorder of cartilage, unspecified: Secondary | ICD-10-CM

## 2016-05-13 DIAGNOSIS — I10 Essential (primary) hypertension: Secondary | ICD-10-CM | POA: Diagnosis not present

## 2016-05-13 DIAGNOSIS — Z0000 Encounter for general adult medical examination without abnormal findings: Secondary | ICD-10-CM

## 2016-05-13 DIAGNOSIS — E7800 Pure hypercholesterolemia, unspecified: Secondary | ICD-10-CM

## 2016-05-13 DIAGNOSIS — M899 Disorder of bone, unspecified: Secondary | ICD-10-CM | POA: Diagnosis not present

## 2016-05-13 DIAGNOSIS — Z1159 Encounter for screening for other viral diseases: Secondary | ICD-10-CM | POA: Diagnosis not present

## 2016-05-13 DIAGNOSIS — Z1231 Encounter for screening mammogram for malignant neoplasm of breast: Secondary | ICD-10-CM

## 2016-05-13 LAB — CBC WITH DIFFERENTIAL/PLATELET
BASOS ABS: 0 10*3/uL (ref 0.0–0.1)
BASOS PCT: 0.5 % (ref 0.0–3.0)
EOS PCT: 1.9 % (ref 0.0–5.0)
Eosinophils Absolute: 0.1 10*3/uL (ref 0.0–0.7)
HEMATOCRIT: 39.4 % (ref 36.0–46.0)
Hemoglobin: 13.3 g/dL (ref 12.0–15.0)
LYMPHS PCT: 24 % (ref 12.0–46.0)
Lymphs Abs: 1.4 10*3/uL (ref 0.7–4.0)
MCHC: 33.7 g/dL (ref 30.0–36.0)
MCV: 90 fl (ref 78.0–100.0)
MONOS PCT: 8.1 % (ref 3.0–12.0)
Monocytes Absolute: 0.5 10*3/uL (ref 0.1–1.0)
NEUTROS ABS: 3.8 10*3/uL (ref 1.4–7.7)
Neutrophils Relative %: 65.5 % (ref 43.0–77.0)
PLATELETS: 372 10*3/uL (ref 150.0–400.0)
RBC: 4.38 Mil/uL (ref 3.87–5.11)
RDW: 13.4 % (ref 11.5–15.5)
WBC: 5.7 10*3/uL (ref 4.0–10.5)

## 2016-05-13 LAB — LIPID PANEL
Cholesterol: 163 mg/dL (ref 0–200)
HDL: 46.5 mg/dL (ref >39.00)
LDL Cholesterol: 93 mg/dL (ref 0–99)
NonHDL: 116.36
TRIGLYCERIDES: 118 mg/dL (ref 0.0–149.0)
Total CHOL/HDL Ratio: 4
VLDL: 23.6 mg/dL (ref 0.0–40.0)

## 2016-05-13 LAB — COMPREHENSIVE METABOLIC PANEL
ALK PHOS: 58 U/L (ref 39–117)
ALT: 29 U/L (ref 0–35)
AST: 25 U/L (ref 0–37)
Albumin: 4.5 g/dL (ref 3.5–5.2)
BILIRUBIN TOTAL: 0.4 mg/dL (ref 0.2–1.2)
BUN: 13 mg/dL (ref 6–23)
CALCIUM: 9.8 mg/dL (ref 8.4–10.5)
CO2: 32 meq/L (ref 19–32)
Chloride: 103 mEq/L (ref 96–112)
Creatinine, Ser: 0.62 mg/dL (ref 0.40–1.20)
GFR: 101.33 mL/min (ref >60.00)
Glucose, Bld: 85 mg/dL (ref 70–99)
Potassium: 3.8 mEq/L (ref 3.5–5.1)
Sodium: 140 mEq/L (ref 135–145)
TOTAL PROTEIN: 7.9 g/dL (ref 6.0–8.3)

## 2016-05-13 LAB — TSH: TSH: 0.6 u[IU]/mL (ref 0.35–4.50)

## 2016-05-13 LAB — VITAMIN D 25 HYDROXY (VIT D DEFICIENCY, FRACTURES): VITD: 45.13 ng/mL (ref 30.00–100.00)

## 2016-05-13 MED ORDER — SIMVASTATIN 20 MG PO TABS: 10.0000 mg | ORAL_TABLET | Freq: Every day | ORAL | 11 refills | Status: DC

## 2016-05-13 MED ORDER — LISINOPRIL-HYDROCHLOROTHIAZIDE 20-12.5 MG PO TABS: 0.5000 | ORAL_TABLET | Freq: Every day | ORAL | 11 refills | Status: DC

## 2016-05-13 NOTE — Progress Notes (Signed)
Pre visit review using our clinic review tool, if applicable. No additional management support is needed unless otherwise documented below in the visit note. 

## 2016-05-13 NOTE — Patient Instructions (Addendum)
Get back to a regular exercise program (have an indoor and outdoor alternative)  Don't forget to schedule your mammogram  We will sign you up for the cologuard screening program  Labs today   We will schedule visit for your medicare interview at check out

## 2016-05-13 NOTE — Progress Notes (Signed)
Subjective:    Patient ID: Colleen Cochran, female    DOB: May 29, 1945, 71 y.o.   MRN: [id number]  HPI Here for health maintenance exam and to review chronic medical problems   Has been feeling ok overall  Recent uri symptoms  Used otc meds -inc mucinex   Still coughing -esp at night No sob   Has not had AMW yet   Wt Readings from Last 3 Encounters:  05/13/16 141 lb 12 oz (64.3 kg)  04/07/16 144 lb (65.3 kg)  01/23/16 143 lb 8 oz (65.1 kg)  wt is down 4 lb  Pt states she eats regularly , no regular exercise (wants to start an exercise program)  bmi is 23.4  No gyn problems Recent yeast infection treated  No hx of abx paps    Hep C screen - will do today  She did have a blood transfusion in the past - before 1980   Mammogram 1/17=normal  Due for this  No lumps on self exam  Will set up her own at [facility]   Tetanus shot 6/09 Zoster vaccine 8/12 PNA vaccines complete Flu shot 9/17  Colonoscopy 8/15= 10 year recall  She will be over 76- ? If will do it  Will sign up for cologuard today  dexa 1/17 - osteopenia slt worse at [facility] / stable at the spine  No falls or fractures - is cautious not to fall  Hx of vit D def  She is taking her ca and D  Not exercise   Due for labs   bp is stable today  No cp or palpitations or headaches or edema  No side effects to medicines  BP Readings from Last 3 Encounters:  05/13/16 122/76  04/07/16 140/86  01/23/16 118/70     Hx of hyperlipidemia Lab Results  Component Value Date   CHOL 154 03/30/2015   HDL 44.30 03/30/2015   LDLCALC 88 03/30/2015   LDLDIRECT 179.6 03/02/2009   TRIG 108.0 03/30/2015   CHOLHDL 3 03/30/2015   Needs labs  On zocor 20 - no missed doses    Patient Active Problem List   Diagnosis Date Noted  . Need for hepatitis C screening test 05/03/2016  . Second hand smoke exposure 01/09/2016  . Estrogen deficiency 04/04/2015  . Encounter for Medicare annual wellness exam 09/30/2013  . Family  history of aortic aneurysm 09/30/2013  . Other screening mammogram 11/24/2011  . Routine general medical examination at a health care facility 11/17/2011  . Hypertension 09/24/2010  . POSTMENOPAUSAL STATUS 06/24/2010  . Vitamin D deficiency 03/02/2009  . History of colon polyps - adenomas 12/17/2007  . Hyperlipidemia 10/05/2007  . SINUSITIS, RECURRENT 10/05/2007  . ALLERGIC RHINITIS 10/05/2007  . Disorder of bone and cartilage 10/05/2007  . NEPHROLITHIASIS, HX OF 10/05/2007   Past Medical History:  Diagnosis Date  . Allergy    allergic rhinitis  . Arthritis   . History of colon polyps - adenomas 12/17/2007  . Hyperlipidemia   . Hypertension   . Kidney stone   . Nephrolithiasis    Hx of  . Osteopenia    Past Surgical History:  Procedure Laterality Date  . ABDOMINAL HYSTERECTOMY  1980   total for bleeding  . CESAREAN SECTION  1969  . COLONOSCOPY    . DILATION AND CURETTAGE OF UTERUS     x2   Social History  Substance Use Topics  . Smoking status: Passive Smoke Exposure - Never Smoker  . Smokeless tobacco:  Never Used     Comment: husband smokes  . Alcohol use No   Family History  Problem Relation Age of Onset  . Hypertension Mother   . Osteoporosis Mother   . Heart disease Father     CABG,CAD,MI  . Kidney failure Father   . Osteoporosis Father     ? of OP pt had hip fx.  . Hypertension Brother   . Colon cancer Neg Hx   . Pancreatic cancer Neg Hx   . Rectal cancer Neg Hx   . Stomach cancer Neg Hx    No Known Allergies Current Outpatient Prescriptions on File Prior to Visit  Medication Sig Dispense Refill  . aspirin 81 MG tablet Take 81 mg by mouth daily.    . Calcium Citrate-Vitamin D (CALCIUM CITRATE + D3 PO) Take 1 capsule by mouth.    . cholecalciferol (VITAMIN D) 1000 UNITS tablet Take 1,000 Units by mouth daily.      Marland Kitchen. ibuprofen (ADVIL,MOTRIN) 200 MG tablet Take 200 mg by mouth every 6 (six) hours as needed.    . VENTOLIN HFA 108 (90 Base) MCG/ACT  inhaler inhale 1 to 2 puffs by mouth every 4 hours if needed 18 g 2   No current facility-administered medications on file prior to visit.     Review of Systems Review of Systems  Constitutional: Negative for fever, appetite change, fatigue and unexpected weight change.  Eyes: Negative for pain and visual disturbance.  Respiratory: Negative for wheeze  and shortness of breath.  (pos for improved cough) Cardiovascular: Negative for cp or palpitations    Gastrointestinal: Negative for nausea, diarrhea and constipation.  Genitourinary: Negative for urgency and frequency.  Skin: Negative for pallor or rash   Neurological: Negative for weakness, light-headedness, numbness and headaches.  Hematological: Negative for adenopathy. Does not bruise/bleed easily.  Psychiatric/Behavioral: Negative for dysphoric mood. The patient is not nervous/anxious.         Objective:   Physical Exam  Constitutional: She appears well-developed and well-nourished. No distress.  Well appearing   HENT:  Head: Normocephalic and atraumatic.  Right Ear: External ear normal.  Left Ear: External ear normal.  Mouth/Throat: Oropharynx is clear and moist.  Eyes: Conjunctivae and EOM are normal. Pupils are equal, round, and reactive to light. No scleral icterus.  Neck: Normal range of motion. Neck supple. No JVD present. Carotid bruit is not present. No thyromegaly present.  Cardiovascular: Normal rate, regular rhythm, normal heart sounds and intact distal pulses.  Exam reveals no gallop.   Pulmonary/Chest: Effort normal and breath sounds normal. No respiratory distress. She has no wheezes. She exhibits no tenderness.  Abdominal: Soft. Bowel sounds are normal. She exhibits no distension, no abdominal bruit and no mass. There is no tenderness.  Genitourinary: No breast swelling, tenderness, discharge or bleeding.  Genitourinary Comments: Breast exam: No mass, nodules, thickening, tenderness, bulging, retraction,  inflamation, nipple discharge or skin changes noted.  No axillary or clavicular LA.      Musculoskeletal: Normal range of motion. She exhibits no edema or tenderness.  No kyphosis   Lymphadenopathy:    She has no cervical adenopathy.  Neurological: She is alert. She has normal reflexes. No cranial nerve deficit. She exhibits normal muscle tone. Coordination normal.  Skin: Skin is warm and dry. No rash noted. No erythema. No pallor.  Lentigines diffusely  Psychiatric: She has a normal mood and affect.          Assessment & Plan:   Problem  List Items Addressed This Visit      Cardiovascular and Mediastinum   Hypertension - Primary    bp in fair control at this time  BP Readings from Last 1 Encounters:  05/13/16 122/76   No changes needed Disc lifstyle change with low sodium diet and exercise  Labs ordered      Relevant Medications   simvastatin (ZOCOR) 20 MG tablet   lisinopril-hydrochlorothiazide (PRINZIDE,ZESTORETIC) 20-12.5 MG tablet     Musculoskeletal and Integument   Disorder of bone and cartilage    dexa 1/17 slt worse at [facility] No falls or fx  Taking ca and D- D level today  Enc exercise  Re check in a year         Other   Hyperlipidemia    Disc goals for lipids and reasons to control them Rev labs with pt-2016 Rev low sat fat diet in detail  On zocor and diet  Labs today      Relevant Medications   simvastatin (ZOCOR) 20 MG tablet   lisinopril-hydrochlorothiazide (PRINZIDE,ZESTORETIC) 20-12.5 MG tablet   Need for hepatitis C screening test    Low risk  Screen with labs today      Routine general medical examination at a health care facility    Reviewed health habits including diet and exercise and skin cancer prevention Reviewed appropriate screening tests for age  Also reviewed health mt list, fam hx and immunization status , as well as social and family history   See HPI Labs ordered today incl hep C screen  Plan AMW visit  Signed up for  cologuard screening test  imms utd Breast cancer screen utd  Exercise enc      Vitamin D deficiency    With osteopenia Labs today

## 2016-05-14 LAB — HEPATITIS C ANTIBODY: HCV Ab: NEGATIVE

## 2016-05-14 NOTE — Assessment & Plan Note (Signed)
Low risk  Screen with labs today

## 2016-05-14 NOTE — Assessment & Plan Note (Signed)
Disc goals for lipids and reasons to control them Rev labs with pt-2016 Rev low sat fat diet in detail  On zocor and diet  Labs today

## 2016-05-14 NOTE — Assessment & Plan Note (Signed)
With osteopenia Labs today

## 2016-05-14 NOTE — Assessment & Plan Note (Signed)
dexa 1/17 slt worse at [facility] No falls or fx  Taking ca and D- D level today  Enc exercise  Re check in a year

## 2016-05-14 NOTE — Assessment & Plan Note (Signed)
Reviewed health habits including diet and exercise and skin cancer prevention Reviewed appropriate screening tests for age  Also reviewed health mt list, fam hx and immunization status , as well as social and family history   See HPI Labs ordered today incl hep C screen  Plan AMW visit  Signed up for cologuard screening test  imms utd Breast cancer screen utd  Exercise enc

## 2016-05-14 NOTE — Assessment & Plan Note (Signed)
bp in fair control at this time  BP Readings from Last 1 Encounters:  05/13/16 122/76   No changes needed Disc lifstyle change with low sodium diet and exercise  Labs ordered

## 2016-05-27 ENCOUNTER — Ambulatory Visit (INDEPENDENT_AMBULATORY_CARE_PROVIDER_SITE_OTHER): Payer: Medicare Other

## 2016-05-27 VITALS — BP 118/80 | HR 82 | Temp 98.5°F | Ht 65.0 in | Wt 143.5 lb

## 2016-05-27 DIAGNOSIS — Z0000 Encounter for general adult medical examination without abnormal findings: Secondary | ICD-10-CM | POA: Diagnosis not present

## 2016-05-27 NOTE — Patient Instructions (Signed)
[name] , Thank you for taking time to come for your Medicare Wellness Visit. I appreciate your ongoing commitment to your health goals. Please review the following plan we discussed and let me know if I can assist you in the future.   These are the goals we discussed: Goals    . Increase physical activity          Starting 05/27/2016, I will attempt to exercise at least 30 min 5 days per week. Also, I will decrease intake of fried foods to one meal per week.        This is a list of the screening recommended for you and due dates:  Health Maintenance  Topic Date Due  . Mammogram  05/08/2017  . Tetanus Vaccine  10/05/2017  . Colon Cancer Screening  12/13/2023  . Flu Shot  Completed  . DEXA scan (bone density measurement)  Completed  . Shingles Vaccine  Completed  .  Hepatitis C: One time screening is recommended by Center for Disease Control  (CDC) for  adults born from [birth year] through 1965.   Completed  . Pneumonia vaccines  Completed   Preventive Care for Adults  A healthy lifestyle and preventive care can promote health and wellness. Preventive health guidelines for adults include the following key practices.  . A routine yearly physical is a good way to check with your health care provider about your health and preventive screening. It is a chance to share any concerns and updates on your health and to receive a thorough exam.  . Visit your dentist for a routine exam and preventive care every 6 months. Brush your teeth twice a day and floss once a day. Good oral hygiene prevents tooth decay and gum disease.  . The frequency of eye exams is based on your age, health, family medical history, use  of contact lenses, and other factors. Follow your health care provider's ecommendations for frequency of eye exams.  . Eat a healthy diet. Foods like vegetables, fruits, whole grains, low-fat dairy products, and lean protein foods contain the nutrients you need without too many calories.  Decrease your intake of foods high in solid fats, added sugars, and salt. Eat the right amount of calories for you. Get information about a proper diet from your health care provider, if necessary.  . Regular physical exercise is one of the most important things you can do for your health. Most adults should get at least 150 minutes of moderate-intensity exercise (any activity that increases your heart rate and causes you to sweat) each week. In addition, most adults need muscle-strengthening exercises on 2 or more days a week.  Silver Sneakers may be a benefit available to you. To determine eligibility, you may visit the website: www.silversneakers.com or contact program at [phone])[phone] Mon-Fri between 8AM-8PM.   . Maintain a healthy weight. The body mass index (BMI) is a screening tool to identify possible weight problems. It provides an estimate of body fat based on height and weight. Your health care provider can find your BMI and can help you achieve or maintain a healthy weight.   For adults 20 years and older: ? A BMI below 18.5 is considered underweight. ? A BMI of 18.5 to 24.9 is normal. ? A BMI of 25 to 29.9 is considered overweight. ? A BMI of 30 and above is considered obese.   . Maintain normal blood lipids and cholesterol levels by exercising and minimizing your intake of saturated fat. Eat a  balanced diet with plenty of fruit and vegetables. Blood tests for lipids and cholesterol should begin at age 27 and be repeated every 5 years. If your lipid or cholesterol levels are high, you are over 50, or you are at high risk for heart disease, you may need your cholesterol levels checked more frequently. Ongoing high lipid and cholesterol levels should be treated with medicines if diet and exercise are not working.  . If you smoke, find out from your health care provider how to quit. If you do not use tobacco, please do not start.  . If you choose to drink alcohol, please do not consume  more than 2 drinks per day. One drink is considered to be 12 ounces (355 mL) of beer, 5 ounces (148 mL) of wine, or 1.5 ounces (44 mL) of liquor.  . If you are 52-59 years old, ask your health care provider if you should take aspirin to prevent strokes.  . Use sunscreen. Apply sunscreen liberally and repeatedly throughout the day. You should seek shade when your shadow is shorter than you. Protect yourself by wearing long sleeves, pants, a wide-brimmed hat, and sunglasses year round, whenever you are outdoors.  . Once a month, do a whole body skin exam, using a mirror to look at the skin on your back. Tell your health care provider of new moles, moles that have irregular borders, moles that are larger than a pencil eraser, or moles that have changed in shape or color.

## 2016-05-27 NOTE — Progress Notes (Signed)
Subjective:   Colleen Cochran is a 71 y.o. female who presents for Medicare Annual (Subsequent) preventive examination.  Review of Systems:  N/A Cardiac Risk Factors include: advanced age (>70men, >13 women);dyslipidemia;hypertension;smoking/ tobacco exposure     Objective:     Vitals: BP 118/80 (BP Location: Right Arm, Patient Position: Sitting, Cuff Size: Normal)   Pulse 82   Temp 98.5 F (36.9 C) (Oral)   Ht 5\' 5"  (1.651 m)   Wt 143 lb 8 oz (65.1 kg)   SpO2 95%   BMI 23.88 kg/m   Body mass index is 23.88 kg/m.   Tobacco History  Smoking Status  . Passive Smoke Exposure - Never Smoker  Smokeless Tobacco  . Never Used    Comment: husband smokes     Counseling given: No   Past Medical History:  Diagnosis Date  . Allergy    allergic rhinitis  . Arthritis   . History of colon polyps - adenomas 12/17/2007  . Hyperlipidemia   . Hypertension   . Kidney stone   . Nephrolithiasis    Hx of  . Osteopenia    Past Surgical History:  Procedure Laterality Date  . ABDOMINAL HYSTERECTOMY  1980   total for bleeding  . CESAREAN SECTION  1969  . COLONOSCOPY    . DILATION AND CURETTAGE OF UTERUS     x2   Family History  Problem Relation Age of Onset  . Hypertension Mother   . Osteoporosis Mother   . Heart disease Father     CABG,CAD,MI  . Kidney failure Father   . Osteoporosis Father     ? of OP pt had hip fx.  . Hypertension Brother   . Colon cancer Neg Hx   . Pancreatic cancer Neg Hx   . Rectal cancer Neg Hx   . Stomach cancer Neg Hx    History  Sexual Activity  . Sexual activity: No    Outpatient Encounter Prescriptions as of 05/27/2016  Medication Sig  . aspirin 81 MG tablet Take 81 mg by mouth daily.  . Calcium Citrate-Vitamin D (CALCIUM CITRATE + D3 PO) Take 1 capsule by mouth.  . cholecalciferol (VITAMIN D) 1000 UNITS tablet Take 1,000 Units by mouth daily.    Marland Kitchen ibuprofen (ADVIL,MOTRIN) 200 MG tablet Take 200 mg by mouth every 6 (six) hours as  needed.  Marland Kitchen lisinopril-hydrochlorothiazide (PRINZIDE,ZESTORETIC) 20-12.5 MG tablet Take 0.5 tablets by mouth daily.  . simvastatin (ZOCOR) 20 MG tablet Take 0.5 tablets (10 mg total) by mouth daily.  . VENTOLIN HFA 108 (90 Base) MCG/ACT inhaler inhale 1 to 2 puffs by mouth every 4 hours if needed   No facility-administered encounter medications on file as of 05/27/2016.     Activities of Daily Living In your present state of health, do you have any difficulty performing the following activities: 05/27/2016  Hearing? N  Vision? N  Difficulty concentrating or making decisions? N  Walking or climbing stairs? N  Dressing or bathing? N  Doing errands, shopping? N  Preparing Food and eating ? N  Using the Toilet? N  In the past six months, have you accidently leaked urine? N  Do you have problems with loss of bowel control? N  Managing your Medications? N  Managing your Finances? N  Housekeeping or managing your Housekeeping? N  Some recent data might be hidden    Patient Care Team: Judy Pimple, MD as PCP - General Kassie Mends, DDS as Consulting Physician (Dentistry) Romero Liner  Serena CroissantL Isenstein, MD as Consulting Physician (Dermatology) Vedia PereyraPhillip T. Alvester MorinBell, MD as Consulting Physician (Ophthalmology)    Assessment:     Hearing Screening   125Hz  250Hz  500Hz  1000Hz  2000Hz  3000Hz  4000Hz  6000Hz  8000Hz   Right ear:   40 40 40  40    Left ear:   40 40 40  40    Vision Screening Comments: Last vision exam in June 2017 with Dr. Alvester MorinBell   Exercise Activities and Dietary recommendations Current Exercise Habits: The patient does not participate in regular exercise at present, Exercise limited by: None identified  Goals    . Increase physical activity          Starting 05/27/2016, I will attempt to exercise at least 30 min 5 days per week. Also, I will decrease intake of fried foods to one meal per week.       Fall Risk Fall Risk  05/27/2016 05/13/2016 04/04/2015 09/30/2013  Falls in the past year? No No No No    Depression Screen PHQ 2/9 Scores 05/27/2016 05/13/2016 04/04/2015 09/30/2013  PHQ - 2 Score 0 0 0 0     Cognitive Function MMSE - Mini Mental State Exam 05/27/2016  Orientation to time 5  Orientation to Place 5  Registration 3  Attention/ Calculation 0  Recall 3  Language- name 2 objects 0  Language- repeat 1  Language- follow 3 step command 3  Language- read & follow direction 0  Write a sentence 0  Copy design 0  Total score 20     PLEASE NOTE: A Mini-Cog screen was completed. Maximum score is 20. A value of 0 denotes this part of Folstein MMSE was not completed or the patient failed this part of the Mini-Cog screening.   Mini-Cog Screening Orientation to Time - Max 5 pts Orientation to Place - Max 5 pts Registration - Max 3 pts Recall - Max 3 pts Language Repeat - Max 1 pts Language Follow 3 Step Command - Max 3 pts     Immunization History  Administered Date(s) Administered  . Influenza,inj,Quad PF,36+ Mos 01/09/2014, 04/04/2015, 01/09/2016  . Pneumococcal Conjugate-13 04/04/2015  . Pneumococcal Polysaccharide-23 09/30/2013  . Td 10/06/2007  . Varicella Zoster Immune Globulin 09/24/2010  . Zoster 11/24/2010   Screening Tests Health Maintenance  Topic Date Due  . MAMMOGRAM  05/08/2017  . TETANUS/TDAP  10/05/2017  . COLONOSCOPY  12/13/2023  . INFLUENZA VACCINE  Completed  . DEXA SCAN  Completed  . ZOSTAVAX  Completed  . Hepatitis C Screening  Completed  . PNA vac Low Risk Adult  Completed      Plan:     I have personally reviewed and addressed the Medicare Annual Wellness questionnaire and have noted the following in the patient's chart:  A. Medical and social history B. Use of alcohol, tobacco or illicit drugs  C. Current medications and supplements D. Functional ability and status E.  Nutritional status F.  Physical activity G. Advance directives H. List of other physicians I.  Hospitalizations, surgeries, and ER visits in previous 12 months J.    Vitals K. Screenings to include hearing, vision, cognitive, depression L. Referrals and appointments - none  In addition, I have reviewed and discussed with patient certain preventive protocols, quality metrics, and best practice recommendations. A written personalized care plan for preventive services as well as general preventive health recommendations were provided to patient.  See attached scanned questionnaire for additional information.   Signed,   Randa EvensLesia Genowefa Morga, MHA, BS, LPN [facility]

## 2016-05-27 NOTE — Progress Notes (Signed)
Pre visit review using our clinic review tool, if applicable. No additional management support is needed unless otherwise documented below in the visit note. 

## 2016-05-27 NOTE — Progress Notes (Signed)
PCP notes:   Health maintenance:  No gaps identified.   Abnormal screenings:   None  Patient concerns:   None  Nurse concerns:  None  Next PCP appt:   N/A; CPE in Jan 2018  I reviewed health advisor's note, was available for consultation, and agree with documentation and plan. Roxy MannsMarne Tower MD

## 2016-06-04 LAB — COLOGUARD: Cologuard: NEGATIVE

## 2016-06-11 ENCOUNTER — Ambulatory Visit
Admission: RE | Admit: 2016-06-11 | Discharge: 2016-06-11 | Disposition: A | Discharge status: Home / Self Care | Payer: Medicare Other | Source: Ambulatory Visit | Attending: Family Medicine | Admitting: Family Medicine

## 2016-06-11 ENCOUNTER — Encounter: Payer: Self-pay | Admitting: *Deleted

## 2016-06-11 DIAGNOSIS — Z1231 Encounter for screening mammogram for malignant neoplasm of breast: Secondary | ICD-10-CM | POA: Diagnosis present

## 2017-01-09 ENCOUNTER — Other Ambulatory Visit: Payer: Self-pay | Admitting: Family Medicine

## 2017-01-09 NOTE — Telephone Encounter (Signed)
Pt called to ck on status of inhaler refill; advised already refilled to [facility] [address]. Pt voiced understanding.

## 2017-01-16 ENCOUNTER — Telehealth: Payer: Self-pay | Admitting: Family Medicine

## 2017-01-16 NOTE — Telephone Encounter (Signed)
Left pt message asking to call Revonda Standard back directly at [phone] to schedule AWV + labs with Virl Axe and CPE with PCP.  *NOTE* Last AWV 05/27/16; please scheduled 05/28/17 or after

## 2017-02-10 ENCOUNTER — Ambulatory Visit (INDEPENDENT_AMBULATORY_CARE_PROVIDER_SITE_OTHER): Payer: Medicare Other

## 2017-02-10 DIAGNOSIS — Z23 Encounter for immunization: Secondary | ICD-10-CM

## 2017-03-03 NOTE — Telephone Encounter (Signed)
Scheduled 05/28/17

## 2017-03-23 ENCOUNTER — Encounter: Payer: Self-pay | Admitting: Internal Medicine

## 2017-03-23 ENCOUNTER — Ambulatory Visit (INDEPENDENT_AMBULATORY_CARE_PROVIDER_SITE_OTHER): Payer: Medicare Other | Admitting: Internal Medicine

## 2017-03-23 VITALS — BP 118/82 | HR 95 | Temp 98.4°F | Resp 12 | Wt 145.0 lb

## 2017-03-23 DIAGNOSIS — J0141 Acute recurrent pansinusitis: Secondary | ICD-10-CM | POA: Diagnosis not present

## 2017-03-23 NOTE — Patient Instructions (Signed)
Let me know if you are getting worse as the week goes on, instead of feeling better.

## 2017-03-23 NOTE — Progress Notes (Signed)
Subjective:    Patient ID: Colleen BureauLinda W Heckendorn, female    DOB: 03/30/1946, 71 y.o.   MRN: [id number]  HPI Here due to respiratory illness  Started with facial pressure and scratchy throat about 4 days ago Now worsening drainage and cough--especially at night No fever No SOB Some ear pain on left Some blood from nose ~2 days ago Cough is dry  Using inhaler --thinks it helps the cough Using phenylephrine otherwise--this does help May be some better than at first  Current Outpatient Medications on File Prior to Visit  Medication Sig Dispense Refill  . aspirin 81 MG tablet Take 81 mg by mouth daily.    . Calcium Citrate-Vitamin D (CALCIUM CITRATE + D3 PO) Take 1 capsule by mouth.    . cholecalciferol (VITAMIN D) 1000 UNITS tablet Take 1,000 Units by mouth daily.      Marland Kitchen. ibuprofen (ADVIL,MOTRIN) 200 MG tablet Take 200 mg by mouth every 6 (six) hours as needed.    Marland Kitchen. lisinopril-hydrochlorothiazide (PRINZIDE,ZESTORETIC) 20-12.5 MG tablet Take 0.5 tablets by mouth daily. 15 tablet 11  . simvastatin (ZOCOR) 20 MG tablet Take 0.5 tablets (10 mg total) by mouth daily. 15 tablet 11  . VENTOLIN HFA 108 (90 Base) MCG/ACT inhaler inhale 1 to 2 puffs by mouth every 4 hours if needed 18 g 0   No current facility-administered medications on file prior to visit.     No Known Allergies  Past Medical History:  Diagnosis Date  . Allergy    allergic rhinitis  . Arthritis   . History of colon polyps - adenomas 12/17/2007  . Hyperlipidemia   . Hypertension   . Kidney stone   . Nephrolithiasis    Hx of  . Osteopenia     Past Surgical History:  Procedure Laterality Date  . ABDOMINAL HYSTERECTOMY  1980   total for bleeding  . CESAREAN SECTION  1969  . COLONOSCOPY    . DILATION AND CURETTAGE OF UTERUS     x2    Family History  Problem Relation Age of Onset  . Hypertension Mother   . Osteoporosis Mother   . Heart disease Father        CABG,CAD,MI  . Kidney failure Father   . Osteoporosis  Father        ? of OP pt had hip fx.  . Hypertension Brother   . Colon cancer Neg Hx   . Pancreatic cancer Neg Hx   . Rectal cancer Neg Hx   . Stomach cancer Neg Hx   . Breast cancer Neg Hx     Social History   Socioeconomic History  . Marital status: Married    Spouse name: Not on file  . Number of children: Not on file  . Years of education: Not on file  . Highest education level: Not on file  Social Needs  . Financial resource strain: Not on file  . Food insecurity - worry: Not on file  . Food insecurity - inability: Not on file  . Transportation needs - medical: Not on file  . Transportation needs - non-medical: Not on file  Occupational History  . Not on file  Tobacco Use  . Smoking status: Passive Smoke Exposure - Never Smoker  . Smokeless tobacco: Never Used  . Tobacco comment: husband smokes  Substance and Sexual Activity  . Alcohol use: No    Alcohol/week: 0.0 oz  . Drug use: No  . Sexual activity: No  Other Topics Concern  .  Not on file  Social History Narrative  . Not on file   Review of Systems Not an allergy  no rash No vomiting or diarrhea Appetite is fine     Objective:   Physical Exam  HENT:  Mild maxillary and frontal tenderness Moderate nasal inflammation Slight pharyngeal injection without exudate TMs normal  Neck: No thyromegaly present.  Pulmonary/Chest: Effort normal and breath sounds normal. No respiratory distress. She has no wheezes. She has no rales.  Lymphadenopathy:    She has no cervical adenopathy.          Assessment & Plan:

## 2017-03-23 NOTE — Assessment & Plan Note (Signed)
Still seems to be viral Discussed symptomatic treatment If worsens as the week goes by, would treat if worsens (amoxil)

## 2017-04-17 ENCOUNTER — Ambulatory Visit: Payer: Self-pay

## 2017-04-17 MED ORDER — AMOXICILLIN 500 MG PO TABS: 1000.0000 mg | ORAL_TABLET | Freq: Two times a day (BID) | ORAL | 0 refills | Status: AC

## 2017-04-17 NOTE — Telephone Encounter (Addendum)
Please let her know that I sent the antibiotic prescription  Reason for Disposition . [1] Sinus congestion (pressure, fullness) AND [2] present > 10 days  Answer Assessment - Initial Assessment Questions 1. LOCATION: "Where does it hurt?"      Sinus and face."Teeth hurt." 2. ONSET: "When did the sinus pain start?"  (e.g., hours, days)      Since 03/23/17 3. SEVERITY: "How bad is the pain?"   (Scale 1-10; mild, moderate or severe)   - MILD (1-3): doesn't interfere with normal activities    - MODERATE (4-7): interferes with normal activities (e.g., work or school) or awakens from sleep   - SEVERE (8-10): excruciating pain and patient unable to do any normal activities        5 4. RECURRENT SYMPTOM: "Have you ever had sinus problems before?" If so, ask: "When was the last time?" and "What happened that time?"      Yes 5. NASAL CONGESTION: "Is the nose blocked?" If so, ask, "Can you open it or must you breathe through the mouth?"     No 6. NASAL DISCHARGE: "Do you have discharge from your nose?" If so ask, "What color?"     Yellow 7. FEVER: "Do you have a fever?" If so, ask: "What is it, how was it measured, and when did it start?"      99 8. OTHER SYMPTOMS: "Do you have any other symptoms?" (e.g., sore throat, cough, earache, difficulty breathing)     Ears are itchy, sore throat 9. PREGNANCY: "Is there any chance you are pregnant?" "When was your last menstrual period?"     No  Protocols used: SINUS PAIN OR CONGESTION-A-AH  Pt. Also has non-productive cough. Request antibiotic be called in - States she " thought Dr. Alphonsus SiasLetvak said he would if I wasn't better."

## 2017-04-17 NOTE — Telephone Encounter (Signed)
Left message to call office. Dr Alphonsus SiasLetvak has called in amoxicillin.

## 2017-04-17 NOTE — Addendum Note (Signed)
Addended by: Tillman AbideLETVAK, Waylon Koffler I on: 04/17/2017 10:28 AM   Modules accepted: Orders

## 2017-05-15 ENCOUNTER — Other Ambulatory Visit: Payer: Self-pay | Admitting: Family Medicine

## 2017-05-15 NOTE — Telephone Encounter (Signed)
Patient called and said she will be out before the weekend.

## 2017-05-15 NOTE — Telephone Encounter (Signed)
Med filled.  

## 2017-05-24 ENCOUNTER — Telehealth: Payer: Self-pay | Admitting: Family Medicine

## 2017-05-24 DIAGNOSIS — E559 Vitamin D deficiency, unspecified: Secondary | ICD-10-CM

## 2017-05-24 DIAGNOSIS — E7800 Pure hypercholesterolemia, unspecified: Secondary | ICD-10-CM

## 2017-05-24 DIAGNOSIS — I10 Essential (primary) hypertension: Secondary | ICD-10-CM

## 2017-05-24 NOTE — Telephone Encounter (Signed)
-----   Message from Robert Bellowarlesia R Pinson, LPN sent at 7/82/95621/29/2019  4:24 PM EST ----- Regarding: Labs 2/6 Lab orders needed. Thank you.  Insurance:  [facility] [employer]

## 2017-05-28 ENCOUNTER — Ambulatory Visit (INDEPENDENT_AMBULATORY_CARE_PROVIDER_SITE_OTHER): Payer: Medicare Other

## 2017-05-28 VITALS — BP 116/84 | HR 88 | Temp 98.4°F | Ht 65.0 in | Wt 146.8 lb

## 2017-05-28 DIAGNOSIS — I10 Essential (primary) hypertension: Secondary | ICD-10-CM | POA: Diagnosis not present

## 2017-05-28 DIAGNOSIS — E7800 Pure hypercholesterolemia, unspecified: Secondary | ICD-10-CM

## 2017-05-28 DIAGNOSIS — E559 Vitamin D deficiency, unspecified: Secondary | ICD-10-CM | POA: Diagnosis not present

## 2017-05-28 DIAGNOSIS — Z0000 Encounter for general adult medical examination without abnormal findings: Secondary | ICD-10-CM

## 2017-05-28 LAB — CBC WITH DIFFERENTIAL/PLATELET
BASOS ABS: 0 10*3/uL (ref 0.0–0.1)
Basophils Relative: 1 % (ref 0.0–3.0)
EOS PCT: 2.3 % (ref 0.0–5.0)
Eosinophils Absolute: 0.1 10*3/uL (ref 0.0–0.7)
HCT: 39.1 % (ref 36.0–46.0)
HEMOGLOBIN: 13.2 g/dL (ref 12.0–15.0)
LYMPHS PCT: 26 % (ref 12.0–46.0)
Lymphs Abs: 1.1 10*3/uL (ref 0.7–4.0)
MCHC: 33.7 g/dL (ref 30.0–36.0)
MCV: 90.3 fl (ref 78.0–100.0)
MONOS PCT: 8 % (ref 3.0–12.0)
Monocytes Absolute: 0.4 10*3/uL (ref 0.1–1.0)
Neutro Abs: 2.8 10*3/uL (ref 1.4–7.7)
Neutrophils Relative %: 62.7 % (ref 43.0–77.0)
Platelets: 346 10*3/uL (ref 150.0–400.0)
RBC: 4.33 Mil/uL (ref 3.87–5.11)
RDW: 13.3 % (ref 11.5–15.5)
WBC: 4.4 10*3/uL (ref 4.0–10.5)

## 2017-05-28 LAB — COMPREHENSIVE METABOLIC PANEL
ALBUMIN: 4.3 g/dL (ref 3.5–5.2)
ALK PHOS: 51 U/L (ref 39–117)
ALT: 18 U/L (ref 0–35)
AST: 19 U/L (ref 0–37)
BILIRUBIN TOTAL: 0.5 mg/dL (ref 0.2–1.2)
BUN: 16 mg/dL (ref 6–23)
CO2: 29 mEq/L (ref 19–32)
Calcium: 9.6 mg/dL (ref 8.4–10.5)
Chloride: 103 mEq/L (ref 96–112)
Creatinine, Ser: 0.63 mg/dL (ref 0.40–1.20)
GFR: 99.17 mL/min (ref >60.00)
Glucose, Bld: 87 mg/dL (ref 70–99)
POTASSIUM: 3.6 meq/L (ref 3.5–5.1)
Sodium: 139 mEq/L (ref 135–145)
TOTAL PROTEIN: 7.6 g/dL (ref 6.0–8.3)

## 2017-05-28 LAB — LIPID PANEL
Cholesterol: 170 mg/dL (ref 0–200)
HDL: 47.2 mg/dL (ref >39.00)
LDL Cholesterol: 102 mg/dL — ABNORMAL HIGH (ref 0–99)
NONHDL: 123.29
Total CHOL/HDL Ratio: 4
Triglycerides: 104 mg/dL (ref 0.0–149.0)
VLDL: 20.8 mg/dL (ref 0.0–40.0)

## 2017-05-28 LAB — TSH: TSH: 0.75 u[IU]/mL (ref 0.35–4.50)

## 2017-05-28 LAB — VITAMIN D 25 HYDROXY (VIT D DEFICIENCY, FRACTURES): VITD: 41.43 ng/mL (ref 30.00–100.00)

## 2017-05-28 NOTE — Patient Instructions (Addendum)
[name] , Thank you for taking time to come for your Medicare Wellness Visit. I appreciate your ongoing commitment to your health goals. Please review the following plan we discussed and let me know if I can assist you in the future.   These are the goals we discussed: Goals    . Follow up with Primary Care Provider     Starting 05/28/2017, I will continue to take medications as prescribed and to keep appointments with PCP as scheduled.        This is a list of the screening recommended for you and due dates:  Health Maintenance  Topic Date Due  . Tetanus Vaccine  10/05/2017  . Mammogram  06/11/2018  . Cologuard (Stool DNA test)  06/11/2019  . Flu Shot  Completed  . DEXA scan (bone density measurement)  Completed  .  Hepatitis C: One time screening is recommended by Center for Disease Control  (CDC) for  adults born from [birth year] through 1965.   Completed  . Pneumonia vaccines  Completed   Preventive Care for Adults  A healthy lifestyle and preventive care can promote health and wellness. Preventive health guidelines for adults include the following key practices.  . A routine yearly physical is a good way to check with your health care provider about your health and preventive screening. It is a chance to share any concerns and updates on your health and to receive a thorough exam.  . Visit your dentist for a routine exam and preventive care every 6 months. Brush your teeth twice a day and floss once a day. Good oral hygiene prevents tooth decay and gum disease.  . The frequency of eye exams is based on your age, health, family medical history, use  of contact lenses, and other factors. Follow your health care provider's recommendations for frequency of eye exams.  . Eat a healthy diet. Foods like vegetables, fruits, whole grains, low-fat dairy products, and lean protein foods contain the nutrients you need without too many calories. Decrease your intake of foods high in solid fats,  added sugars, and salt. Eat the right amount of calories for you. Get information about a proper diet from your health care provider, if necessary.  . Regular physical exercise is one of the most important things you can do for your health. Most adults should get at least 150 minutes of moderate-intensity exercise (any activity that increases your heart rate and causes you to sweat) each week. In addition, most adults need muscle-strengthening exercises on 2 or more days a week.  Silver Sneakers may be a benefit available to you. To determine eligibility, you may visit the website: www.silversneakers.com or contact program at [phone] Mon-Fri between 8AM-8PM.   . Maintain a healthy weight. The body mass index (BMI) is a screening tool to identify possible weight problems. It provides an estimate of body fat based on height and weight. Your health care provider can find your BMI and can help you achieve or maintain a healthy weight.   For adults 20 years and older: ? A BMI below 18.5 is considered underweight. ? A BMI of 18.5 to 24.9 is normal. ? A BMI of 25 to 29.9 is considered overweight. ? A BMI of 30 and above is considered obese.   . Maintain normal blood lipids and cholesterol levels by exercising and minimizing your intake of saturated fat. Eat a balanced diet with plenty of fruit and vegetables. Blood tests for lipids and cholesterol should begin at  age 50 and be repeated every 5 years. If your lipid or cholesterol levels are high, you are over 50, or you are at high risk for heart disease, you may need your cholesterol levels checked more frequently. Ongoing high lipid and cholesterol levels should be treated with medicines if diet and exercise are not working.  . If you smoke, find out from your health care provider how to quit. If you do not use tobacco, please do not start.  . If you choose to drink alcohol, please do not consume more than 2 drinks per day. One drink is  considered to be 12 ounces (355 mL) of beer, 5 ounces (148 mL) of wine, or 1.5 ounces (44 mL) of liquor.  . If you are 26-40 years old, ask your health care provider if you should take aspirin to prevent strokes.  . Use sunscreen. Apply sunscreen liberally and repeatedly throughout the day. You should seek shade when your shadow is shorter than you. Protect yourself by wearing long sleeves, pants, a wide-brimmed hat, and sunglasses year round, whenever you are outdoors.  . Once a month, do a whole body skin exam, using a mirror to look at the skin on your back. Tell your health care provider of new moles, moles that have irregular borders, moles that are larger than a pencil eraser, or moles that have changed in shape or color.

## 2017-05-28 NOTE — Progress Notes (Signed)
Subjective:   Colleen Cochran is a 72 y.o. female who presents for Medicare Annual (Subsequent) preventive examination.  Review of Systems:  N/A Cardiac Risk Factors include: advanced age (>93men, >73 women);dyslipidemia;hypertension;smoking/ tobacco exposure     Objective:     Vitals: BP 116/84 (BP Location: Right Arm, Patient Position: Sitting, Cuff Size: Normal)   Pulse 88   Temp 98.4 F (36.9 C) (Oral)   Ht 5\' 5"  (1.651 m) Comment: no shoes  Wt 146 lb 12 oz (66.6 kg)   SpO2 98%   BMI 24.42 kg/m   Body mass index is 24.42 kg/m.  Advanced Directives 05/28/2017 05/27/2016 12/12/2013 11/28/2013  Does Patient Have a Medical Advance Directive? No No No Patient does not have advance directive  Would patient like information on creating a medical advance directive? Yes (MAU/Ambulatory/Procedural Areas - Information given) - - -  Pre-existing out of facility DNR order (yellow form or pink MOST form) - - - No    Tobacco Social History   Tobacco Use  Smoking Status Passive Smoke Exposure - Never Smoker  Smokeless Tobacco Never Used  Tobacco Comment   husband smokes     Counseling given: No Comment: husband smokes   Clinical Intake:  Pre-visit preparation completed: Yes  Pain : No/denies pain Pain Score: 0-No pain     Nutritional Status: BMI of 19-24  Normal Nutritional Risks: None Diabetes: No  How often do you need to have someone help you when you read instructions, pamphlets, or other written materials from your doctor or pharmacy?: 1 - Never What is the last grade level you completed in school?: Associate degree  Interpreter Needed?: No  Comments: pt lives with spouse Information entered by :: LPinson, LPN  Past Medical History:  Diagnosis Date  . Allergy    allergic rhinitis  . Arthritis   . History of colon polyps - adenomas 12/17/2007  . Hyperlipidemia   . Hypertension   . Kidney stone   . Nephrolithiasis    Hx of  . Osteopenia    Past Surgical  History:  Procedure Laterality Date  . ABDOMINAL HYSTERECTOMY  1980   total for bleeding  . CESAREAN SECTION  1969  . COLONOSCOPY    . DILATION AND CURETTAGE OF UTERUS     x2   Family History  Problem Relation Age of Onset  . Hypertension Mother   . Osteoporosis Mother   . Heart disease Father        CABG,CAD,MI  . Kidney failure Father   . Osteoporosis Father        ? of OP pt had hip fx.  . Hypertension Brother   . Colon cancer Neg Hx   . Pancreatic cancer Neg Hx   . Rectal cancer Neg Hx   . Stomach cancer Neg Hx   . Breast cancer Neg Hx    Social History   Socioeconomic History  . Marital status: Married    Spouse name: None  . Number of children: None  . Years of education: None  . Highest education level: None  Social Needs  . Financial resource strain: None  . Food insecurity - worry: None  . Food insecurity - inability: None  . Transportation needs - medical: None  . Transportation needs - non-medical: None  Occupational History  . None  Tobacco Use  . Smoking status: Passive Smoke Exposure - Never Smoker  . Smokeless tobacco: Never Used  . Tobacco comment: husband smokes  Substance and  Sexual Activity  . Alcohol use: No    Alcohol/week: 0.0 oz  . Drug use: No  . Sexual activity: No  Other Topics Concern  . None  Social History Narrative  . None    Outpatient Encounter Medications as of 05/28/2017  Medication Sig  . aspirin 81 MG tablet Take 81 mg by mouth daily.  . Calcium Citrate-Vitamin D (CALCIUM CITRATE + D3 PO) Take 1 capsule by mouth.  . cholecalciferol (VITAMIN D) 1000 UNITS tablet Take 1,000 Units by mouth daily.    Marland Kitchen ibuprofen (ADVIL,MOTRIN) 200 MG tablet Take 200 mg by mouth every 6 (six) hours as needed.  Marland Kitchen lisinopril-hydrochlorothiazide (PRINZIDE,ZESTORETIC) 20-12.5 MG tablet take 1/2 tablet by mouth daily  . simvastatin (ZOCOR) 10 MG tablet take 1 tablet by mouth daily  . VENTOLIN HFA 108 (90 Base) MCG/ACT inhaler inhale 1 to 2 puffs  by mouth every 4 hours if needed   No facility-administered encounter medications on file as of 05/28/2017.     Activities of Daily Living In your present state of health, do you have any difficulty performing the following activities: 05/28/2017  Hearing? N  Vision? N  Difficulty concentrating or making decisions? N  Walking or climbing stairs? N  Dressing or bathing? N  Doing errands, shopping? N  Preparing Food and eating ? N  Using the Toilet? N  In the past six months, have you accidently leaked urine? N  Do you have problems with loss of bowel control? N  Managing your Medications? N  Managing your Finances? N  Housekeeping or managing your Housekeeping? N  Some recent data might be hidden    Patient Care Team: Tower, Audrie Gallus, MD as PCP - General Kassie Mends, DDS as Consulting Physician (Dentistry) Debbrah Alar, MD as Consulting Physician (Dermatology) Irene Limbo., MD as Consulting Physician (Ophthalmology)    Assessment:   This is a routine wellness examination for [place].   Hearing Screening   125Hz  250Hz  500Hz  1000Hz  2000Hz  3000Hz  4000Hz  6000Hz  8000Hz   Right ear:   40 40 40  40    Left ear:   40 40 40  40      Visual Acuity Screening   Right eye Left eye Both eyes  Without correction:     With correction: 20/20 20/25 20/20    Exercise Activities and Dietary recommendations Current Exercise Habits: The patient does not participate in regular exercise at present, Exercise limited by: None identified  Goals    . Follow up with Primary Care Provider     Starting 05/28/2017, I will continue to take medications as prescribed and to keep appointments with PCP as scheduled.        Fall Risk Fall Risk  05/28/2017 05/27/2016 05/13/2016 04/04/2015 09/30/2013  Falls in the past year? No No No No No   Depression Screen PHQ 2/9 Scores 05/28/2017 05/27/2016 05/13/2016 04/04/2015  PHQ - 2 Score 0 0 0 0  PHQ- 9 Score 0 - - -     Cognitive Function MMSE - Mini Mental  State Exam 05/28/2017 05/27/2016  Orientation to time 5 5  Orientation to Place 5 5  Registration 3 3  Attention/ Calculation 0 0  Recall 3 3  Language- name 2 objects 0 0  Language- repeat 1 1  Language- follow 3 step command 3 3  Language- read & follow direction 0 0  Write a sentence 0 0  Copy design 0 0  Total score 20 20  PLEASE NOTE: A Mini-Cog screen was completed. Maximum score is 20. A value of 0 denotes this part of Folstein MMSE was not completed or the patient failed this part of the Mini-Cog screening.   Mini-Cog Screening Orientation to Time - Max 5 pts Orientation to Place - Max 5 pts Registration - Max 3 pts Recall - Max 3 pts Language Repeat - Max 1 pts Language Follow 3 Step Command - Max 3 pts     Immunization History  Administered Date(s) Administered  . Influenza,inj,Quad PF,6+ Mos 01/09/2014, 04/04/2015, 01/09/2016, 02/10/2017  . Pneumococcal Conjugate-13 04/04/2015  . Pneumococcal Polysaccharide-23 09/30/2013  . Td 10/06/2007  . Varicella Zoster Immune Globulin 09/24/2010  . Zoster 11/24/2010    Screening Tests Health Maintenance  Topic Date Due  . TETANUS/TDAP  10/05/2017  . MAMMOGRAM  06/11/2018  . Fecal DNA (Cologuard)  06/11/2019  . INFLUENZA VACCINE  Completed  . DEXA SCAN  Completed  . Hepatitis C Screening  Completed  . PNA vac Low Risk Adult  Completed       Plan:     I have personally reviewed, addressed, and noted the following in the patient's chart:  A. Medical and social history B. Use of alcohol, tobacco or illicit drugs  C. Current medications and supplements D. Functional ability and status E.  Nutritional status F.  Physical activity G. Advance directives H. List of other physicians I.  Hospitalizations, surgeries, and ER visits in previous 12 months J.  Vitals K. Screenings to include hearing, vision, cognitive, depression L. Referrals and appointments - none  In addition, I have reviewed and discussed with  patient certain preventive protocols, quality metrics, and best practice recommendations. A written personalized care plan for preventive services as well as general preventive health recommendations were provided to patient.  See attached scanned questionnaire for additional information.   Signed,   Randa EvensLesia Sigismund Cross, MHA, BS, LPN Health Coach

## 2017-05-28 NOTE — Progress Notes (Signed)
Pre visit review using our clinic review tool, if applicable. No additional management support is needed unless otherwise documented below in the visit note. 

## 2017-05-28 NOTE — Progress Notes (Signed)
PCP notes:   Health maintenance:  No gaps identified.   Abnormal screenings:   None  Patient concerns:   None  Nurse concerns:  None  Next PCP appt:   06/03/17 @ 1030

## 2017-05-31 NOTE — Progress Notes (Signed)
I reviewed health advisor's note, was available for consultation, and agree with documentation and plan.  

## 2017-06-03 ENCOUNTER — Ambulatory Visit (INDEPENDENT_AMBULATORY_CARE_PROVIDER_SITE_OTHER): Payer: Medicare Other | Admitting: Family Medicine

## 2017-06-03 ENCOUNTER — Encounter: Payer: Self-pay | Admitting: Family Medicine

## 2017-06-03 VITALS — BP 118/70 | HR 79 | Temp 97.8°F | Ht 65.0 in | Wt 150.0 lb

## 2017-06-03 DIAGNOSIS — E7800 Pure hypercholesterolemia, unspecified: Secondary | ICD-10-CM | POA: Diagnosis not present

## 2017-06-03 DIAGNOSIS — E559 Vitamin D deficiency, unspecified: Secondary | ICD-10-CM | POA: Diagnosis not present

## 2017-06-03 DIAGNOSIS — M8580 Other specified disorders of bone density and structure, unspecified site: Secondary | ICD-10-CM | POA: Diagnosis not present

## 2017-06-03 DIAGNOSIS — E2839 Other primary ovarian failure: Secondary | ICD-10-CM | POA: Diagnosis not present

## 2017-06-03 DIAGNOSIS — Z1231 Encounter for screening mammogram for malignant neoplasm of breast: Secondary | ICD-10-CM

## 2017-06-03 DIAGNOSIS — I10 Essential (primary) hypertension: Secondary | ICD-10-CM

## 2017-06-03 DIAGNOSIS — Z0000 Encounter for general adult medical examination without abnormal findings: Secondary | ICD-10-CM | POA: Diagnosis not present

## 2017-06-03 MED ORDER — LISINOPRIL-HYDROCHLOROTHIAZIDE 20-12.5 MG PO TABS: 0.5000 | ORAL_TABLET | Freq: Every day | ORAL | 11 refills | Status: DC

## 2017-06-03 MED ORDER — SIMVASTATIN 10 MG PO TABS: 10.0000 mg | ORAL_TABLET | Freq: Every day | ORAL | 11 refills | Status: DC

## 2017-06-03 NOTE — Progress Notes (Signed)
Subjective:    Patient ID: Colleen Cochran, female    DOB: 05-14-1945, 72 y.o.   MRN: [id number]  HPI Here for health maintenance exam and to review chronic medical problems    Wt Readings from Last 3 Encounters:  06/03/17 150 lb (68 kg)  05/28/17 146 lb 12 oz (66.6 kg)  03/23/17 145 lb (65.8 kg)  wintertime / less active  Eating about the same  Walks to the mailbox  24.96 kg/m   Had amw on 2/19  No concerns   Tetanus shot 6/09  Mammogram 2/18- due for it this month  Self breast exam -no lumps or changes   cologuard 2/18 negative   dexa 1/17- osteopenia mixed trend  Will schedule 2 y f/u  No falls or fractures  D level is 63- taking her vit D / does not take ca (intolerant)   Zoster vaccine 8/12   bp is stable today (sometimes gets low at home)  No cp or palpitations or headaches or edema  No side effects to medicines  BP Readings from Last 3 Encounters:  06/03/17 118/70  05/28/17 116/84  03/23/17 118/82    Lab Results  Component Value Date   CREATININE 0.63 05/28/2017   BUN 16 05/28/2017   NA 139 05/28/2017   K 3.6 05/28/2017   CL 103 05/28/2017   CO2 29 05/28/2017   Lab Results  Component Value Date   ALT 18 05/28/2017   AST 19 05/28/2017   ALKPHOS 51 05/28/2017   BILITOT 0.5 05/28/2017   Lab Results  Component Value Date   WBC 4.4 05/28/2017   HGB 13.2 05/28/2017   HCT 39.1 05/28/2017   MCV 90.3 05/28/2017   PLT 346.0 05/28/2017   Lab Results  Component Value Date   TSH 0.75 05/28/2017       Hyperlipidemia Lab Results  Component Value Date   CHOL 170 05/28/2017   CHOL 163 05/13/2016   CHOL 154 03/30/2015   Lab Results  Component Value Date   HDL 47.20 05/28/2017   HDL 46.50 05/13/2016   HDL 44.30 03/30/2015   Lab Results  Component Value Date   LDLCALC 102 (H) 05/28/2017   LDLCALC 93 05/13/2016   LDLCALC 88 03/30/2015   Lab Results  Component Value Date   TRIG 104.0 05/28/2017   TRIG 118.0 05/13/2016   TRIG 108.0  03/30/2015   Lab Results  Component Value Date   CHOLHDL 4 05/28/2017   CHOLHDL 4 05/13/2016   CHOLHDL 3 03/30/2015   Lab Results  Component Value Date   LDLDIRECT 179.6 03/02/2009   zocor 20 mg  Tries to watch diet for sat fats  Fried foods occ 3-5 times per week   Patient Active Problem List   Diagnosis Date Noted  . Screening mammogram, encounter for 06/03/2017  . Need for hepatitis C screening test 05/03/2016  . Second hand smoke exposure 01/09/2016  . Estrogen deficiency 04/04/2015  . Encounter for Medicare annual wellness exam 09/30/2013  . Family history of aortic aneurysm 09/30/2013  . Other screening mammogram 11/24/2011  . Routine general medical examination at a health care facility 11/17/2011  . Hypertension 09/24/2010  . POSTMENOPAUSAL STATUS 06/24/2010  . Vitamin D deficiency 03/02/2009  . History of colon polyps - adenomas 12/17/2007  . Hyperlipidemia 10/05/2007  . SINUSITIS, RECURRENT 10/05/2007  . ALLERGIC RHINITIS 10/05/2007  . Disorder of bone and cartilage 10/05/2007  . NEPHROLITHIASIS, HX OF 10/05/2007   Past Medical History:  Diagnosis Date  .  Allergy    allergic rhinitis  . Arthritis   . History of colon polyps - adenomas 12/17/2007  . Hyperlipidemia   . Hypertension   . Kidney stone   . Nephrolithiasis    Hx of  . Osteopenia    Past Surgical History:  Procedure Laterality Date  . ABDOMINAL HYSTERECTOMY  1980   total for bleeding  . CESAREAN SECTION  1969  . COLONOSCOPY    . DILATION AND CURETTAGE OF UTERUS     x2   Social History   Tobacco Use  . Smoking status: Passive Smoke Exposure - Never Smoker  . Smokeless tobacco: Never Used  . Tobacco comment: husband smokes  Substance Use Topics  . Alcohol use: No    Alcohol/week: 0.0 oz  . Drug use: No   Family History  Problem Relation Age of Onset  . Hypertension Mother   . Osteoporosis Mother   . Heart disease Father        CABG,CAD,MI  . Kidney failure Father   .  Osteoporosis Father        ? of OP pt had hip fx.  . Hypertension Brother   . Colon cancer Neg Hx   . Pancreatic cancer Neg Hx   . Rectal cancer Neg Hx   . Stomach cancer Neg Hx   . Breast cancer Neg Hx    No Known Allergies Current Outpatient Medications on File Prior to Visit  Medication Sig Dispense Refill  . aspirin 81 MG tablet Take 81 mg by mouth daily.    . Calcium Citrate-Vitamin D (CALCIUM CITRATE + D3 PO) Take 1 capsule by mouth.    . cholecalciferol (VITAMIN D) 1000 UNITS tablet Take 1,000 Units by mouth daily.      Marland Kitchen. ibuprofen (ADVIL,MOTRIN) 200 MG tablet Take 200 mg by mouth every 6 (six) hours as needed.    . VENTOLIN HFA 108 (90 Base) MCG/ACT inhaler inhale 1 to 2 puffs by mouth every 4 hours if needed 18 g 0   No current facility-administered medications on file prior to visit.     Review of Systems  Constitutional: Negative for activity change, appetite change, fatigue, fever and unexpected weight change.  HENT: Negative for congestion, ear pain, rhinorrhea, sinus pressure and sore throat.   Eyes: Negative for pain, redness and visual disturbance.  Respiratory: Negative for cough, shortness of breath and wheezing.   Cardiovascular: Negative for chest pain and palpitations.  Gastrointestinal: Negative for abdominal pain, blood in stool, constipation and diarrhea.  Endocrine: Negative for polydipsia and polyuria.  Genitourinary: Negative for dysuria, frequency and urgency.  Musculoskeletal: Negative for arthralgias, back pain and myalgias.  Skin: Negative for pallor and rash.  Allergic/Immunologic: Negative for environmental allergies.  Neurological: Negative for dizziness, syncope and headaches.  Hematological: Negative for adenopathy. Does not bruise/bleed easily.  Psychiatric/Behavioral: Negative for decreased concentration and dysphoric mood. The patient is not nervous/anxious.        Objective:   Physical Exam  Constitutional: She appears well-developed and  well-nourished. No distress.  Well appearing   HENT:  Head: Normocephalic and atraumatic.  Right Ear: External ear normal.  Left Ear: External ear normal.  Mouth/Throat: Oropharynx is clear and moist.  Eyes: Conjunctivae and EOM are normal. Pupils are equal, round, and reactive to light. No scleral icterus.  Neck: Normal range of motion. Neck supple. No JVD present. Carotid bruit is not present. No thyromegaly present.  Cardiovascular: Normal rate, regular rhythm, normal heart sounds and intact  distal pulses. Exam reveals no gallop.  Pulmonary/Chest: Effort normal and breath sounds normal. No respiratory distress. She has no wheezes. She exhibits no tenderness.  Abdominal: Soft. Bowel sounds are normal. She exhibits no distension, no abdominal bruit and no mass. There is no tenderness.  Genitourinary: No breast swelling, tenderness, discharge or bleeding.  Genitourinary Comments: Breast exam: No mass, nodules, thickening, tenderness, bulging, retraction, inflamation, nipple discharge or skin changes noted.  No axillary or clavicular LA.      Musculoskeletal: Normal range of motion. She exhibits no edema or tenderness.  No kyphosis   Lymphadenopathy:    She has no cervical adenopathy.  Neurological: She is alert. She has normal reflexes. No cranial nerve deficit. She exhibits normal muscle tone. Coordination normal.  Skin: Skin is warm and dry. No rash noted. No erythema. No pallor.  Solar lentigines diffusely  Some sks  Psychiatric: She has a normal mood and affect.          Assessment & Plan:   Problem List Items Addressed This Visit      Cardiovascular and Mediastinum   Hypertension    bp in fair control at this time  BP Readings from Last 1 Encounters:  06/03/17 118/70   No changes needed Disc lifstyle change with low sodium diet and exercise  Labs rev      Relevant Medications   lisinopril-hydrochlorothiazide (PRINZIDE,ZESTORETIC) 20-12.5 MG tablet   simvastatin  (ZOCOR) 10 MG tablet     Musculoskeletal and Integument   Osteopenia    Disc need for calcium/ vitamin D/ wt bearing exercise and bone density test every 2 y to monitor Disc safety/ fracture risk in detail   Scheduled 2 y f/u dexa        Other   Estrogen deficiency   Relevant Orders   DG Bone Density   Hyperlipidemia    Disc goals for lipids and reasons to control them Rev labs with pt Rev low sat fat diet in detail Will try to eat less fatty food and exercise more       Relevant Medications   lisinopril-hydrochlorothiazide (PRINZIDE,ZESTORETIC) 20-12.5 MG tablet   simvastatin (ZOCOR) 10 MG tablet   Routine general medical examination at a health care facility - Primary    Reviewed health habits including diet and exercise and skin cancer prevention Reviewed appropriate screening tests for age  Also reviewed health mt list, fam hx and immunization status , as well as social and family history   Labs reviewed  amw reviewed  Ordered mammogram and dexa        Screening mammogram, encounter for    Scheduled annual screening mammogram Nl breast exam today  Encouraged monthly self exams        Relevant Orders   MM DIGITAL SCREENING BILATERAL   Vitamin D deficiency    Vitamin D level is therapeutic with current supplementation Disc importance of this to bone and overall health Level in the 40s

## 2017-06-03 NOTE — Patient Instructions (Addendum)
Start thinking about options for indoor exercise - for instance mall walking or videos or a gym or home exercise equip  Fitness will make you feel better   We will refer you for mammogram and bone density test   For cholesterol  Red meat once per month  Fried foods once per months - not good for you in general

## 2017-06-04 NOTE — Assessment & Plan Note (Signed)
bp in fair control at this time  BP Readings from Last 1 Encounters:  06/03/17 118/70   No changes needed Disc lifstyle change with low sodium diet and exercise  Labs rev

## 2017-06-04 NOTE — Assessment & Plan Note (Signed)
Disc goals for lipids and reasons to control them Rev labs with pt Rev low sat fat diet in detail Will try to eat less fatty food and exercise more

## 2017-06-04 NOTE — Assessment & Plan Note (Signed)
Disc need for calcium/ vitamin D/ wt bearing exercise and bone density test every 2 y to monitor Disc safety/ fracture risk in detail   Scheduled 2 y f/u dexa

## 2017-06-04 NOTE — Assessment & Plan Note (Signed)
Vitamin D level is therapeutic with current supplementation Disc importance of this to bone and overall health Level in the 40s 

## 2017-06-04 NOTE — Assessment & Plan Note (Signed)
Reviewed health habits including diet and exercise and skin cancer prevention Reviewed appropriate screening tests for age  Also reviewed health mt list, fam hx and immunization status , as well as social and family history   Labs reviewed  amw reviewed  Ordered mammogram and dexa

## 2017-06-04 NOTE — Assessment & Plan Note (Signed)
Scheduled annual screening mammogram Nl breast exam today  Encouraged monthly self exams   

## 2017-06-25 ENCOUNTER — Ambulatory Visit
Admission: RE | Admit: 2017-06-25 | Discharge: 2017-06-25 | Disposition: A | Discharge status: Home / Self Care | Payer: Medicare Other | Source: Ambulatory Visit | Attending: Family Medicine | Admitting: Family Medicine

## 2017-06-25 DIAGNOSIS — E2839 Other primary ovarian failure: Secondary | ICD-10-CM | POA: Insufficient documentation

## 2017-06-25 DIAGNOSIS — Z1231 Encounter for screening mammogram for malignant neoplasm of breast: Secondary | ICD-10-CM

## 2017-06-25 DIAGNOSIS — M8589 Other specified disorders of bone density and structure, multiple sites: Secondary | ICD-10-CM | POA: Insufficient documentation

## 2018-03-11 ENCOUNTER — Ambulatory Visit (INDEPENDENT_AMBULATORY_CARE_PROVIDER_SITE_OTHER): Payer: Medicare Other

## 2018-03-11 DIAGNOSIS — Z23 Encounter for immunization: Secondary | ICD-10-CM | POA: Diagnosis not present

## 2018-04-29 ENCOUNTER — Other Ambulatory Visit: Payer: Self-pay | Admitting: Family Medicine

## 2018-06-08 ENCOUNTER — Ambulatory Visit: Payer: Medicare Other

## 2018-06-08 ENCOUNTER — Ambulatory Visit (INDEPENDENT_AMBULATORY_CARE_PROVIDER_SITE_OTHER): Payer: Medicare Other

## 2018-06-08 VITALS — BP 120/82 | HR 98 | Temp 97.7°F | Ht 65.0 in | Wt 141.2 lb

## 2018-06-08 DIAGNOSIS — E782 Mixed hyperlipidemia: Secondary | ICD-10-CM

## 2018-06-08 DIAGNOSIS — E559 Vitamin D deficiency, unspecified: Secondary | ICD-10-CM | POA: Diagnosis not present

## 2018-06-08 DIAGNOSIS — I10 Essential (primary) hypertension: Secondary | ICD-10-CM | POA: Diagnosis not present

## 2018-06-08 DIAGNOSIS — Z0000 Encounter for general adult medical examination without abnormal findings: Secondary | ICD-10-CM

## 2018-06-08 LAB — CBC WITH DIFFERENTIAL/PLATELET
BASOS ABS: 0 10*3/uL (ref 0.0–0.1)
Basophils Relative: 0.8 % (ref 0.0–3.0)
Eosinophils Absolute: 0.1 10*3/uL (ref 0.0–0.7)
Eosinophils Relative: 1.9 % (ref 0.0–5.0)
HEMATOCRIT: 39.2 % (ref 36.0–46.0)
HEMOGLOBIN: 13 g/dL (ref 12.0–15.0)
LYMPHS PCT: 26.3 % (ref 12.0–46.0)
Lymphs Abs: 1.4 10*3/uL (ref 0.7–4.0)
MCHC: 33.2 g/dL (ref 30.0–36.0)
MCV: 89.4 fl (ref 78.0–100.0)
MONOS PCT: 9.7 % (ref 3.0–12.0)
Monocytes Absolute: 0.5 10*3/uL (ref 0.1–1.0)
Neutro Abs: 3.2 10*3/uL (ref 1.4–7.7)
Neutrophils Relative %: 61.3 % (ref 43.0–77.0)
Platelets: 328 10*3/uL (ref 150.0–400.0)
RBC: 4.39 Mil/uL (ref 3.87–5.11)
RDW: 13.2 % (ref 11.5–15.5)
WBC: 5.3 10*3/uL (ref 4.0–10.5)

## 2018-06-08 LAB — COMPREHENSIVE METABOLIC PANEL
ALBUMIN: 4.3 g/dL (ref 3.5–5.2)
ALK PHOS: 56 U/L (ref 39–117)
ALT: 14 U/L (ref 0–35)
AST: 17 U/L (ref 0–37)
BILIRUBIN TOTAL: 0.5 mg/dL (ref 0.2–1.2)
BUN: 17 mg/dL (ref 6–23)
CALCIUM: 9.5 mg/dL (ref 8.4–10.5)
CO2: 29 meq/L (ref 19–32)
CREATININE: 0.66 mg/dL (ref 0.40–1.20)
Chloride: 103 mEq/L (ref 96–112)
GFR: 88.17 mL/min (ref >60.00)
Glucose, Bld: 88 mg/dL (ref 70–99)
Potassium: 3.9 mEq/L (ref 3.5–5.1)
Sodium: 141 mEq/L (ref 135–145)
Total Protein: 7.4 g/dL (ref 6.0–8.3)

## 2018-06-08 LAB — LIPID PANEL
CHOL/HDL RATIO: 3
CHOLESTEROL: 144 mg/dL (ref 0–200)
HDL: 45.5 mg/dL (ref >39.00)
LDL Cholesterol: 77 mg/dL (ref 0–99)
NonHDL: 98.24
Triglycerides: 104 mg/dL (ref 0.0–149.0)
VLDL: 20.8 mg/dL (ref 0.0–40.0)

## 2018-06-08 LAB — TSH: TSH: 0.41 u[IU]/mL (ref 0.35–4.50)

## 2018-06-08 LAB — VITAMIN D 25 HYDROXY (VIT D DEFICIENCY, FRACTURES): VITD: 45.55 ng/mL (ref 30.00–100.00)

## 2018-06-08 NOTE — Progress Notes (Signed)
PCP notes:   Health maintenance:  Tetanus vaccine- postponed/insurance  Abnormal screenings:   None  Patient concerns:   None  Nurse concerns:  None  Next PCP appt:   06/11/18 @ 0830  I reviewed health advisor's note, was available for consultation, and agree with documentation and plan. Roxy Manns MD

## 2018-06-08 NOTE — Patient Instructions (Signed)
Colleen Cochran , Thank you for taking time to come for your Medicare Wellness Visit. I appreciate your ongoing commitment to your health goals. Please review the following plan we discussed and let me know if I can assist you in the future.   These are the goals we discussed: Goals    . Follow up with Primary Care Provider     Starting 06/08/2018, I will continue to take medications as prescribed and to keep appointments with PCP as scheduled.        This is a list of the screening recommended for you and due dates:  Health Maintenance  Topic Date Due  . Tetanus Vaccine  04/20/2020*  . Cologuard (Stool DNA test)  06/11/2019  . Mammogram  06/26/2019  . Flu Shot  Completed  . DEXA scan (bone density measurement)  Completed  .  Hepatitis C: One time screening is recommended by Center for Disease Control  (CDC) for  adults born from [birth year] through 1964.   Completed  . Pneumonia vaccines  Completed  *Topic was postponed. The date shown is not the original due date.   Preventive Care for Adults  A healthy lifestyle and preventive care can promote health and wellness. Preventive health guidelines for adults include the following key practices.  . A routine yearly physical is a good way to check with your health care provider about your health and preventive screening. It is a chance to share any concerns and updates on your health and to receive a thorough exam.  . Visit your dentist for a routine exam and preventive care every 6 months. Brush your teeth twice a day and floss once a day. Good oral hygiene prevents tooth decay and gum disease.  . The frequency of eye exams is based on your age, health, family medical history, use  of contact lenses, and other factors. Follow your health care provider's recommendations for frequency of eye exams.  . Eat a healthy diet. Foods like vegetables, fruits, whole grains, low-fat dairy products, and lean protein foods contain the nutrients you need  without too many calories. Decrease your intake of foods high in solid fats, added sugars, and salt. Eat the right amount of calories for you. Get information about a proper diet from your health care provider, if necessary.  . Regular physical exercise is one of the most important things you can do for your health. Most adults should get at least 150 minutes of moderate-intensity exercise (any activity that increases your heart rate and causes you to sweat) each week. In addition, most adults need muscle-strengthening exercises on 2 or more days a week.  Silver Sneakers may be a benefit available to you. To determine eligibility, you may visit the website: www.silversneakers.com or contact program at [phone] Mon-Fri between 8AM-8PM.   . Maintain a healthy weight. The body mass index (BMI) is a screening tool to identify possible weight problems. It provides an estimate of body fat based on height and weight. Your health care provider can find your BMI and can help you achieve or maintain a healthy weight.   For adults 20 years and older: ? A BMI below 18.5 is considered underweight. ? A BMI of 18.5 to 24.9 is normal. ? A BMI of 25 to 29.9 is considered overweight. ? A BMI of 30 and above is considered obese.   . Maintain normal blood lipids and cholesterol levels by exercising and minimizing your intake of saturated fat. Eat a balanced diet with plenty  of fruit and vegetables. Blood tests for lipids and cholesterol should begin at age 76 and be repeated every 5 years. If your lipid or cholesterol levels are high, you are over 50, or you are at high risk for heart disease, you may need your cholesterol levels checked more frequently. Ongoing high lipid and cholesterol levels should be treated with medicines if diet and exercise are not working.  . If you smoke, find out from your health care provider how to quit. If you do not use tobacco, please do not start.  . If you choose to drink  alcohol, please do not consume more than 2 drinks per day. One drink is considered to be 12 ounces (355 mL) of beer, 5 ounces (148 mL) of wine, or 1.5 ounces (44 mL) of liquor.  . If you are 23-33 years old, ask your health care provider if you should take aspirin to prevent strokes.  . Use sunscreen. Apply sunscreen liberally and repeatedly throughout the day. You should seek shade when your shadow is shorter than you. Protect yourself by wearing long sleeves, pants, a wide-brimmed hat, and sunglasses year round, whenever you are outdoors.  . Once a month, do a whole body skin exam, using a mirror to look at the skin on your back. Tell your health care provider of new moles, moles that have irregular borders, moles that are larger than a pencil eraser, or moles that have changed in shape or color.

## 2018-06-08 NOTE — Progress Notes (Signed)
Subjective:   Colleen Cochran is a 73 y.o. female who presents for Medicare Annual (Subsequent) preventive examination.  Review of Systems:  N/A Cardiac Risk Factors include: advanced age (>46men, >36 women);dyslipidemia;hypertension;smoking/ tobacco exposure     Objective:     Vitals: BP 120/82 (BP Location: Right Arm, Patient Position: Sitting, Cuff Size: Normal)   Pulse 98   Temp 97.7 F (36.5 C) (Oral)   Ht 5\' 5"  (1.651 m) Comment: no shoes  Wt 141 lb 3.2 oz (64 kg)   SpO2 96%   BMI 23.50 kg/m   Body mass index is 23.5 kg/m.  Advanced Directives 05/28/2017 05/27/2016 12/12/2013 11/28/2013  Does Patient Have a Medical Advance Directive? No No No Patient does not have advance directive  Would patient like information on creating a medical advance directive? Yes (MAU/Ambulatory/Procedural Areas - Information given) - - -  Pre-existing out of facility DNR order (yellow form or pink MOST form) - - - No    Tobacco Social History   Tobacco Use  Smoking Status Passive Smoke Exposure - Never Smoker  Smokeless Tobacco Never Used  Tobacco Comment   husband smokes     Counseling given: No Comment: husband smokes   Clinical Intake:  Pre-visit preparation completed: Yes  Pain : No/denies pain Pain Score: 0-No pain     Nutritional Status: BMI of 19-24  Normal Nutritional Risks: None Diabetes: No  How often do you need to have someone help you when you read instructions, pamphlets, or other written materials from your doctor or pharmacy?: 1 - Never  Interpreter Needed?: No  Information entered by :: LPinson, LPN  Past Medical History:  Diagnosis Date  . Allergy    allergic rhinitis  . Arthritis   . History of colon polyps - adenomas 12/17/2007  . Hyperlipidemia   . Hypertension   . Kidney stone   . Nephrolithiasis    Hx of  . Osteopenia    Past Surgical History:  Procedure Laterality Date  . ABDOMINAL HYSTERECTOMY  1980   total for bleeding  . CESAREAN  SECTION  1969  . COLONOSCOPY    . DILATION AND CURETTAGE OF UTERUS     x2   Family History  Problem Relation Age of Onset  . Hypertension Mother   . Osteoporosis Mother   . Heart disease Father        CABG,CAD,MI  . Kidney failure Father   . Osteoporosis Father        ? of OP pt had hip fx.  . Hypertension Brother   . Colon cancer Neg Hx   . Pancreatic cancer Neg Hx   . Rectal cancer Neg Hx   . Stomach cancer Neg Hx   . Breast cancer Neg Hx    Social History   Socioeconomic History  . Marital status: Married    Spouse name: Not on file  . Number of children: Not on file  . Years of education: Not on file  . Highest education level: Not on file  Occupational History  . Not on file  Social Needs  . Financial resource strain: Not on file  . Food insecurity:    Worry: Not on file    Inability: Not on file  . Transportation needs:    Medical: Not on file    Non-medical: Not on file  Tobacco Use  . Smoking status: Passive Smoke Exposure - Never Smoker  . Smokeless tobacco: Never Used  . Tobacco comment: husband smokes  Substance and Sexual Activity  . Alcohol use: No    Alcohol/week: 0.0 standard drinks  . Drug use: No  . Sexual activity: Never  Lifestyle  . Physical activity:    Days per week: Not on file    Minutes per session: Not on file  . Stress: Not on file  Relationships  . Social connections:    Talks on phone: Not on file    Gets together: Not on file    Attends religious service: Not on file    Active member of club or organization: Not on file    Attends meetings of clubs or organizations: Not on file    Relationship status: Not on file  Other Topics Concern  . Not on file  Social History Narrative  . Not on file    Outpatient Encounter Medications as of 06/08/2018  Medication Sig  . albuterol (PROVENTIL HFA;VENTOLIN HFA) 108 (90 Base) MCG/ACT inhaler INHALE 1 TO 2 PUFFS BY MOUTH INTO THE LUNGS EVERY 4 HOURS  . aspirin 81 MG tablet Take 81 mg  by mouth daily.  . cholecalciferol (VITAMIN D) 1000 UNITS tablet Take 1,000 Units by mouth daily.    Marland Kitchen ibuprofen (ADVIL,MOTRIN) 200 MG tablet Take 200 mg by mouth every 6 (six) hours as needed.  Marland Kitchen lisinopril-hydrochlorothiazide (PRINZIDE,ZESTORETIC) 20-12.5 MG tablet Take 0.5 tablets by mouth daily.  . simvastatin (ZOCOR) 10 MG tablet Take 1 tablet (10 mg total) by mouth daily.  . [DISCONTINUED] Calcium Citrate-Vitamin D (CALCIUM CITRATE + D3 PO) Take 1 capsule by mouth.   No facility-administered encounter medications on file as of 06/08/2018.     Activities of Daily Living In your present state of health, do you have any difficulty performing the following activities: 06/08/2018  Hearing? N  Vision? N  Difficulty concentrating or making decisions? N  Walking or climbing stairs? N  Dressing or bathing? N  Doing errands, shopping? N  Preparing Food and eating ? N  Using the Toilet? N  In the past six months, have you accidently leaked urine? N  Do you have problems with loss of bowel control? N  Managing your Medications? N  Managing your Finances? N  Housekeeping or managing your Housekeeping? N  Some recent data might be hidden    Patient Care Team: Tower, Audrie Gallus, MD as PCP - General Kassie Mends, DDS as Consulting Physician (Dentistry) Debbrah Alar, MD as Consulting Physician (Dermatology) Irene Limbo., MD as Consulting Physician (Ophthalmology)    Assessment:   This is a routine wellness examination for [name].   Hearing Screening   125Hz  250Hz  500Hz  1000Hz  2000Hz  3000Hz  4000Hz  6000Hz  8000Hz   Right ear:   40 40 40  40    Left ear:   40 40 40  40    Vision Screening Comments: Last vision exam in June 2017 with Dr. Alvester Morin (exact date 10/04/2015; confirmed with office)   Exercise Activities and Dietary recommendations Current Exercise Habits: The patient does not participate in regular exercise at present, Exercise limited by: None identified  Goals    . Follow up  with Primary Care Provider     Starting 06/08/2018, I will continue to take medications as prescribed and to keep appointments with PCP as scheduled.        Fall Risk Fall Risk  06/08/2018 05/28/2017 05/27/2016 05/13/2016 04/04/2015  Falls in the past year? 0 No No No No   Depression Screen PHQ 2/9 Scores 06/08/2018 05/28/2017 05/27/2016 05/13/2016  PHQ -  2 Score 0 0 0 0  PHQ- 9 Score 0 0 - -     Cognitive Function MMSE - Mini Mental State Exam 06/08/2018 05/28/2017 05/27/2016  Orientation to time 5 5 5   Orientation to Place 5 5 5   Registration 3 3 3   Attention/ Calculation 0 0 0  Recall 3 3 3   Language- name 2 objects 0 0 0  Language- repeat 1 1 1   Language- follow 3 step command 3 3 3   Language- read & follow direction 0 0 0  Write a sentence 0 0 0  Copy design 0 0 0  Total score 20 20 20      PLEASE NOTE: A Mini-Cog screen was completed. Maximum score is 20. A value of 0 denotes this part of Folstein MMSE was not completed or the patient failed this part of the Mini-Cog screening.   Mini-Cog Screening Orientation to Time - Max 5 pts Orientation to Place - Max 5 pts Registration - Max 3 pts Recall - Max 3 pts Language Repeat - Max 1 pts Language Follow 3 Step Command - Max 3 pts     Immunization History  Administered Date(s) Administered  . Influenza,inj,Quad PF,6+ Mos 01/09/2014, 04/04/2015, 01/09/2016, 02/10/2017, 03/11/2018  . Pneumococcal Conjugate-13 04/04/2015  . Pneumococcal Polysaccharide-23 09/30/2013  . Td 10/06/2007  . Varicella Zoster Immune Globulin 09/24/2010  . Zoster 11/24/2010    Screening Tests Health Maintenance  Topic Date Due  . TETANUS/TDAP  04/20/2020 (Originally 10/05/2017)  . Fecal DNA (Cologuard)  06/11/2019  . MAMMOGRAM  06/26/2019  . INFLUENZA VACCINE  Completed  . DEXA SCAN  Completed  . Hepatitis C Screening  Completed  . PNA vac Low Risk Adult  Completed     Plan:     I have personally reviewed, addressed, and noted the following in the  patient's chart:  A. Medical and social history B. Use of alcohol, tobacco or illicit drugs  C. Current medications and supplements D. Functional ability and status E.  Nutritional status F.  Physical activity G. Advance directives H. List of other physicians I.  Hospitalizations, surgeries, and ER visits in previous 12 months J.  Vitals K. Screenings to include hearing, vision, cognitive, depression L. Referrals and appointments - none  In addition, I have reviewed and discussed with patient certain preventive protocols, quality metrics, and best practice recommendations. A written personalized care plan for preventive services as well as general preventive health recommendations were provided to patient.  See attached scanned questionnaire for additional information.   Signed,   Randa EvensLesia Kona Yusuf, MHA, BS, LPN Health Coach

## 2018-06-09 ENCOUNTER — Other Ambulatory Visit: Payer: Self-pay | Admitting: Family Medicine

## 2018-06-11 ENCOUNTER — Encounter: Payer: Medicare Other | Admitting: Family Medicine

## 2018-06-14 ENCOUNTER — Encounter: Payer: Self-pay | Admitting: Family Medicine

## 2018-06-14 ENCOUNTER — Ambulatory Visit (INDEPENDENT_AMBULATORY_CARE_PROVIDER_SITE_OTHER): Payer: Medicare Other | Admitting: Family Medicine

## 2018-06-14 VITALS — BP 128/76 | HR 95 | Temp 98.2°F | Ht 65.0 in | Wt 140.3 lb

## 2018-06-14 DIAGNOSIS — I10 Essential (primary) hypertension: Secondary | ICD-10-CM

## 2018-06-14 DIAGNOSIS — M8589 Other specified disorders of bone density and structure, multiple sites: Secondary | ICD-10-CM

## 2018-06-14 DIAGNOSIS — Z1231 Encounter for screening mammogram for malignant neoplasm of breast: Secondary | ICD-10-CM

## 2018-06-14 DIAGNOSIS — E782 Mixed hyperlipidemia: Secondary | ICD-10-CM | POA: Diagnosis not present

## 2018-06-14 DIAGNOSIS — E559 Vitamin D deficiency, unspecified: Secondary | ICD-10-CM

## 2018-06-14 MED ORDER — LISINOPRIL-HYDROCHLOROTHIAZIDE 20-12.5 MG PO TABS: 0.5000 | ORAL_TABLET | Freq: Every day | ORAL | 3 refills | Status: DC

## 2018-06-14 MED ORDER — SIMVASTATIN 10 MG PO TABS: 10.0000 mg | ORAL_TABLET | Freq: Every day | ORAL | 3 refills | Status: DC

## 2018-06-14 NOTE — Assessment & Plan Note (Signed)
bp in fair control at this time  BP Readings from Last 1 Encounters:  06/14/18 128/76   No changes needed Most recent labs reviewed  Disc lifstyle change with low sodium diet and exercise

## 2018-06-14 NOTE — Patient Instructions (Addendum)
You can check prices at a pharmacy or the health dept for a tetanus shot if you want one   If you are interested in the new shingles vaccine (Shingrix) - call your local pharmacy to check on coverage and availability  If affordable, get on a wait list at your pharmacy to get the vaccine.  Take care of yourself  Stay active! Physically (indoors and outdoors) Labs are stable   Stop at check out to get your mammogram scheduled

## 2018-06-14 NOTE — Assessment & Plan Note (Signed)
Scheduled annual screening mammogram Nl breast exam today  Encouraged monthly self exams   

## 2018-06-14 NOTE — Assessment & Plan Note (Signed)
Disc goals for lipids and reasons to control them Rev last labs with pt Rev low sat fat diet in detail Well controlled with simvastatin and diet  

## 2018-06-14 NOTE — Assessment & Plan Note (Signed)
dexa 3/19  D level is tx in 40s  No falls or fx  Will re check in a year Disc need for calcium/ vitamin D/ wt bearing exercise and bone density test every 2 y to monitor Disc safety/ fracture risk in detail

## 2018-06-14 NOTE — Progress Notes (Signed)
Subjective:    Patient ID: Colleen Cochran, female    DOB: Dec 30, 1945, 73 y.o.   MRN: [id number]  HPI Here for annual f/u of chronic medical problems  Has been feeling ok overall   Wt Readings from Last 3 Encounters:  06/14/18 140 lb 5 oz (63.6 kg)  06/08/18 141 lb 3.2 oz (64 kg)  06/03/17 150 lb (68 kg)  has an appetite- lost wt with better diet  Exercise -not as much as she wants to (more in the summer) 23.35 kg/m   amw was 2/18 -no concerns   cologuard was neg 2/18   Mammogram 3/19 -due to schedule it  Self breast exam -no lumps   dexa 3/19 -osteopenia  D level is 45 today - taking her supplements  No falls  No fractures   zostavax 8/12 Interested in shingrix   bp is stable today  No cp or palpitations or headaches or edema  No side effects to medicines  BP Readings from Last 3 Encounters:  06/14/18 128/76  06/08/18 120/82  06/03/17 118/70     Lab Results  Component Value Date   CREATININE 0.66 06/08/2018   BUN 17 06/08/2018   NA 141 06/08/2018   K 3.9 06/08/2018   CL 103 06/08/2018   CO2 29 06/08/2018   Lab Results  Component Value Date   ALT 14 06/08/2018   AST 17 06/08/2018   ALKPHOS 56 06/08/2018   BILITOT 0.5 06/08/2018    Lab Results  Component Value Date   WBC 5.3 06/08/2018   HGB 13.0 06/08/2018   HCT 39.2 06/08/2018   MCV 89.4 06/08/2018   PLT 328.0 06/08/2018    Hyperlipidemia Lab Results  Component Value Date   CHOL 144 06/08/2018   CHOL 170 05/28/2017   CHOL 163 05/13/2016   Lab Results  Component Value Date   HDL 45.50 06/08/2018   HDL 47.20 05/28/2017   HDL 46.50 05/13/2016   Lab Results  Component Value Date   LDLCALC 77 06/08/2018   LDLCALC 102 (H) 05/28/2017   LDLCALC 93 05/13/2016   Lab Results  Component Value Date   TRIG 104.0 06/08/2018   TRIG 104.0 05/28/2017   TRIG 118.0 05/13/2016   Lab Results  Component Value Date   CHOLHDL 3 06/08/2018   CHOLHDL 4 05/28/2017   CHOLHDL 4 05/13/2016   Lab  Results  Component Value Date   LDLDIRECT 179.6 03/02/2009   On simvastatin and diet  Is eating better overall   Lab Results  Component Value Date   TSH 0.41 06/08/2018     Patient Active Problem List   Diagnosis Date Noted  . Screening mammogram, encounter for 06/03/2017  . Need for hepatitis C screening test 05/03/2016  . Second hand smoke exposure 01/09/2016  . Estrogen deficiency 04/04/2015  . Encounter for Medicare annual wellness exam 09/30/2013  . Family history of aortic aneurysm 09/30/2013  . Other screening mammogram 11/24/2011  . Routine general medical examination at a health care facility 11/17/2011  . Hypertension 09/24/2010  . POSTMENOPAUSAL STATUS 06/24/2010  . Vitamin D deficiency 03/02/2009  . History of colon polyps - adenomas 12/17/2007  . Hyperlipidemia 10/05/2007  . SINUSITIS, RECURRENT 10/05/2007  . ALLERGIC RHINITIS 10/05/2007  . Osteopenia 10/05/2007  . NEPHROLITHIASIS, HX OF 10/05/2007   Past Medical History:  Diagnosis Date  . Allergy    allergic rhinitis  . Arthritis   . History of colon polyps - adenomas 12/17/2007  . Hyperlipidemia   .  Hypertension   . Kidney stone   . Nephrolithiasis    Hx of  . Osteopenia    Past Surgical History:  Procedure Laterality Date  . ABDOMINAL HYSTERECTOMY  1980   total for bleeding  . CESAREAN SECTION  1969  . COLONOSCOPY    . DILATION AND CURETTAGE OF UTERUS     x2   Social History   Tobacco Use  . Smoking status: Passive Smoke Exposure - Never Smoker  . Smokeless tobacco: Never Used  . Tobacco comment: husband smokes  Substance Use Topics  . Alcohol use: No    Alcohol/week: 0.0 standard drinks  . Drug use: No   Family History  Problem Relation Age of Onset  . Hypertension Mother   . Osteoporosis Mother   . Heart disease Father        CABG,CAD,MI  . Kidney failure Father   . Osteoporosis Father        ? of OP pt had hip fx.  . Hypertension Brother   . Colon cancer Neg Hx   .  Pancreatic cancer Neg Hx   . Rectal cancer Neg Hx   . Stomach cancer Neg Hx   . Breast cancer Neg Hx    No Known Allergies Current Outpatient Medications on File Prior to Visit  Medication Sig Dispense Refill  . albuterol (PROVENTIL HFA;VENTOLIN HFA) 108 (90 Base) MCG/ACT inhaler INHALE 1 TO 2 PUFFS BY MOUTH INTO THE LUNGS EVERY 4 HOURS 18 g 5  . aspirin 81 MG tablet Take 81 mg by mouth daily.    . cholecalciferol (VITAMIN D) 1000 UNITS tablet Take 1,000 Units by mouth daily.      Marland Kitchen ibuprofen (ADVIL,MOTRIN) 200 MG tablet Take 200 mg by mouth every 6 (six) hours as needed.     No current facility-administered medications on file prior to visit.     Review of Systems  Constitutional: Negative for activity change, appetite change, fatigue, fever and unexpected weight change.  HENT: Negative for congestion, ear pain, rhinorrhea, sinus pressure and sore throat.   Eyes: Negative for pain, redness and visual disturbance.  Respiratory: Negative for cough, shortness of breath and wheezing.   Cardiovascular: Negative for chest pain and palpitations.  Gastrointestinal: Negative for abdominal pain, blood in stool, constipation and diarrhea.  Endocrine: Negative for polydipsia and polyuria.  Genitourinary: Negative for dysuria, frequency and urgency.  Musculoskeletal: Negative for arthralgias, back pain and myalgias.  Skin: Negative for pallor and rash.  Allergic/Immunologic: Negative for environmental allergies.  Neurological: Negative for dizziness, syncope and headaches.  Hematological: Negative for adenopathy. Does not bruise/bleed easily.  Psychiatric/Behavioral: Negative for decreased concentration and dysphoric mood. The patient is not nervous/anxious.        Objective:   Physical Exam Constitutional:      General: She is not in acute distress.    Appearance: Normal appearance. She is well-developed and normal weight.  HENT:     Head: Normocephalic and atraumatic.     Right Ear:  Tympanic membrane, ear canal and external ear normal.     Left Ear: Tympanic membrane, ear canal and external ear normal.     Ears:     Comments: Partial cerumen    Nose: Nose normal.     Mouth/Throat:     Mouth: Mucous membranes are moist.     Pharynx: Oropharynx is clear.  Eyes:     General: No scleral icterus.    Conjunctiva/sclera: Conjunctivae normal.     Pupils: Pupils  are equal, round, and reactive to light.  Neck:     Musculoskeletal: Normal range of motion and neck supple.     Thyroid: No thyromegaly.     Vascular: No carotid bruit or JVD.  Cardiovascular:     Rate and Rhythm: Regular rhythm. Tachycardia present.     Pulses: Normal pulses.     Heart sounds: Normal heart sounds. No gallop.   Pulmonary:     Effort: Pulmonary effort is normal. No respiratory distress.     Breath sounds: Normal breath sounds. No wheezing.  Chest:     Chest wall: No tenderness.  Abdominal:     General: Bowel sounds are normal. There is no distension or abdominal bruit.     Palpations: Abdomen is soft. There is no mass.     Tenderness: There is no abdominal tenderness.  Genitourinary:    Comments: Breast exam: No mass, nodules, thickening, tenderness, bulging, retraction, inflamation, nipple discharge or skin changes noted.  No axillary or clavicular LA.     Musculoskeletal: Normal range of motion.        General: No tenderness.     Comments: No kyphosis   Lymphadenopathy:     Cervical: No cervical adenopathy.  Skin:    General: Skin is warm and dry.     Capillary Refill: Capillary refill takes less than 2 seconds.     Coloration: Skin is not pale.     Findings: No erythema or rash.  Neurological:     General: No focal deficit present.     Mental Status: She is alert.     Cranial Nerves: No cranial nerve deficit.     Motor: No abnormal muscle tone.     Coordination: Coordination normal.     Deep Tendon Reflexes: Reflexes are normal and symmetric. Reflexes normal.  Psychiatric:         Mood and Affect: Mood normal.     Comments: Pleasant            Assessment & Plan:   Problem List Items Addressed This Visit      Cardiovascular and Mediastinum   Hypertension - Primary    bp in fair control at this time  BP Readings from Last 1 Encounters:  06/14/18 128/76   No changes needed Most recent labs reviewed  Disc lifstyle change with low sodium diet and exercise        Relevant Medications   lisinopril-hydrochlorothiazide (PRINZIDE,ZESTORETIC) 20-12.5 MG tablet   simvastatin (ZOCOR) 10 MG tablet     Musculoskeletal and Integument   Osteopenia    dexa 3/19  D level is tx in 40s  No falls or fx  Will re check in a year Disc need for calcium/ vitamin D/ wt bearing exercise and bone density test every 2 y to monitor Disc safety/ fracture risk in detail          Other   Vitamin D deficiency    Vitamin D level is therapeutic with current supplementation Disc importance of this to bone and overall health Level in 40s       Hyperlipidemia    Disc goals for lipids and reasons to control them Rev last labs with pt Rev low sat fat diet in detail Well controlled with simvastatin and diet       Relevant Medications   lisinopril-hydrochlorothiazide (PRINZIDE,ZESTORETIC) 20-12.5 MG tablet   simvastatin (ZOCOR) 10 MG tablet   Screening mammogram, encounter for    Scheduled annual screening mammogram Nl breast  exam today  Encouraged monthly self exams        Relevant Orders   MM 3D SCREEN BREAST BILATERAL

## 2018-06-14 NOTE — Assessment & Plan Note (Signed)
Vitamin D level is therapeutic with current supplementation Disc importance of this to bone and overall health Level in 40s 

## 2018-06-20 ENCOUNTER — Other Ambulatory Visit: Payer: Self-pay | Admitting: Family Medicine

## 2018-08-16 ENCOUNTER — Ambulatory Visit: Payer: Medicare Other

## 2018-09-27 ENCOUNTER — Other Ambulatory Visit: Payer: Self-pay

## 2018-09-27 ENCOUNTER — Ambulatory Visit
Admission: RE | Admit: 2018-09-27 | Discharge: 2018-09-27 | Disposition: A | Discharge status: Home / Self Care | Payer: Medicare Other | Source: Ambulatory Visit | Attending: Family Medicine | Admitting: Family Medicine

## 2018-09-27 DIAGNOSIS — Z1231 Encounter for screening mammogram for malignant neoplasm of breast: Secondary | ICD-10-CM

## 2018-12-28 ENCOUNTER — Encounter: Payer: Self-pay | Admitting: Family Medicine

## 2018-12-28 ENCOUNTER — Ambulatory Visit (INDEPENDENT_AMBULATORY_CARE_PROVIDER_SITE_OTHER): Payer: Medicare Other | Admitting: Family Medicine

## 2018-12-28 ENCOUNTER — Other Ambulatory Visit: Payer: Self-pay

## 2018-12-28 DIAGNOSIS — B9789 Other viral agents as the cause of diseases classified elsewhere: Secondary | ICD-10-CM | POA: Diagnosis not present

## 2018-12-28 DIAGNOSIS — J069 Acute upper respiratory infection, unspecified: Secondary | ICD-10-CM

## 2018-12-28 DIAGNOSIS — Z20822 Contact with and (suspected) exposure to covid-19: Secondary | ICD-10-CM

## 2018-12-28 NOTE — Assessment & Plan Note (Signed)
Day 4 -mild symptoms including hoarseness that improved covid-19 test ordered for [place] location-pt will go there today  Symptomatic care Fluids Rest  inst to call if fever or other new symptoms develop Will isolate until her result returns Update if not starting to improve in a week or if worsening

## 2018-12-28 NOTE — Progress Notes (Signed)
Virtual Visit via Telephone Note  I connected with Colleen Cochran on 12/28/18 at  2:00 PM EDT by telephone and verified that I am speaking with the correct person using two identifiers.  Location: Patient: home Provider: office   I discussed the limitations, risks, security and privacy concerns of performing an evaluation and management service by telephone and the availability of in person appointments. I also discussed with the patient that there may be a patient responsible charge related to this service. The patient expressed understanding and agreed to proceed.   History of Present Illness: Pt presents with uri symptoms since Saturday   Lost voice Sunday - a little better today  Ears itch Teeth hurt and cheekbone area- pressure above eyes also   Scratchy throat-not bad  Some pnd in her throat -worse at night   A little stuffy/runny nose  All clear d/c   occ cough - non productive - dry  Using albuterol -but no wheezing   Temp 98.0 No chills or aches or sweats   She uses a mask  Goes to the grocery and drug stores   No travel or family gatherings at all  No known covid exp  Husband was in the hospital with copd-neg covid test   She takes nasal decongestant from [facility] Cough it not bad enough for medication  No analgesics    Patient Active Problem List   Diagnosis Date Noted  . Viral URI with cough 12/28/2018  . Screening mammogram, encounter for 06/03/2017  . Need for hepatitis C screening test 05/03/2016  . Second hand smoke exposure 01/09/2016  . Estrogen deficiency 04/04/2015  . Encounter for Medicare annual wellness exam 09/30/2013  . Family history of aortic aneurysm 09/30/2013  . Other screening mammogram 11/24/2011  . Routine general medical examination at a health care facility 11/17/2011  . Hypertension 09/24/2010  . POSTMENOPAUSAL STATUS 06/24/2010  . Vitamin D deficiency 03/02/2009  . History of colon polyps - adenomas 12/17/2007  .  Hyperlipidemia 10/05/2007  . SINUSITIS, RECURRENT 10/05/2007  . ALLERGIC RHINITIS 10/05/2007  . Osteopenia 10/05/2007  . NEPHROLITHIASIS, HX OF 10/05/2007   Past Medical History:  Diagnosis Date  . Allergy    allergic rhinitis  . Arthritis   . History of colon polyps - adenomas 12/17/2007  . Hyperlipidemia   . Hypertension   . Kidney stone   . Nephrolithiasis    Hx of  . Osteopenia    Past Surgical History:  Procedure Laterality Date  . ABDOMINAL HYSTERECTOMY  1980   total for bleeding  . [place]  . COLONOSCOPY    . DILATION AND CURETTAGE OF UTERUS     x2   Social History   Tobacco Use  . Smoking status: Passive Smoke Exposure - Never Smoker  . Smokeless tobacco: Never Used  . Tobacco comment: husband smokes  Substance Use Topics  . Alcohol use: No    Alcohol/week: 0.0 standard drinks  . Drug use: No   Family History  Problem Relation Age of Onset  . Hypertension Mother   . Osteoporosis Mother   . Heart disease Father        CABG,CAD,MI  . Kidney failure Father   . Osteoporosis Father        ? of OP pt had hip fx.  . Hypertension Brother   . Colon cancer Neg Hx   . Pancreatic cancer Neg Hx   . Rectal cancer Neg Hx   . Stomach cancer Neg  Hx   . Breast cancer Neg Hx    No Known Allergies Current Outpatient Medications on File Prior to Visit  Medication Sig Dispense Refill  . albuterol (PROVENTIL HFA;VENTOLIN HFA) 108 (90 Base) MCG/ACT inhaler INHALE 1 TO 2 PUFFS BY MOUTH INTO THE LUNGS EVERY 4 HOURS 18 g 5  . aspirin 81 MG tablet Take 81 mg by mouth daily.    . cholecalciferol (VITAMIN D) 1000 UNITS tablet Take 1,000 Units by mouth daily.      Marland Kitchen. ibuprofen (ADVIL,MOTRIN) 200 MG tablet Take 200 mg by mouth every 6 (six) hours as needed.    Marland Kitchen. lisinopril-hydrochlorothiazide (PRINZIDE,ZESTORETIC) 20-12.5 MG tablet Take 0.5 tablets by mouth daily. 45 tablet 3  . simvastatin (ZOCOR) 10 MG tablet Take 1 tablet (10 mg total) by mouth daily. 90 tablet  3   No current facility-administered medications on file prior to visit.      Review of Systems  Constitutional: Negative for chills, fever and malaise/fatigue.  HENT: Positive for congestion and sore throat. Negative for ear discharge and ear pain.   Eyes: Negative for discharge and redness.  Respiratory: Positive for cough. Negative for sputum production, shortness of breath and wheezing.   Cardiovascular: Negative for chest pain and palpitations.  Gastrointestinal: Negative for abdominal pain, diarrhea, nausea and vomiting.  Musculoskeletal: Negative for myalgias.  Skin: Negative for rash.  Neurological: Negative for dizziness and headaches.    Observations/Objective: Pt sounds mildly hoarse and congested (nasal) Nl mood and cognition  Not in distress  No cough during interview and not sob during speech  She is able to hear call well and voiced understanding of instructions   Assessment and Plan: Problem List Items Addressed This Visit      Respiratory   Viral URI with cough    Day 4 -mild symptoms including hoarseness that improved covid-19 test ordered for [place] location-pt will go there today  Symptomatic care Fluids Rest  inst to call if fever or other new symptoms develop Will isolate until her result returns Update if not starting to improve in a week or if worsening        Relevant Orders   Novel Coronavirus, NAA ([facility])       Follow Up Instructions: Drink fluids and rest  otc antihistamine as needed  Call us if you develop fever or other new symptoms  Rest voice-that will help it come back sooner Watch for sinus pain  Go to the [facility] [place] location for a covid test now  Isolate yourself until you get a result   Update if not starting to improve in a week or if worsening     I discussed the assessment and treatment plan with the patient. The patient was provided an opportunity to ask questions and all were answered. The patient agreed with  the plan and demonstrated an understanding of the instructions.   The patient was advised to call back or seek an in-person evaluation if the symptoms worsen or if the condition fails to improve as anticipated.  I provided 18 minutes of non-face-to-face time during this encounter.   Roxy MannsMarne Ocia Simek, MD

## 2018-12-28 NOTE — Patient Instructions (Signed)
Drink fluids and rest  otc antihistamine as needed  Call us if you develop fever or other new symptoms  Rest voice-that will help it come back sooner Watch for sinus pain  Go to the [facility] [place] location for a covid test now  Isolate yourself until you get a result   Update if not starting to improve in a week or if worsening

## 2018-12-29 LAB — NOVEL CORONAVIRUS, NAA: SARS-CoV-2, NAA: NOT DETECTED

## 2018-12-30 ENCOUNTER — Telehealth: Payer: Self-pay | Admitting: Family Medicine

## 2018-12-30 ENCOUNTER — Telehealth: Payer: Self-pay | Admitting: *Deleted

## 2018-12-30 MED ORDER — AMOXICILLIN-POT CLAVULANATE 875-125 MG PO TABS: 1.0000 | ORAL_TABLET | Freq: Two times a day (BID) | ORAL | 0 refills | Status: DC

## 2018-12-30 NOTE — Telephone Encounter (Signed)
Spoke with pt. Advised of negative Covid test. Pt said sxs have worsened she thinks she has a sinus inf. She is sttill hoarse still has a ST especially at night she's coughing and has nasal congestion but the phlegm and mucus she is blowing out her nose is green/yellow. Pt also has facial/sinus pain around her eyes, nose and forehead.  [facility] [address]. On file

## 2018-12-30 NOTE — Telephone Encounter (Signed)
Negative COVID results given. Patient results "NOT Detected." Caller expressed understanding. ° °

## 2018-12-30 NOTE — Telephone Encounter (Signed)
Left VM requesting pt to call the office regarding her Covid test

## 2018-12-30 NOTE — Telephone Encounter (Signed)
I sent augmentin  Keep me posted

## 2018-12-30 NOTE — Telephone Encounter (Signed)
Patient returned your call in regards to her covid test .

## 2018-12-31 NOTE — Telephone Encounter (Signed)
Pt notified Rx sent 

## 2019-01-21 ENCOUNTER — Ambulatory Visit (INDEPENDENT_AMBULATORY_CARE_PROVIDER_SITE_OTHER): Payer: Medicare Other

## 2019-01-21 DIAGNOSIS — Z23 Encounter for immunization: Secondary | ICD-10-CM

## 2019-06-12 ENCOUNTER — Telehealth: Payer: Self-pay | Admitting: Family Medicine

## 2019-06-12 ENCOUNTER — Ambulatory Visit: Payer: Medicare Other | Attending: Internal Medicine

## 2019-06-12 DIAGNOSIS — E782 Mixed hyperlipidemia: Secondary | ICD-10-CM

## 2019-06-12 DIAGNOSIS — Z23 Encounter for immunization: Secondary | ICD-10-CM

## 2019-06-12 DIAGNOSIS — E559 Vitamin D deficiency, unspecified: Secondary | ICD-10-CM

## 2019-06-12 DIAGNOSIS — I10 Essential (primary) hypertension: Secondary | ICD-10-CM

## 2019-06-12 NOTE — Progress Notes (Signed)
   Covid-19 Vaccination Clinic  Name:  Colleen Cochran    MRN: [id number] DOB: 26-Apr-1945  06/12/2019  [name] was observed post Covid-19 immunization for 15 minutes without incidence. She was provided with Vaccine Information Sheet and instruction to access the V-Safe system.   [name] was instructed to call 911 with any severe reactions post vaccine: Marland Kitchen Difficulty breathing  . Swelling of your face and throat  . A fast heartbeat  . A bad rash all over your body  . Dizziness and weakness    Immunizations Administered    Name Date Dose VIS Date Route   Pfizer COVID-19 Vaccine 06/12/2019  8:28 AM 0.3 mL 04/01/2019 Intramuscular   Manufacturer: [employer], [employer]   Lot: [id number]   NDC: [address]-5

## 2019-06-12 NOTE — Telephone Encounter (Signed)
-----   Message from Aquilla Solian, RT sent at 06/02/2019  1:17 PM EST ----- Regarding: Lab Orders for Monday 2.22.2021 Please place lab orders for Monday 2.22.2021, office visit for physical on Friday 2.26.2021 Thank you, Jones Bales RT(R)

## 2019-06-13 ENCOUNTER — Other Ambulatory Visit: Payer: Self-pay

## 2019-06-13 ENCOUNTER — Other Ambulatory Visit (INDEPENDENT_AMBULATORY_CARE_PROVIDER_SITE_OTHER): Payer: Medicare Other

## 2019-06-13 ENCOUNTER — Ambulatory Visit: Payer: Medicare Other

## 2019-06-13 ENCOUNTER — Ambulatory Visit (INDEPENDENT_AMBULATORY_CARE_PROVIDER_SITE_OTHER): Payer: Medicare Other

## 2019-06-13 VITALS — BP 111/67 | Wt 145.0 lb

## 2019-06-13 DIAGNOSIS — E559 Vitamin D deficiency, unspecified: Secondary | ICD-10-CM | POA: Diagnosis not present

## 2019-06-13 DIAGNOSIS — E782 Mixed hyperlipidemia: Secondary | ICD-10-CM

## 2019-06-13 DIAGNOSIS — I10 Essential (primary) hypertension: Secondary | ICD-10-CM

## 2019-06-13 DIAGNOSIS — Z0000 Encounter for general adult medical examination without abnormal findings: Secondary | ICD-10-CM

## 2019-06-13 LAB — CBC WITH DIFFERENTIAL/PLATELET
Basophils Absolute: 0 10*3/uL (ref 0.0–0.1)
Basophils Relative: 0.8 % (ref 0.0–3.0)
Eosinophils Absolute: 0.1 10*3/uL (ref 0.0–0.7)
Eosinophils Relative: 2.7 % (ref 0.0–5.0)
HCT: 39.5 % (ref 36.0–46.0)
Hemoglobin: 13.3 g/dL (ref 12.0–15.0)
Lymphocytes Relative: 25.7 % (ref 12.0–46.0)
Lymphs Abs: 1.4 10*3/uL (ref 0.7–4.0)
MCHC: 33.6 g/dL (ref 30.0–36.0)
MCV: 89.7 fl (ref 78.0–100.0)
Monocytes Absolute: 0.6 10*3/uL (ref 0.1–1.0)
Monocytes Relative: 10.6 % (ref 3.0–12.0)
Neutro Abs: 3.2 10*3/uL (ref 1.4–7.7)
Neutrophils Relative %: 60.2 % (ref 43.0–77.0)
Platelets: 341 10*3/uL (ref 150.0–400.0)
RBC: 4.4 Mil/uL (ref 3.87–5.11)
RDW: 13.1 % (ref 11.5–15.5)
WBC: 5.4 10*3/uL (ref 4.0–10.5)

## 2019-06-13 LAB — COMPREHENSIVE METABOLIC PANEL
ALT: 17 U/L (ref 0–35)
AST: 18 U/L (ref 0–37)
Albumin: 4.6 g/dL (ref 3.5–5.2)
Alkaline Phosphatase: 61 U/L (ref 39–117)
BUN: 15 mg/dL (ref 6–23)
CO2: 27 mEq/L (ref 19–32)
Calcium: 10 mg/dL (ref 8.4–10.5)
Chloride: 104 mEq/L (ref 96–112)
Creatinine, Ser: 0.74 mg/dL (ref 0.40–1.20)
GFR: 77.04 mL/min (ref >60.00)
Glucose, Bld: 95 mg/dL (ref 70–99)
Potassium: 4.1 mEq/L (ref 3.5–5.1)
Sodium: 142 mEq/L (ref 135–145)
Total Bilirubin: 0.6 mg/dL (ref 0.2–1.2)
Total Protein: 7.8 g/dL (ref 6.0–8.3)

## 2019-06-13 LAB — TSH: TSH: 1.21 u[IU]/mL (ref 0.35–4.50)

## 2019-06-13 LAB — VITAMIN D 25 HYDROXY (VIT D DEFICIENCY, FRACTURES): VITD: 50.38 ng/mL (ref 30.00–100.00)

## 2019-06-13 NOTE — Patient Instructions (Signed)
Colleen Cochran , Thank you for taking time to come for your Medicare Wellness Visit. I appreciate your ongoing commitment to your health goals. Please review the following plan we discussed and let me know if I can assist you in the future.   Screening recommendations/referrals: Colonoscopy: Up to date, completed 12/12/2013 Mammogram: Up to date, completed 09/27/2018 Bone Density: Up to date, completed 06/25/2017 Recommended yearly ophthalmology/optometry visit for glaucoma screening and checkup Recommended yearly dental visit for hygiene and checkup  Vaccinations: Influenza vaccine: Up to date, completed 01/21/2019 Pneumococcal vaccine: Completed series Tdap vaccine: decline Shingles vaccine: discussed    Advanced directives: Advance directive discussed with you today. Even though you declined this today please call our office should you change your mind and we can give you the proper paperwork for you to fill out.  Conditions/risks identified: hypertension, hyperlipidemia  Next appointment: 06/17/2019 @ 11:30 am    Preventive Care 73 Years and Older, Female Preventive care refers to lifestyle choices and visits with your health care provider that can promote health and wellness. What does preventive care include?  A yearly physical exam. This is also called an annual well check.  Dental exams once or twice a year.  Routine eye exams. Ask your health care provider how often you should have your eyes checked.  Personal lifestyle choices, including:  Daily care of your teeth and gums.  Regular physical activity.  Eating a healthy diet.  Avoiding tobacco and drug use.  Limiting alcohol use.  Practicing safe sex.  Taking low-dose aspirin every day.  Taking vitamin and mineral supplements as recommended by your health care provider. What happens during an annual well check? The services and screenings done by your health care provider during your annual well check will depend on  your age, overall health, lifestyle risk factors, and family history of disease. Counseling  Your health care provider may ask you questions about your:  Alcohol use.  Tobacco use.  Drug use.  Emotional well-being.  Home and relationship well-being.  Sexual activity.  Eating habits.  History of falls.  Memory and ability to understand (cognition).  Work and work Astronomer.  Reproductive health. Screening  You may have the following tests or measurements:  Height, weight, and BMI.  Blood pressure.  Lipid and cholesterol levels. These may be checked every 5 years, or more frequently if you are over 17 years old.  Skin check.  Lung cancer screening. You may have this screening every year starting at age 73 if you have a 30-pack-year history of smoking and currently smoke or have quit within the past 15 years.  Fecal occult blood test (FOBT) of the stool. You may have this test every year starting at age 73.  Flexible sigmoidoscopy or colonoscopy. You may have a sigmoidoscopy every 5 years or a colonoscopy every 10 years starting at age 73.  Hepatitis C blood test.  Hepatitis B blood test.  Sexually transmitted disease (STD) testing.  Diabetes screening. This is done by checking your blood sugar (glucose) after you have not eaten for a while (fasting). You may have this done every 1-3 years.  Bone density scan. This is done to screen for osteoporosis. You may have this done starting at age 73.  Mammogram. This may be done every 1-2 years. Talk to your health care provider about how often you should have regular mammograms. Talk with your health care provider about your test results, treatment options, and if necessary, the need for more tests.  Vaccines  Your health care provider may recommend certain vaccines, such as:  Influenza vaccine. This is recommended every year.  Tetanus, diphtheria, and acellular pertussis (Tdap, Td) vaccine. You may need a Td booster  every 10 years.  Zoster vaccine. You may need this after age 73.  Pneumococcal 13-valent conjugate (PCV13) vaccine. One dose is recommended after age 73.  Pneumococcal polysaccharide (PPSV23) vaccine. One dose is recommended after age 73. Talk to your health care provider about which screenings and vaccines you need and how often you need them. This information is not intended to replace advice given to you by your health care provider. Make sure you discuss any questions you have with your health care provider. Document Released: 05/04/2015 Document Revised: 12/26/2015 Document Reviewed: 02/06/2015 Elsevier Interactive Patient Education  2017 [place] Prevention in the Home Falls can cause injuries. They can happen to people of all ages. There are many things you can do to make your home safe and to help prevent falls. What can I do on the outside of my home?  Regularly fix the edges of walkways and driveways and fix any cracks.  Remove anything that might make you trip as you walk through a door, such as a raised step or threshold.  Trim any bushes or trees on the path to your home.  Use bright outdoor lighting.  Clear any walking paths of anything that might make someone trip, such as rocks or tools.  Regularly check to see if handrails are loose or broken. Make sure that both sides of any steps have handrails.  Any raised decks and porches should have guardrails on the edges.  Have any leaves, snow, or ice cleared regularly.  Use sand or salt on walking paths during winter.  Clean up any spills in your garage right away. This includes oil or grease spills. What can I do in the bathroom?  Use night lights.  Install grab bars by the toilet and in the tub and shower. Do not use towel bars as grab bars.  Use non-skid mats or decals in the tub or shower.  If you need to sit down in the shower, use a plastic, non-slip stool.  Keep the floor dry. Clean up any  water that spills on the floor as soon as it happens.  Remove soap buildup in the tub or shower regularly.  Attach bath mats securely with double-sided non-slip rug tape.  Do not have throw rugs and other things on the floor that can make you trip. What can I do in the bedroom?  Use night lights.  Make sure that you have a light by your bed that is easy to reach.  Do not use any sheets or blankets that are too big for your bed. They should not hang down onto the floor.  Have a firm chair that has side arms. You can use this for support while you get dressed.  Do not have throw rugs and other things on the floor that can make you trip. What can I do in the kitchen?  Clean up any spills right away.  Avoid walking on wet floors.  Keep items that you use a lot in easy-to-reach places.  If you need to reach something above you, use a strong step stool that has a grab bar.  Keep electrical cords out of the way.  Do not use floor polish or wax that makes floors slippery. If you must use wax, use non-skid floor wax.  Do not have throw rugs and other things on the floor that can make you trip. What can I do with my stairs?  Do not leave any items on the stairs.  Make sure that there are handrails on both sides of the stairs and use them. Fix handrails that are broken or loose. Make sure that handrails are as long as the stairways.  Check any carpeting to make sure that it is firmly attached to the stairs. Fix any carpet that is loose or worn.  Avoid having throw rugs at the top or bottom of the stairs. If you do have throw rugs, attach them to the floor with carpet tape.  Make sure that you have a light switch at the top of the stairs and the bottom of the stairs. If you do not have them, ask someone to add them for you. What else can I do to help prevent falls?  Wear shoes that:  Do not have high heels.  Have rubber bottoms.  Are comfortable and fit you well.  Are closed  at the toe. Do not wear sandals.  If you use a stepladder:  Make sure that it is fully opened. Do not climb a closed stepladder.  Make sure that both sides of the stepladder are locked into place.  Ask someone to hold it for you, if possible.  Clearly mark and make sure that you can see:  Any grab bars or handrails.  First and last steps.  Where the edge of each step is.  Use tools that help you move around (mobility aids) if they are needed. These include:  Canes.  Walkers.  Scooters.  Crutches.  Turn on the lights when you go into a dark area. Replace any light bulbs as soon as they burn out.  Set up your furniture so you have a clear path. Avoid moving your furniture around.  If any of your floors are uneven, fix them.  If there are any pets around you, be aware of where they are.  Review your medicines with your doctor. Some medicines can make you feel dizzy. This can increase your chance of falling. Ask your doctor what other things that you can do to help prevent falls. This information is not intended to replace advice given to you by your health care provider. Make sure you discuss any questions you have with your health care provider. Document Released: 02/01/2009 Document Revised: 09/13/2015 Document Reviewed: 05/12/2014 Elsevier Interactive Patient Education  2017 [employer].

## 2019-06-13 NOTE — Progress Notes (Signed)
Subjective:   Colleen Cochran is a 74 y.o. female who presents for Medicare Annual (Subsequent) preventive examination.  Review of Systems: N/A   This visit is being conducted through telemedicine via telephone at the nurse health advisor's home address due to the COVID-19 pandemic. This patient has given me verbal consent via doximity to conduct this visit, patient states they are participating from their home address. Patient and myself are on the telephone call. There is no referral for this visit. Some vital signs may be absent or patient reported.    Patient identification: identified by name, DOB, and current address   Cardiac Risk Factors include: advanced age (>25men, >25 women);hypertension;dyslipidemia     Objective:     Vitals: BP 111/67   Wt 145 lb (65.8 kg)   BMI 24.13 kg/m   Body mass index is 24.13 kg/m.  Advanced Directives 06/13/2019 06/08/2018 05/28/2017 05/27/2016 12/12/2013 11/28/2013  Does Patient Have a Medical Advance Directive? No No No No No Patient does not have advance directive  Would patient like information on creating a medical advance directive? No - Patient declined No - Patient declined Yes (MAU/Ambulatory/Procedural Areas - Information given) - - -  Pre-existing out of facility DNR order (yellow form or pink MOST form) - - - - - No    Tobacco Social History   Tobacco Use  Smoking Status Passive Smoke Exposure - Never Smoker  Smokeless Tobacco Never Used  Tobacco Comment   husband smokes     Counseling given: Not Answered Comment: husband smokes   Clinical Intake:  Pre-visit preparation completed: Yes  Pain : No/denies pain     Nutritional Risks: None Diabetes: No  How often do you need to have someone help you when you read instructions, pamphlets, or other written materials from your doctor or pharmacy?: 1 - Never What is the last grade level you completed in school?: 12th, 2 year college degree  Interpreter Needed?:  No  Information entered by :: CJohnson, LPN  Past Medical History:  Diagnosis Date  . Allergy    allergic rhinitis  . Arthritis   . History of colon polyps - adenomas 12/17/2007  . Hyperlipidemia   . Hypertension   . Kidney stone   . Nephrolithiasis    Hx of  . Osteopenia    Past Surgical History:  Procedure Laterality Date  . ABDOMINAL HYSTERECTOMY  1980   total for bleeding  . CESAREAN SECTION  1969  . COLONOSCOPY    . DILATION AND CURETTAGE OF UTERUS     x2   Family History  Problem Relation Age of Onset  . Hypertension Mother   . Osteoporosis Mother   . Heart disease Father        CABG,CAD,MI  . Kidney failure Father   . Osteoporosis Father        ? of OP pt had hip fx.  . Hypertension Brother   . Colon cancer Neg Hx   . Pancreatic cancer Neg Hx   . Rectal cancer Neg Hx   . Stomach cancer Neg Hx   . Breast cancer Neg Hx    Social History   Socioeconomic History  . Marital status: Married    Spouse name: Not on file  . Number of children: Not on file  . Years of education: Not on file  . Highest education level: Not on file  Occupational History  . Not on file  Tobacco Use  . Smoking status: Passive Smoke Exposure -  Never Smoker  . Smokeless tobacco: Never Used  . Tobacco comment: husband smokes  Substance and Sexual Activity  . Alcohol use: No    Alcohol/week: 0.0 standard drinks  . Drug use: No  . Sexual activity: Never  Other Topics Concern  . Not on file  Social History Narrative  . Not on file   Social Determinants of Health   Financial Resource Strain: Low Risk   . Difficulty of Paying Living Expenses: Not hard at all  Food Insecurity: No Food Insecurity  . Worried About Charity fundraiser in the Last Year: Never true  . Ran Out of Food in the Last Year: Never true  Transportation Needs: No Transportation Needs  . Lack of Transportation (Medical): No  . Lack of Transportation (Non-Medical): No  Physical Activity: Inactive  . Days  of Exercise per Week: 0 days  . Minutes of Exercise per Session: 0 min  Stress: No Stress Concern Present  . Feeling of Stress : Not at all  Social Connections:   . Frequency of Communication with Friends and Family: Not on file  . Frequency of Social Gatherings with Friends and Family: Not on file  . Attends Religious Services: Not on file  . Active Member of Clubs or Organizations: Not on file  . Attends Archivist Meetings: Not on file  . Marital Status: Not on file    Outpatient Encounter Medications as of 06/13/2019  Medication Sig  . albuterol (PROVENTIL HFA;VENTOLIN HFA) 108 (90 Base) MCG/ACT inhaler INHALE 1 TO 2 PUFFS BY MOUTH INTO THE LUNGS EVERY 4 HOURS  . aspirin 81 MG tablet Take 81 mg by mouth daily.  . cholecalciferol (VITAMIN D) 1000 UNITS tablet Take 1,000 Units by mouth daily.    Marland Kitchen ibuprofen (ADVIL,MOTRIN) 200 MG tablet Take 200 mg by mouth every 6 (six) hours as needed.  Marland Kitchen lisinopril-hydrochlorothiazide (PRINZIDE,ZESTORETIC) 20-12.5 MG tablet Take 0.5 tablets by mouth daily.  . simvastatin (ZOCOR) 10 MG tablet Take 1 tablet (10 mg total) by mouth daily.  Marland Kitchen amoxicillin-clavulanate (AUGMENTIN) 875-125 MG tablet Take 1 tablet by mouth 2 (two) times daily. (Patient not taking: Reported on 06/13/2019)   No facility-administered encounter medications on file as of 06/13/2019.    Activities of Daily Living In your present state of health, do you have any difficulty performing the following activities: 06/13/2019  Hearing? N  Vision? N  Difficulty concentrating or making decisions? N  Walking or climbing stairs? N  Dressing or bathing? N  Doing errands, shopping? N  Preparing Food and eating ? N  Using the Toilet? N  In the past six months, have you accidently leaked urine? N  Do you have problems with loss of bowel control? N  Managing your Medications? N  Managing your Finances? N  Housekeeping or managing your Housekeeping? N  Some recent data might be  hidden    Patient Care Team: Tower, Wynelle Fanny, MD as PCP - General Ceasar Mons, DDS as Consulting Physician (Dentistry) Oneta Rack, MD as Consulting Physician (Dermatology) Lorelee Cover., MD as Consulting Physician (Ophthalmology)    Assessment:   This is a routine wellness examination for [place].  Exercise Activities and Dietary recommendations Current Exercise Habits: The patient does not participate in regular exercise at present, Exercise limited by: None identified  Goals    . Follow up with Primary Care Provider     Starting 06/08/2018, I will continue to take medications as prescribed and to keep appointments  with PCP as scheduled.     . Patient Stated     06/13/2019, I will maintain and continue medications as prescribed.        Fall Risk Fall Risk  06/13/2019 06/08/2018 05/28/2017 05/27/2016 05/13/2016  Falls in the past year? 0 0 No No No  Number falls in past yr: 0 - - - -  Injury with Fall? 0 - - - -  Risk for fall due to : Medication side effect - - - -  Follow up Falls evaluation completed;Falls prevention discussed - - - -   Is the patient's home free of loose throw rugs in walkways, pet beds, electrical cords, etc?   yes      Grab bars in the bathroom? no      Handrails on the stairs?   no      Adequate lighting?   yes  Timed Get Up and Go performed: N/A  Depression Screen PHQ 2/9 Scores 06/13/2019 06/08/2018 05/28/2017 05/27/2016  PHQ - 2 Score 0 0 0 0  PHQ- 9 Score 0 0 0 -     Cognitive Function MMSE - Mini Mental State Exam 06/13/2019 06/08/2018 05/28/2017 05/27/2016  Orientation to time 5 5 5 5   Orientation to Place 5 5 5 5   Registration 3 3 3 3   Attention/ Calculation 5 0 0 0  Recall 3 3 3 3   Language- name 2 objects - 0 0 0  Language- repeat 1 1 1 1   Language- follow 3 step command - 3 3 3   Language- read & follow direction - 0 0 0  Write a sentence - 0 0 0  Copy design - 0 0 0  Total score - 20 20 20   Mini Cog  Mini-Cog screen was completed.  Maximum score is 22. A value of 0 denotes this part of the MMSE was not completed or the patient failed this part of the Mini-Cog screening.       Immunization History  Administered Date(s) Administered  . Fluad Quad(high Dose 65+) 01/21/2019  . Influenza,inj,Quad PF,6+ Mos 01/09/2014, 04/04/2015, 01/09/2016, 02/10/2017, 03/11/2018  . PFIZER SARS-COV-2 Vaccination 06/12/2019  . Pneumococcal Conjugate-13 04/04/2015  . Pneumococcal Polysaccharide-23 09/30/2013  . Td 10/06/2007  . Varicella Zoster Immune Globulin 09/24/2010  . Zoster 11/24/2010    Qualifies for Shingles Vaccine?Yes  Screening Tests Health Maintenance  Topic Date Due  . Fecal DNA (Cologuard)  06/11/2019  . TETANUS/TDAP  04/20/2020 (Originally 10/05/2017)  . MAMMOGRAM  09/26/2020  . INFLUENZA VACCINE  Completed  . DEXA SCAN  Completed  . Hepatitis C Screening  Completed  . PNA vac Low Risk Adult  Completed    Cancer Screenings: Lung: Low Dose CT Chest recommended if Age 51-80 years, 30 pack-year currently smoking OR have quit w/in 15years. Patient does not qualify. Breast:  Up to date on Mammogram? Yes, completed 09/27/2018   Up to date of Bone Density/Dexa? Yes, completed 06/25/2017 Colorectal: completed 12/12/2013  Additional Screenings:  Hepatitis C Screening: 05/13/2016     Plan:   Patient will maintain and continue medications as prescribed.   I have personally reviewed and noted the following in the patient's chart:   . Medical and social history . Use of alcohol, tobacco or illicit drugs  . Current medications and supplements . Functional ability and status . Nutritional status . Physical activity . Advanced directives . List of other physicians . Hospitalizations, surgeries, and ER visits in previous 12 months . Vitals . Screenings to include  cognitive, depression, and falls . Referrals and appointments  In addition, I have reviewed and discussed with patient certain preventive protocols,  quality metrics, and best practice recommendations. A written personalized care plan for preventive services as well as general preventive health recommendations were provided to patient.     Janalyn Shy, LPN  [id number]

## 2019-06-13 NOTE — Progress Notes (Signed)
PCP notes:  Health Maintenance: No gaps noted   Abnormal Screenings: none   Patient concerns: Rash on legs, Dermatology recommended vitamin c daily wants Dr. Milinda Antis opinion   Nurse concerns: none   Next PCP appt.: 06/17/2019 @ 11:30 am

## 2019-06-14 LAB — LIPID PANEL
Cholesterol: 166 mg/dL (ref 0–200)
HDL: 49.9 mg/dL (ref >39.00)
LDL Cholesterol: 91 mg/dL (ref 0–99)
NonHDL: 115.61
Total CHOL/HDL Ratio: 3
Triglycerides: 125 mg/dL (ref 0.0–149.0)
VLDL: 25 mg/dL (ref 0.0–40.0)

## 2019-06-15 ENCOUNTER — Encounter: Payer: Medicare Other | Admitting: Family Medicine

## 2019-06-17 ENCOUNTER — Ambulatory Visit (INDEPENDENT_AMBULATORY_CARE_PROVIDER_SITE_OTHER): Payer: Medicare Other | Admitting: Family Medicine

## 2019-06-17 ENCOUNTER — Other Ambulatory Visit: Payer: Self-pay

## 2019-06-17 ENCOUNTER — Encounter: Payer: Self-pay | Admitting: Family Medicine

## 2019-06-17 VITALS — BP 112/68 | HR 81 | Temp 97.5°F | Ht 64.75 in | Wt 141.4 lb

## 2019-06-17 DIAGNOSIS — E2839 Other primary ovarian failure: Secondary | ICD-10-CM

## 2019-06-17 DIAGNOSIS — Z0000 Encounter for general adult medical examination without abnormal findings: Secondary | ICD-10-CM

## 2019-06-17 DIAGNOSIS — I10 Essential (primary) hypertension: Secondary | ICD-10-CM | POA: Diagnosis not present

## 2019-06-17 DIAGNOSIS — E559 Vitamin D deficiency, unspecified: Secondary | ICD-10-CM

## 2019-06-17 DIAGNOSIS — M8589 Other specified disorders of bone density and structure, multiple sites: Secondary | ICD-10-CM

## 2019-06-17 DIAGNOSIS — E782 Mixed hyperlipidemia: Secondary | ICD-10-CM

## 2019-06-17 MED ORDER — SIMVASTATIN 10 MG PO TABS: 10.0000 mg | ORAL_TABLET | Freq: Every day | ORAL | 3 refills | Status: DC

## 2019-06-17 MED ORDER — LISINOPRIL-HYDROCHLOROTHIAZIDE 20-12.5 MG PO TABS: 0.5000 | ORAL_TABLET | Freq: Every day | ORAL | 3 refills | Status: DC

## 2019-06-17 NOTE — Assessment & Plan Note (Signed)
Reviewed health habits including diet and exercise and skin cancer prevention Reviewed appropriate screening tests for age  Also reviewed health mt list, fam hx and immunization status , as well as social and family history   See HPI amw reviewed  Labs reviewed  dexa ordered Pt declines another cologuard kit  Enc more structured exercise

## 2019-06-17 NOTE — Progress Notes (Signed)
Subjective:    Patient ID: Colleen Cochran, female    DOB: [birth year], 74 y.o.   MRN: [id number]  This visit occurred during the SARS-CoV-2 public health emergency.  Safety protocols were in place, including screening questions prior to the visit, additional usage of staff PPE, and extensive cleaning of exam room while observing appropriate contact time as indicated for disinfecting solutions.    HPI Here for health maintenance exam and to review chronic medical problems    Wt Readings from Last 3 Encounters:  06/17/19 141 lb 7 oz (64.2 kg)  06/13/19 145 lb (65.8 kg)  06/14/18 140 lb 5 oz (63.6 kg)  taking care of herself  23.72 kg/m   Wants to exercise more  Not doing a lot now - walks to the mailbox    Had amw on 2/22 No gaps   cologuard 2/18 negative (declines another for now)  Colonoscopy 8/15- 10 y recall (past polyps)  Mammogram 6/20 Self breast exam - no lumps or changes   dexa 3/19 -osteopenia-wants to set up Has lost some ht-noticed  Falls -none Fractures-none  Supplements D level 50.3 Exercise -walks to mailbox/not enough   Had zostavax 8/12  Had first covid vaccine 2/21 Next one is planned 07/06/19   bp is stable today  No cp or palpitations or headaches or edema  No side effects to medicines  BP Readings from Last 3 Encounters:  06/17/19 112/68  06/13/19 111/67  06/14/18 128/76     Pulse Readings from Last 3 Encounters:  06/17/19 81  06/14/18 95  06/08/18 98     Hyperlipidemia Lab Results  Component Value Date   CHOL 166 06/13/2019   CHOL 144 06/08/2018   CHOL 170 05/28/2017   Lab Results  Component Value Date   HDL 49.90 06/13/2019   HDL 45.50 06/08/2018   HDL 47.20 05/28/2017   Lab Results  Component Value Date   LDLCALC 91 06/13/2019   LDLCALC 77 06/08/2018   LDLCALC 102 (H) 05/28/2017   Lab Results  Component Value Date   TRIG 125.0 06/13/2019   TRIG 104.0 06/08/2018   TRIG 104.0 05/28/2017   Lab Results  Component  Value Date   CHOLHDL 3 06/13/2019   CHOLHDL 3 06/08/2018   CHOLHDL 4 05/28/2017   Lab Results  Component Value Date   LDLDIRECT 179.6 03/02/2009   Simvastatin and diet  Diet is fair-she makes and effort to avoid fatty foods  Lots of veggies and salads   Lab Results  Component Value Date   WBC 5.4 06/13/2019   HGB 13.3 06/13/2019   HCT 39.5 06/13/2019   MCV 89.7 06/13/2019   PLT 341.0 06/13/2019   Lab Results  Component Value Date   TSH 1.21 06/13/2019   Lab Results  Component Value Date   CREATININE 0.74 06/13/2019   BUN 15 06/13/2019   NA 142 06/13/2019   K 4.1 06/13/2019   CL 104 06/13/2019   CO2 27 06/13/2019   Lab Results  Component Value Date   ALT 17 06/13/2019   AST 18 06/13/2019   ALKPHOS 61 06/13/2019   BILITOT 0.6 06/13/2019   Patient Active Problem List   Diagnosis Date Noted  . Screening mammogram, encounter for 06/03/2017  . Need for hepatitis C screening test 05/03/2016  . Second hand smoke exposure 01/09/2016  . Estrogen deficiency 04/04/2015  . Encounter for Medicare annual wellness exam 09/30/2013  . Family history of aortic aneurysm 09/30/2013  . Other screening mammogram  11/24/2011  . Routine general medical examination at a health care facility 11/17/2011  . Hypertension 09/24/2010  . POSTMENOPAUSAL STATUS 06/24/2010  . Vitamin D deficiency 03/02/2009  . History of colon polyps - adenomas 12/17/2007  . Hyperlipidemia 10/05/2007  . SINUSITIS, RECURRENT 10/05/2007  . ALLERGIC RHINITIS 10/05/2007  . Osteopenia 10/05/2007  . NEPHROLITHIASIS, HX OF 10/05/2007   Past Medical History:  Diagnosis Date  . Allergy    allergic rhinitis  . Arthritis   . History of colon polyps - adenomas 12/17/2007  . Hyperlipidemia   . Hypertension   . Kidney stone   . Nephrolithiasis    Hx of  . Osteopenia    Past Surgical History:  Procedure Laterality Date  . ABDOMINAL HYSTERECTOMY  1980   total for bleeding  . [place]  .  COLONOSCOPY    . DILATION AND CURETTAGE OF UTERUS     x2   Social History   Tobacco Use  . Smoking status: Passive Smoke Exposure - Never Smoker  . Smokeless tobacco: Never Used  . Tobacco comment: husband smokes  Substance Use Topics  . Alcohol use: No    Alcohol/week: 0.0 standard drinks  . Drug use: No   Family History  Problem Relation Age of Onset  . Hypertension Mother   . Osteoporosis Mother   . Heart disease Father        CABG,CAD,MI  . Kidney failure Father   . Osteoporosis Father        ? of OP pt had hip fx.  . Hypertension Brother   . Colon cancer Neg Hx   . Pancreatic cancer Neg Hx   . Rectal cancer Neg Hx   . Stomach cancer Neg Hx   . Breast cancer Neg Hx    No Known Allergies Current Outpatient Medications on File Prior to Visit  Medication Sig Dispense Refill  . albuterol (PROVENTIL HFA;VENTOLIN HFA) 108 (90 Base) MCG/ACT inhaler INHALE 1 TO 2 PUFFS BY MOUTH INTO THE LUNGS EVERY 4 HOURS 18 g 5  . aspirin 81 MG tablet Take 81 mg by mouth daily.    . cholecalciferol (VITAMIN D) 1000 UNITS tablet Take 1,000 Units by mouth daily.       No current facility-administered medications on file prior to visit.     Review of Systems  Constitutional: Negative for activity change, appetite change, fatigue, fever and unexpected weight change.  HENT: Negative for congestion, ear pain, rhinorrhea, sinus pressure and sore throat.   Eyes: Negative for pain, redness and visual disturbance.  Respiratory: Negative for cough, shortness of breath and wheezing.   Cardiovascular: Negative for chest pain and palpitations.  Gastrointestinal: Negative for abdominal pain, blood in stool, constipation and diarrhea.  Endocrine: Negative for polydipsia and polyuria.  Genitourinary: Negative for dysuria, frequency and urgency.  Musculoskeletal: Negative for arthralgias, back pain and myalgias.  Skin: Negative for pallor and rash.  Allergic/Immunologic: Negative for environmental  allergies.  Neurological: Negative for dizziness, syncope and headaches.  Hematological: Negative for adenopathy. Does not bruise/bleed easily.  Psychiatric/Behavioral: Negative for decreased concentration and dysphoric mood. The patient is not nervous/anxious.        Objective:   Physical Exam Constitutional:      General: She is not in acute distress.    Appearance: Normal appearance. She is well-developed and normal weight. She is not ill-appearing or diaphoretic.  HENT:     Head: Normocephalic and atraumatic.     Right Ear: Tympanic membrane,  ear canal and external ear normal.     Left Ear: Tympanic membrane, ear canal and external ear normal.     Nose: Nose normal. No congestion.     Mouth/Throat:     Mouth: Mucous membranes are moist.     Pharynx: Oropharynx is clear. No posterior oropharyngeal erythema.  Eyes:     General: No scleral icterus.    Extraocular Movements: Extraocular movements intact.     Conjunctiva/sclera: Conjunctivae normal.     Pupils: Pupils are equal, round, and reactive to light.  Neck:     Thyroid: No thyromegaly.     Vascular: No carotid bruit or JVD.  Cardiovascular:     Rate and Rhythm: Normal rate and regular rhythm.     Pulses: Normal pulses.     Heart sounds: Normal heart sounds. No gallop.   Pulmonary:     Effort: Pulmonary effort is normal. No respiratory distress.     Breath sounds: Normal breath sounds. No wheezing.     Comments: Good air exch Chest:     Chest wall: No tenderness.  Abdominal:     General: Bowel sounds are normal. There is no distension or abdominal bruit.     Palpations: Abdomen is soft. There is no mass.     Tenderness: There is no abdominal tenderness.     Hernia: No hernia is present.  Genitourinary:    Comments: Breast exam: No mass, nodules, thickening, tenderness, bulging, retraction, inflamation, nipple discharge or skin changes noted.  No axillary or clavicular LA.     Musculoskeletal:        General: No  tenderness or signs of injury. Normal range of motion.     Cervical back: Normal range of motion and neck supple. No rigidity. No muscular tenderness.     Right lower leg: No edema.     Left lower leg: No edema.     Comments: No kyphosis   Lymphadenopathy:     Cervical: No cervical adenopathy.  Skin:    General: Skin is warm and dry.     Coloration: Skin is not pale.     Findings: No erythema or rash.     Comments: Some speckled areas of hyperpigmentation on shins (tx by dermatology)   Solar lentigines diffusely Some sks  Neurological:     Mental Status: She is alert. Mental status is at baseline.     Cranial Nerves: No cranial nerve deficit.     Motor: No abnormal muscle tone.     Coordination: Coordination normal.     Gait: Gait normal.     Deep Tendon Reflexes: Reflexes are normal and symmetric. Reflexes normal.  Psychiatric:        Mood and Affect: Mood normal.        Cognition and Memory: Cognition and memory normal.           Assessment & Plan:   Problem List Items Addressed This Visit      Cardiovascular and Mediastinum   Hypertension    bp in fair control at this time  BP Readings from Last 1 Encounters:  06/17/19 112/68   No changes needed Most recent labs reviewed  Disc lifstyle change with low sodium diet and exercise        Relevant Medications   simvastatin (ZOCOR) 10 MG tablet   lisinopril-hydrochlorothiazide (ZESTORETIC) 20-12.5 MG tablet     Musculoskeletal and Integument   Osteopenia    Due for 2 y f/u dexa next month, this  was ordered No falls or fractures On ca and D Enc more exercise  D level 50.3         Other   Vitamin D deficiency    Vitamin D level is therapeutic with current supplementation Disc importance of this to bone and overall health Level of 50.3      Hyperlipidemia    Disc goals for lipids and reasons to control them Rev last labs with pt Rev low sat fat diet in detail  Well controlled with simvastatin and diet         Relevant Medications   simvastatin (ZOCOR) 10 MG tablet   lisinopril-hydrochlorothiazide (ZESTORETIC) 20-12.5 MG tablet   Routine general medical examination at a health care facility - Primary    Reviewed health habits including diet and exercise and skin cancer prevention Reviewed appropriate screening tests for age  Also reviewed health mt list, fam hx and immunization status , as well as social and family history   See HPI amw reviewed  Labs reviewed  dexa ordered Pt declines another cologuard kit  Enc more structured exercise       Estrogen deficiency   Relevant Orders   DG Bone Density

## 2019-06-17 NOTE — Assessment & Plan Note (Signed)
Vitamin D level is therapeutic with current supplementation Disc importance of this to bone and overall health Level of 50.3

## 2019-06-17 NOTE — Assessment & Plan Note (Signed)
Disc goals for lipids and reasons to control them Rev last labs with pt Rev low sat fat diet in detail Well controlled with simvastatin and diet  

## 2019-06-17 NOTE — Patient Instructions (Signed)
Work on exercise - indoor videos are good  Get outdoors when you can and walk   Take care of yourself  Keep up a healthy diet   No change in medicine    Schedule your bone density test when convenient

## 2019-06-17 NOTE — Assessment & Plan Note (Signed)
Due for 2 y f/u dexa next month, this was ordered No falls or fractures On ca and D Enc more exercise  D level 50.3

## 2019-06-17 NOTE — Assessment & Plan Note (Signed)
bp in fair control at this time  BP Readings from Last 1 Encounters:  06/17/19 112/68   No changes needed Most recent labs reviewed  Disc lifstyle change with low sodium diet and exercise

## 2019-07-06 ENCOUNTER — Ambulatory Visit: Payer: Medicare Other | Attending: Internal Medicine

## 2019-07-06 DIAGNOSIS — Z23 Encounter for immunization: Secondary | ICD-10-CM

## 2019-07-06 NOTE — Progress Notes (Signed)
   Covid-19 Vaccination Clinic  Name:  Colleen Cochran    MRN: [id number] DOB: 1945/05/20  07/06/2019  [name] was observed post Covid-19 immunization for 15 minutes without incident. She was provided with Vaccine Information Sheet and instruction to access the V-Safe system.   [name] was instructed to call 911 with any severe reactions post vaccine: Marland Kitchen Difficulty breathing  . Swelling of face and throat  . A fast heartbeat  . A bad rash all over body  . Dizziness and weakness   Immunizations Administered    Name Date Dose VIS Date Route   Pfizer COVID-19 Vaccine 07/06/2019  8:15 AM 0.3 mL 04/01/2019 Intramuscular   Manufacturer: [employer], [employer]   Lot: [id number]   NDC: [address]-3

## 2019-08-11 ENCOUNTER — Ambulatory Visit
Admission: RE | Admit: 2019-08-11 | Discharge: 2019-08-11 | Disposition: A | Discharge status: Home / Self Care | Payer: Medicare Other | Source: Ambulatory Visit | Attending: Family Medicine | Admitting: Family Medicine

## 2019-08-11 DIAGNOSIS — E2839 Other primary ovarian failure: Secondary | ICD-10-CM | POA: Diagnosis present

## 2019-08-19 ENCOUNTER — Encounter: Payer: Self-pay | Admitting: Family Medicine

## 2019-08-19 ENCOUNTER — Other Ambulatory Visit: Payer: Self-pay

## 2019-08-19 ENCOUNTER — Ambulatory Visit (INDEPENDENT_AMBULATORY_CARE_PROVIDER_SITE_OTHER): Payer: Medicare Other | Admitting: Family Medicine

## 2019-08-19 DIAGNOSIS — M8589 Other specified disorders of bone density and structure, multiple sites: Secondary | ICD-10-CM | POA: Diagnosis not present

## 2019-08-19 MED ORDER — ALENDRONATE SODIUM 70 MG PO TABS: 70.0000 mg | ORAL_TABLET | ORAL | 11 refills | Status: DC

## 2019-08-19 NOTE — Assessment & Plan Note (Signed)
Reviewed recent dexa with TS of -2.4 in LS - had dec in radius  Takes vit D and level is tx fam hx OP  No falls or fractures  Counseled on exercise-will start weight bearing  No high risk medications  Alendronate would be a good option for treatment -given handout and px to start if she wants to  Would plan 5 y tx Disc poss side eff and appropriate way to take Disc ON jaw risk (no dental issues)

## 2019-08-19 NOTE — Patient Instructions (Signed)
Here is info on alendronate (fosamax) and a px if you want to start  We would treat for 5 years   Exercise - weight bearing is best  Continue vitamin D  Eat a balanced diet

## 2019-08-19 NOTE — Progress Notes (Signed)
Subjective:    Patient ID: Colleen Cochran, female    DOB: 04-Aug-1945, 74 y.o.   MRN: [id number]  This visit occurred during the SARS-CoV-2 public health emergency.  Safety protocols were in place, including screening questions prior to the visit, additional usage of staff PPE, and extensive cleaning of exam room while observing appropriate contact time as indicated for disinfecting solutions.    HPI Pt presents to discuss bone density   Wt Readings from Last 3 Encounters:  08/19/19 142 lb 9 oz (64.7 kg)  06/17/19 141 lb 7 oz (64.2 kg)  06/13/19 145 lb (65.8 kg)   23.91 kg/m    Diagnosed with osteopenia  Recent dexa T score LS -2.4 (was 2.3) Dual femoral neck -2.4 (was -2.1) Left forearm -2.2 (was -1.3)   frax calculations  10 y prob of hip fx 36.8% Overall 18.4 %  Mother had osteoporosis  Pt never smoked but has had passive exp   Falls - none in past 12 mo  Fractures - fx leg in 1980s  (none since)  Supplements -takes vit D 1000 iu daily   (no ca due to gi intol)  Eats ca rich foods  Vit D level last was 50.38  Exercise - not enough/none right now    [facility] as part of regular activity   No ppi or seizure medicine No thyroid medicine   No GI problems or indigestion   Patient Active Problem List   Diagnosis Date Noted  . Screening mammogram, encounter for 06/03/2017  . Need for hepatitis C screening test 05/03/2016  . Second hand smoke exposure 01/09/2016  . Estrogen deficiency 04/04/2015  . Encounter for Medicare annual wellness exam 09/30/2013  . Family history of aortic aneurysm 09/30/2013  . Other screening mammogram 11/24/2011  . Routine general medical examination at a health care facility 11/17/2011  . Hypertension 09/24/2010  . POSTMENOPAUSAL STATUS 06/24/2010  . Vitamin D deficiency 03/02/2009  . History of colon polyps - adenomas 12/17/2007  . Hyperlipidemia 10/05/2007  . SINUSITIS, RECURRENT 10/05/2007  . ALLERGIC RHINITIS 10/05/2007  .  Osteopenia 10/05/2007  . NEPHROLITHIASIS, HX OF 10/05/2007   Past Medical History:  Diagnosis Date  . Allergy    allergic rhinitis  . Arthritis   . History of colon polyps - adenomas 12/17/2007  . Hyperlipidemia   . Hypertension   . Kidney stone   . Nephrolithiasis    Hx of  . Osteopenia    Past Surgical History:  Procedure Laterality Date  . ABDOMINAL HYSTERECTOMY  1980   total for bleeding  . CESAREAN SECTION  1969  . COLONOSCOPY    . DILATION AND CURETTAGE OF UTERUS     x2   Social History   Tobacco Use  . Smoking status: Passive Smoke Exposure - Never Smoker  . Smokeless tobacco: Never Used  . Tobacco comment: husband smokes  Substance Use Topics  . Alcohol use: No    Alcohol/week: 0.0 standard drinks  . Drug use: No   Family History  Problem Relation Age of Onset  . Hypertension Mother   . Osteoporosis Mother   . Heart disease Father        CABG,CAD,MI  . Kidney failure Father   . Osteoporosis Father        ? of OP pt had hip fx.  . Hypertension Brother   . Colon cancer Neg Hx   . Pancreatic cancer Neg Hx   . Rectal cancer Neg Hx   .  Stomach cancer Neg Hx   . Breast cancer Neg Hx    No Known Allergies Current Outpatient Medications on File Prior to Visit  Medication Sig Dispense Refill  . albuterol (PROVENTIL HFA;VENTOLIN HFA) 108 (90 Base) MCG/ACT inhaler INHALE 1 TO 2 PUFFS BY MOUTH INTO THE LUNGS EVERY 4 HOURS 18 g 5  . aspirin 81 MG tablet Take 81 mg by mouth daily.    . cholecalciferol (VITAMIN D) 1000 UNITS tablet Take 1,000 Units by mouth daily.      Marland Kitchen lisinopril-hydrochlorothiazide (ZESTORETIC) 20-12.5 MG tablet Take 0.5 tablets by mouth daily. 45 tablet 3  . simvastatin (ZOCOR) 10 MG tablet Take 1 tablet (10 mg total) by mouth daily. 90 tablet 3   No current facility-administered medications on file prior to visit.    Review of Systems  Constitutional: Negative for activity change, appetite change, fatigue, fever and unexpected weight  change.  HENT: Negative for congestion, ear pain, rhinorrhea, sinus pressure and sore throat.   Eyes: Negative for pain, redness and visual disturbance.  Respiratory: Negative for cough, shortness of breath and wheezing.   Cardiovascular: Negative for chest pain and palpitations.  Gastrointestinal: Negative for abdominal pain, blood in stool, constipation and diarrhea.  Endocrine: Negative for polydipsia and polyuria.  Genitourinary: Negative for dysuria, frequency and urgency.  Musculoskeletal: Negative for arthralgias, back pain and myalgias.  Skin: Negative for pallor and rash.  Allergic/Immunologic: Negative for environmental allergies.  Neurological: Negative for dizziness, syncope and headaches.  Hematological: Negative for adenopathy. Does not bruise/bleed easily.  Psychiatric/Behavioral: Negative for decreased concentration and dysphoric mood. The patient is not nervous/anxious.        Objective:   Physical Exam Constitutional:      General: She is not in acute distress.    Appearance: Normal appearance. She is normal weight. She is not ill-appearing.  HENT:     Head: Normocephalic and atraumatic.  Cardiovascular:     Rate and Rhythm: Normal rate and regular rhythm.  Musculoskeletal:     Cervical back: Normal range of motion.     Right lower leg: No edema.     Left lower leg: No edema.     Comments: No kyphosis Posture is good  Frame is medium   Skin:    Coloration: Skin is not jaundiced or pale.  Neurological:     Mental Status: She is alert.  Psychiatric:        Mood and Affect: Mood normal.           Assessment & Plan:   Problem List Items Addressed This Visit      Musculoskeletal and Integument   Osteopenia    Reviewed recent dexa with TS of -2.4 in LS - had dec in radius  Takes vit D and level is tx fam hx OP  No falls or fractures  Counseled on exercise-will start weight bearing  No high risk medications  Alendronate would be a good option for  treatment -given handout and px to start if she wants to  Would plan 5 y tx Disc poss side eff and appropriate way to take Disc ON jaw risk (no dental issues)

## 2020-06-03 ENCOUNTER — Telehealth: Payer: Self-pay | Admitting: Family Medicine

## 2020-06-04 MED ORDER — SIMVASTATIN 10 MG PO TABS: 10.0000 mg | ORAL_TABLET | Freq: Every day | ORAL | 0 refills | Status: DC

## 2020-06-04 NOTE — Telephone Encounter (Signed)
Patient stated that she is in [facility] and does not think she will have enough medication to last until she can return to [facility]. Patient is asking this to be sent to the [facility] in [address], [place]. Will send in a 30 day supply of this medication.

## 2020-06-04 NOTE — Addendum Note (Signed)
Addended by: Karenann Cai on: 06/04/2020 02:23 PM   Modules accepted: Orders

## 2020-06-04 NOTE — Telephone Encounter (Signed)
Pharmacy requests refill on: Simvastatin 10 mg   LAST REFILL: 06/17/2019 (Q-90, R-3) LAST OV: 08/19/2019 NEXT OV: Not Scheduled  PHARMACY: [facility] [address] [place]

## 2020-06-04 NOTE — Telephone Encounter (Signed)
Pt called to check status on refill. She received a notification stating it had been denied. States they are in [place] due to a family emergency, so will need to call cell phone.

## 2020-07-09 ENCOUNTER — Telehealth: Payer: Self-pay | Admitting: Family Medicine

## 2020-07-09 NOTE — Telephone Encounter (Signed)
Med refilled.

## 2020-08-15 ENCOUNTER — Telehealth: Payer: Self-pay | Admitting: Family Medicine

## 2020-08-15 DIAGNOSIS — Z0000 Encounter for general adult medical examination without abnormal findings: Secondary | ICD-10-CM

## 2020-08-15 DIAGNOSIS — I10 Essential (primary) hypertension: Secondary | ICD-10-CM

## 2020-08-15 DIAGNOSIS — E559 Vitamin D deficiency, unspecified: Secondary | ICD-10-CM

## 2020-08-15 DIAGNOSIS — E782 Mixed hyperlipidemia: Secondary | ICD-10-CM

## 2020-08-15 NOTE — Telephone Encounter (Signed)
-----   Message from Aquilla Solian, RT sent at 07/31/2020  1:27 PM EDT ----- Regarding: Lab Orders for Thursday 4.28.2022 Please place lab orders for Thursday 4.28.2022, office visit for physical on Thursday 5.5.2022 Thank you, Jones Bales RT(R)

## 2020-08-16 ENCOUNTER — Other Ambulatory Visit: Payer: Self-pay

## 2020-08-16 ENCOUNTER — Other Ambulatory Visit (INDEPENDENT_AMBULATORY_CARE_PROVIDER_SITE_OTHER): Payer: Medicare Other

## 2020-08-16 DIAGNOSIS — E559 Vitamin D deficiency, unspecified: Secondary | ICD-10-CM | POA: Diagnosis not present

## 2020-08-16 DIAGNOSIS — I10 Essential (primary) hypertension: Secondary | ICD-10-CM | POA: Diagnosis not present

## 2020-08-16 DIAGNOSIS — E782 Mixed hyperlipidemia: Secondary | ICD-10-CM | POA: Diagnosis not present

## 2020-08-16 LAB — CBC WITH DIFFERENTIAL/PLATELET
Basophils Absolute: 0 10*3/uL (ref 0.0–0.1)
Basophils Relative: 0.8 % (ref 0.0–3.0)
Eosinophils Absolute: 0.1 10*3/uL (ref 0.0–0.7)
Eosinophils Relative: 2.9 % (ref 0.0–5.0)
HCT: 39 % (ref 36.0–46.0)
Hemoglobin: 13.3 g/dL (ref 12.0–15.0)
Lymphocytes Relative: 26.4 % (ref 12.0–46.0)
Lymphs Abs: 1.4 10*3/uL (ref 0.7–4.0)
MCHC: 34 g/dL (ref 30.0–36.0)
MCV: 88.7 fl (ref 78.0–100.0)
Monocytes Absolute: 0.5 10*3/uL (ref 0.1–1.0)
Monocytes Relative: 10.1 % (ref 3.0–12.0)
Neutro Abs: 3.1 10*3/uL (ref 1.4–7.7)
Neutrophils Relative %: 59.8 % (ref 43.0–77.0)
Platelets: 336 10*3/uL (ref 150.0–400.0)
RBC: 4.4 Mil/uL (ref 3.87–5.11)
RDW: 13.5 % (ref 11.5–15.5)
WBC: 5.2 10*3/uL (ref 4.0–10.5)

## 2020-08-16 LAB — COMPREHENSIVE METABOLIC PANEL
ALT: 17 U/L (ref 0–35)
AST: 19 U/L (ref 0–37)
Albumin: 4.3 g/dL (ref 3.5–5.2)
Alkaline Phosphatase: 62 U/L (ref 39–117)
BUN: 15 mg/dL (ref 6–23)
CO2: 29 mEq/L (ref 19–32)
Calcium: 9.6 mg/dL (ref 8.4–10.5)
Chloride: 104 mEq/L (ref 96–112)
Creatinine, Ser: 0.64 mg/dL (ref 0.40–1.20)
GFR: 87.53 mL/min (ref >60.00)
Glucose, Bld: 93 mg/dL (ref 70–99)
Potassium: 3.9 mEq/L (ref 3.5–5.1)
Sodium: 141 mEq/L (ref 135–145)
Total Bilirubin: 0.5 mg/dL (ref 0.2–1.2)
Total Protein: 7.3 g/dL (ref 6.0–8.3)

## 2020-08-16 LAB — LIPID PANEL
Cholesterol: 150 mg/dL (ref 0–200)
HDL: 41.7 mg/dL (ref >39.00)
LDL Cholesterol: 85 mg/dL (ref 0–99)
NonHDL: 108.7
Total CHOL/HDL Ratio: 4
Triglycerides: 119 mg/dL (ref 0.0–149.0)
VLDL: 23.8 mg/dL (ref 0.0–40.0)

## 2020-08-16 LAB — VITAMIN D 25 HYDROXY (VIT D DEFICIENCY, FRACTURES): VITD: 41.27 ng/mL (ref 30.00–100.00)

## 2020-08-16 LAB — TSH: TSH: 1.02 u[IU]/mL (ref 0.35–4.50)

## 2020-08-23 ENCOUNTER — Ambulatory Visit (INDEPENDENT_AMBULATORY_CARE_PROVIDER_SITE_OTHER): Payer: Medicare Other | Admitting: Family Medicine

## 2020-08-23 ENCOUNTER — Encounter: Payer: Self-pay | Admitting: Family Medicine

## 2020-08-23 ENCOUNTER — Other Ambulatory Visit: Payer: Self-pay

## 2020-08-23 VITALS — BP 122/70 | HR 94 | Temp 95.6°F | Ht 65.5 in | Wt 149.0 lb

## 2020-08-23 DIAGNOSIS — I10 Essential (primary) hypertension: Secondary | ICD-10-CM | POA: Diagnosis not present

## 2020-08-23 DIAGNOSIS — M8589 Other specified disorders of bone density and structure, multiple sites: Secondary | ICD-10-CM

## 2020-08-23 DIAGNOSIS — E782 Mixed hyperlipidemia: Secondary | ICD-10-CM | POA: Diagnosis not present

## 2020-08-23 DIAGNOSIS — Z0000 Encounter for general adult medical examination without abnormal findings: Secondary | ICD-10-CM

## 2020-08-23 DIAGNOSIS — E559 Vitamin D deficiency, unspecified: Secondary | ICD-10-CM

## 2020-08-23 MED ORDER — SIMVASTATIN 10 MG PO TABS: ORAL_TABLET | ORAL | 11 refills | Status: DC

## 2020-08-23 MED ORDER — LISINOPRIL-HYDROCHLOROTHIAZIDE 20-12.5 MG PO TABS: 0.5000 | ORAL_TABLET | Freq: Every day | ORAL | 11 refills | Status: DC

## 2020-08-23 NOTE — Assessment & Plan Note (Signed)
bp in fair control at this time  BP Readings from Last 1 Encounters:  08/23/20 122/70   No changes needed Most recent labs reviewed  Disc lifstyle change with low sodium diet and exercise  Plan to continue lisinopril hct 20-12.5 mg one half pill daily

## 2020-08-23 NOTE — Assessment & Plan Note (Signed)
Reviewed health habits including diet and exercise and skin cancer prevention Reviewed appropriate screening tests for age  Also reviewed health mt list, fam hx and immunization status , as well as social and family history   See HPI Labs reviewed  Signed up for cologuard (did this 3 y ago) Given info to schedule her mammogram  Interested in shingrix vaccine if covered dexa utd, enc her to start the alendronate, no falls or fx and D level is tx Enc more exercise  Given materials to work on advance directive No cognitive concerns Nl hearing screen and vision screen

## 2020-08-23 NOTE — Assessment & Plan Note (Signed)
D level of 41 Vitamin D level is therapeutic with current supplementation Disc importance of this to bone and overall health Will continue current supplementation

## 2020-08-23 NOTE — Progress Notes (Signed)
Subjective:    Patient ID: Colleen Cochran, female    DOB: 1945/08/17, 75 y.o.   MRN: [id number]  This visit occurred during the SARS-CoV-2 public health emergency.  Safety protocols were in place, including screening questions prior to the visit, additional usage of staff PPE, and extensive cleaning of exam room while observing appropriate contact time as indicated for disinfecting solutions.    HPI Pt presents for amw and health mt visit  I have personally reviewed the Medicare Annual Wellness questionnaire and have noted 1. The patient's medical and social history 2. Their use of alcohol, tobacco or illicit drugs 3. Their current medications and supplements 4. The patient's functional ability including ADL's, fall risks, home safety risks and hearing or visual             impairment. 5. Diet and physical activities 6. Evidence for depression or mood disorders  The patients weight, height, BMI have been recorded in the chart and visual acuity is per eye clinic.  I have made referrals, counseling and provided education to the patient based review of the above and I have provided the pt with a written personalized care plan for preventive services. Reviewed and updated provider list, see scanned forms.  See scanned forms.  Routine anticipatory guidance given to patient.  See health maintenance. Colon cancer screening cologuard 2/18    Due -wants to repeat  Breast cancer screening  Mammogram 6/20 -due for that  Self breast exam - no lumps  Flu vaccine 10/20  Tetanus vaccine 6/09 Pneumovax up to date Zoster vaccine zostavax 8/12- interested in shingrix if covered  covid status-immunized - and booster  Has not had covid  Dexa 4/21 osteopenia Takes px aldendronate- just for got to take , has not started yet  Falls- none  Fractures-none Supplements-vit D D level is 41.2 Exercise - none besides walk to mailbox /needs to do more   Advance directive- given materials to work on   Cognitive function addressed- see scanned forms- and if abnormal then additional documentation follows.   Memory is good  Handles her affairs  Not very forgetful  No accidents   PMH and SH reviewed  Meds, vitals, and allergies reviewed.   ROS: See HPI.  Otherwise negative.    Weight :  Wt Readings from Last 3 Encounters:  08/23/20 149 lb (67.6 kg)  08/19/19 142 lb 9 oz (64.7 kg)  06/17/19 141 lb 7 oz (64.2 kg)   24.42 kg/m  Busy  Staying well   Hearing/vision:  Hearing Screening   Method: Audiometry   125Hz  250Hz  500Hz  1000Hz  2000Hz  3000Hz  4000Hz  6000Hz  8000Hz   Right ear:   Pass Pass Pass  Pass    Left ear:   Pass Pass Pass  Pass      Visual Acuity Screening   Right eye Left eye Both eyes  Without correction:     With correction: 20/20 20/50 20/20      Due for eye /vision exam and plans to schedule Has a cataract the is ready to come out  Care team [facility]- derm [facility]- [facility]phy  HTN bp is stable today  No cp or palpitations or headaches or edema  No side effects to medicines  BP Readings from Last 3 Encounters:  08/23/20 122/70  08/19/19 132/76  06/17/19 112/68    Lisinopril hxt 20-12.5   1/2 pill daily   Hyperlipidemia Lab Results  Component Value Date   CHOL 150 08/16/2020   CHOL 166 06/13/2019  CHOL 144 06/08/2018   Lab Results  Component Value Date   HDL 41.70 08/16/2020   HDL 49.90 06/13/2019   HDL 45.50 06/08/2018   Lab Results  Component Value Date   LDLCALC 85 08/16/2020   LDLCALC 91 06/13/2019   LDLCALC 77 06/08/2018   Lab Results  Component Value Date   TRIG 119.0 08/16/2020   TRIG 125.0 06/13/2019   TRIG 104.0 06/08/2018   Lab Results  Component Value Date   CHOLHDL 4 08/16/2020   CHOLHDL 3 06/13/2019   CHOLHDL 3 06/08/2018   Lab Results  Component Value Date   LDLDIRECT 179.6 03/02/2009   Simvastatin 10 mg daily Tries to eat her veggies Some fatty foods   Other labs Results for orders placed or  performed in visit on 08/16/20  VITAMIN D 25 Hydroxy (Vit-D Deficiency, Fractures)  Result Value Ref Range   VITD 41.27 30.00 - 100.00 ng/mL  TSH  Result Value Ref Range   TSH 1.02 0.35 - 4.50 uIU/mL  Lipid panel  Result Value Ref Range   Cholesterol 150 0 - 200 mg/dL   Triglycerides 409.8119.0 0.0 - 149.0 mg/dL   HDL 11.9141.70 >47.82>39.00 mg/dL   VLDL 95.623.8 0.0 - 21.340.0 mg/dL   LDL Cholesterol 85 0 - 99 mg/dL   Total CHOL/HDL Ratio 4    NonHDL 108.70   Comprehensive metabolic panel  Result Value Ref Range   Sodium 141 135 - 145 mEq/L   Potassium 3.9 3.5 - 5.1 mEq/L   Chloride 104 96 - 112 mEq/L   CO2 29 19 - 32 mEq/L   Glucose, Bld 93 70 - 99 mg/dL   BUN 15 6 - 23 mg/dL   Creatinine, Ser 0.860.64 0.40 - 1.20 mg/dL   Total Bilirubin 0.5 0.2 - 1.2 mg/dL   Alkaline Phosphatase 62 39 - 117 U/L   AST 19 0 - 37 U/L   ALT 17 0 - 35 U/L   Total Protein 7.3 6.0 - 8.3 g/dL   Albumin 4.3 3.5 - 5.2 g/dL   GFR 57.8487.53 >69.62>60.00 mL/min   Calcium 9.6 8.4 - 10.5 mg/dL  CBC with Differential/Platelet  Result Value Ref Range   WBC 5.2 4.0 - 10.5 K/uL   RBC 4.40 3.87 - 5.11 Mil/uL   Hemoglobin 13.3 12.0 - 15.0 g/dL   HCT 95.239.0 84.136.0 - 32.446.0 %   MCV 88.7 78.0 - 100.0 fl   MCHC 34.0 30.0 - 36.0 g/dL   RDW 40.113.5 02.711.5 - 25.315.5 %   Platelets 336.0 150.0 - 400.0 K/uL   Neutrophils Relative % 59.8 43.0 - 77.0 %   Lymphocytes Relative 26.4 12.0 - 46.0 %   Monocytes Relative 10.1 3.0 - 12.0 %   Eosinophils Relative 2.9 0.0 - 5.0 %   Basophils Relative 0.8 0.0 - 3.0 %   Neutro Abs 3.1 1.4 - 7.7 K/uL   Lymphs Abs 1.4 0.7 - 4.0 K/uL   Monocytes Absolute 0.5 0.1 - 1.0 K/uL   Eosinophils Absolute 0.1 0.0 - 0.7 K/uL   Basophils Absolute 0.0 0.0 - 0.1 K/uL     Patient Active Problem List   Diagnosis Date Noted  . Screening mammogram, encounter for 06/03/2017  . Second hand smoke exposure 01/09/2016  . Estrogen deficiency 04/04/2015  . Encounter for Medicare annual wellness exam 09/30/2013  . Family history of aortic  aneurysm 09/30/2013  . Routine general medical examination at a health care facility 11/17/2011  . Hypertension 09/24/2010  . POSTMENOPAUSAL STATUS 06/24/2010  .  Vitamin D deficiency 03/02/2009  . History of colon polyps - adenomas 12/17/2007  . Hyperlipidemia 10/05/2007  . SINUSITIS, RECURRENT 10/05/2007  . ALLERGIC RHINITIS 10/05/2007  . Osteopenia 10/05/2007  . NEPHROLITHIASIS, HX OF 10/05/2007   Past Medical History:  Diagnosis Date  . Allergy    allergic rhinitis  . Arthritis   . History of colon polyps - adenomas 12/17/2007  . Hyperlipidemia   . Hypertension   . Kidney stone   . Nephrolithiasis    Hx of  . Osteopenia    Past Surgical History:  Procedure Laterality Date  . ABDOMINAL HYSTERECTOMY  1980   total for bleeding  . CESAREAN SECTION  1969  . COLONOSCOPY    . DILATION AND CURETTAGE OF UTERUS     x2   Social History   Tobacco Use  . Smoking status: Passive Smoke Exposure - Never Smoker  . Smokeless tobacco: Never Used  . Tobacco comment: husband smokes  Vaping Use  . Vaping Use: Never used  Substance Use Topics  . Alcohol use: No    Alcohol/week: 0.0 standard drinks  . Drug use: No   Family History  Problem Relation Age of Onset  . Hypertension Mother   . Osteoporosis Mother   . Heart disease Father        CABG,CAD,MI  . Kidney failure Father   . Osteoporosis Father        ? of OP pt had hip fx.  . Hypertension Brother   . Colon cancer Neg Hx   . Pancreatic cancer Neg Hx   . Rectal cancer Neg Hx   . Stomach cancer Neg Hx   . Breast cancer Neg Hx    No Known Allergies Current Outpatient Medications on File Prior to Visit  Medication Sig Dispense Refill  . albuterol (PROVENTIL HFA;VENTOLIN HFA) 108 (90 Base) MCG/ACT inhaler INHALE 1 TO 2 PUFFS BY MOUTH INTO THE LUNGS EVERY 4 HOURS 18 g 5  . aspirin 81 MG tablet Take 81 mg by mouth daily.    . cholecalciferol (VITAMIN D) 1000 UNITS tablet Take 1,000 Units by mouth daily.    Marland Kitchen alendronate  (FOSAMAX) 70 MG tablet Take 1 tablet (70 mg total) by mouth every 7 (seven) days. Take with a full glass of water on an empty stomach. (Patient not taking: Reported on 08/23/2020) 4 tablet 11   No current facility-administered medications on file prior to visit.    Review of Systems  Constitutional: Negative for activity change, appetite change, fatigue, fever and unexpected weight change.  HENT: Negative for congestion, ear pain, rhinorrhea, sinus pressure and sore throat.   Eyes: Negative for pain, redness and visual disturbance.  Respiratory: Negative for cough, shortness of breath and wheezing.   Cardiovascular: Negative for chest pain and palpitations.  Gastrointestinal: Negative for abdominal pain, blood in stool, constipation and diarrhea.  Endocrine: Negative for polydipsia and polyuria.  Genitourinary: Negative for dysuria, frequency and urgency.  Musculoskeletal: Negative for arthralgias, back pain and myalgias.  Skin: Negative for pallor and rash.  Allergic/Immunologic: Negative for environmental allergies.  Neurological: Negative for dizziness, syncope and headaches.  Hematological: Negative for adenopathy. Does not bruise/bleed easily.  Psychiatric/Behavioral: Negative for decreased concentration and dysphoric mood. The patient is not nervous/anxious.        Objective:   Physical Exam Constitutional:      General: She is not in acute distress.    Appearance: Normal appearance. She is well-developed. She is not ill-appearing or diaphoretic.  HENT:     Head: Normocephalic and atraumatic.     Right Ear: Tympanic membrane, ear canal and external ear normal.     Left Ear: Tympanic membrane, ear canal and external ear normal.     Nose: Nose normal. No congestion.     Mouth/Throat:     Mouth: Mucous membranes are moist.     Pharynx: Oropharynx is clear. No posterior oropharyngeal erythema.  Eyes:     General: No scleral icterus.    Extraocular Movements: Extraocular movements  intact.     Conjunctiva/sclera: Conjunctivae normal.     Pupils: Pupils are equal, round, and reactive to light.  Neck:     Thyroid: No thyromegaly.     Vascular: No carotid bruit or JVD.  Cardiovascular:     Rate and Rhythm: Normal rate and regular rhythm.     Pulses: Normal pulses.     Heart sounds: Normal heart sounds. No gallop.   Pulmonary:     Effort: Pulmonary effort is normal. No respiratory distress.     Breath sounds: Normal breath sounds. No wheezing.     Comments: Good air exch Chest:     Chest wall: No tenderness.  Abdominal:     General: Bowel sounds are normal. There is no distension or abdominal bruit.     Palpations: Abdomen is soft. There is no mass.     Tenderness: There is no abdominal tenderness.     Hernia: No hernia is present.  Genitourinary:    Comments: Breast exam: No mass, nodules, thickening, tenderness, bulging, retraction, inflamation, nipple discharge or skin changes noted.  No axillary or clavicular LA.     Musculoskeletal:        General: No tenderness. Normal range of motion.     Cervical back: Normal range of motion and neck supple. No rigidity. No muscular tenderness.     Right lower leg: No edema.     Left lower leg: No edema.     Comments: No kyphosis   Lymphadenopathy:     Cervical: No cervical adenopathy.  Skin:    General: Skin is warm and dry.     Coloration: Skin is not pale.     Findings: No erythema or rash.     Comments: Solar lentigines diffusely   Neurological:     Mental Status: She is alert. Mental status is at baseline.     Cranial Nerves: No cranial nerve deficit.     Motor: No abnormal muscle tone.     Coordination: Coordination normal.     Gait: Gait normal.     Deep Tendon Reflexes: Reflexes are normal and symmetric. Reflexes normal.  Psychiatric:        Mood and Affect: Mood normal.        Cognition and Memory: Cognition and memory normal.           Assessment & Plan:   Problem List Items Addressed This  Visit      Cardiovascular and Mediastinum   Hypertension    bp in fair control at this time  BP Readings from Last 1 Encounters:  08/23/20 122/70   No changes needed Most recent labs reviewed  Disc lifstyle change with low sodium diet and exercise  Plan to continue lisinopril hct 20-12.5 mg one half pill daily      Relevant Medications   simvastatin (ZOCOR) 10 MG tablet   lisinopril-hydrochlorothiazide (ZESTORETIC) 20-12.5 MG tablet     Musculoskeletal and Integument   Osteopenia  dexa rev 4/21  No falls or fx Enc strongly to start alendronate and handout given  D level is tx Enc more exercise         Other   Vitamin D deficiency    D level of 41 Vitamin D level is therapeutic with current supplementation Disc importance of this to bone and overall health Will continue current supplementation       Hyperlipidemia    Disc goals for lipids and reasons to control them Rev last labs with pt Rev low sat fat diet in detail  Plan to continue simvastatin daily       Relevant Medications   simvastatin (ZOCOR) 10 MG tablet   lisinopril-hydrochlorothiazide (ZESTORETIC) 20-12.5 MG tablet   Routine general medical examination at a health care facility    Reviewed health habits including diet and exercise and skin cancer prevention Reviewed appropriate screening tests for age  Also reviewed health mt list, fam hx and immunization status , as well as social and family history   See HPI Labs reviewed  Signed up for cologuard (did this 3 y ago) Given info to schedule her mammogram  Interested in shingrix vaccine if covered dexa utd, enc her to start the alendronate, no falls or fx and D level is tx Enc more exercise  Given materials to work on advance directive No cognitive concerns Nl hearing screen and vision screen      Encounter for Medicare annual wellness exam - Primary    Reviewed health habits including diet and exercise and skin cancer prevention Reviewed  appropriate screening tests for age  Also reviewed health mt list, fam hx and immunization status , as well as social and family history   See HPI Labs reviewed  Signed up for cologuard (did this 3 y ago) Given info to schedule her mammogram  Interested in shingrix vaccine if covered dexa utd, enc her to start the alendronate, no falls or fx and D level is tx Enc more exercise  Given materials to work on advance directive No cognitive concerns Nl hearing screen and vision screen

## 2020-08-23 NOTE — Patient Instructions (Addendum)
We will sign you up for cologuard  Get your mammogram set up at [facility] when you can   If you are interested in the new shingles vaccine (Shingrix) - call your local pharmacy to check on coverage and availability  If affordable, get on a wait list at your pharmacy to get the vaccine.  Please start the alendronate weekly for bone health   Please start exercising regularly   Work on the advance directive paperwork and get it notarized   Eat a healthy diet and drink lots of fluids

## 2020-08-23 NOTE — Assessment & Plan Note (Signed)
dexa rev 4/21  No falls or fx Enc strongly to start alendronate and handout given  D level is tx Enc more exercise

## 2020-08-23 NOTE — Assessment & Plan Note (Signed)
Reviewed health habits including diet and exercise and skin cancer prevention Reviewed appropriate screening tests for age  Also reviewed health mt list, fam hx and immunization status , as well as social and family history   See HPI Labs reviewed  Signed up for cologuard (did this 3 y ago) Given info to schedule her mammogram  Interested in shingrix vaccine if covered dexa utd, enc her to start the alendronate, no falls or fx and D level is tx Enc more exercise  Given materials to work on advance directive No cognitive concerns Nl hearing screen and vision screen 

## 2020-08-23 NOTE — Assessment & Plan Note (Signed)
Disc goals for lipids and reasons to control them Rev last labs with pt Rev low sat fat diet in detail  Plan to continue simvastatin daily

## 2020-08-30 ENCOUNTER — Encounter: Payer: Self-pay | Admitting: Internal Medicine

## 2020-08-30 ENCOUNTER — Telehealth (INDEPENDENT_AMBULATORY_CARE_PROVIDER_SITE_OTHER): Payer: Medicare Other | Admitting: Internal Medicine

## 2020-08-30 ENCOUNTER — Other Ambulatory Visit: Payer: Self-pay

## 2020-08-30 DIAGNOSIS — J019 Acute sinusitis, unspecified: Secondary | ICD-10-CM | POA: Insufficient documentation

## 2020-08-30 DIAGNOSIS — J0140 Acute pansinusitis, unspecified: Secondary | ICD-10-CM | POA: Insufficient documentation

## 2020-08-30 MED ORDER — ALBUTEROL SULFATE HFA 108 (90 BASE) MCG/ACT IN AERS: INHALATION_SPRAY | RESPIRATORY_TRACT | 0 refills | Status: DC

## 2020-08-30 NOTE — Assessment & Plan Note (Signed)
Only 2 days of symptoms Discussed that this is likely still just a viral infection Recommended adding fluticasone nasal spray Symptomatic Rx ---ibuprofen and decongestant  If not improving next week, will start amoxicillin

## 2020-08-30 NOTE — Patient Instructions (Signed)
Please add fluticasone (flonase) nasal spray--2 sprays in each nostril daily. If you are not better by Monday, email through MyChart and I will send an antibiotic

## 2020-08-30 NOTE — Progress Notes (Signed)
Subjective:    Patient ID: Colleen Cochran, female    DOB: 02-Jul-1945, 75 y.o.   MRN: [id number]  HPI Video virtual visit due to respiratory infection Identification done Reviewed limitations and billing and she gave consent Participants---patient in her home, and I am in my office  Started with sinus symptoms 2 days ago Left > right frontal headache Some maxillary pain and tooth pain Itching ears Some cough--woke her last night Slight scratchy throat Using ibuprofen and sinus pills (pseudoephedrine)---only slight help No fever---99.4 earlier No SOB Some post nasal drip  Current Outpatient Medications on File Prior to Visit  Medication Sig Dispense Refill  . albuterol (PROVENTIL HFA;VENTOLIN HFA) 108 (90 Base) MCG/ACT inhaler INHALE 1 TO 2 PUFFS BY MOUTH INTO THE LUNGS EVERY 4 HOURS 18 g 5  . aspirin 81 MG tablet Take 81 mg by mouth daily.    . cholecalciferol (VITAMIN D) 1000 UNITS tablet Take 1,000 Units by mouth daily.    Marland Kitchen lisinopril-hydrochlorothiazide (ZESTORETIC) 20-12.5 MG tablet Take 0.5 tablets by mouth daily. 45 tablet 11  . simvastatin (ZOCOR) 10 MG tablet TAKE 1 TABLET(10 MG) BY MOUTH DAILY 30 tablet 11  . alendronate (FOSAMAX) 70 MG tablet Take 1 tablet (70 mg total) by mouth every 7 (seven) days. Take with a full glass of water on an empty stomach. (Patient not taking: No sig reported) 4 tablet 11   No current facility-administered medications on file prior to visit.    No Known Allergies  Past Medical History:  Diagnosis Date  . Allergy    allergic rhinitis  . Arthritis   . History of colon polyps - adenomas 12/17/2007  . Hyperlipidemia   . Hypertension   . Kidney stone   . Nephrolithiasis    Hx of  . Osteopenia     Past Surgical History:  Procedure Laterality Date  . ABDOMINAL HYSTERECTOMY  1980   total for bleeding  . CESAREAN SECTION  1969  . COLONOSCOPY    . DILATION AND CURETTAGE OF UTERUS     x2    Family History  Problem Relation Age  of Onset  . Hypertension Mother   . Osteoporosis Mother   . Heart disease Father        CABG,CAD,MI  . Kidney failure Father   . Osteoporosis Father        ? of OP pt had hip fx.  . Hypertension Brother   . Colon cancer Neg Hx   . Pancreatic cancer Neg Hx   . Rectal cancer Neg Hx   . Stomach cancer Neg Hx   . Breast cancer Neg Hx     Social History   Socioeconomic History  . Marital status: Married    Spouse name: Not on file  . Number of children: Not on file  . Years of education: Not on file  . Highest education level: Not on file  Occupational History  . Not on file  Tobacco Use  . Smoking status: Passive Smoke Exposure - Never Smoker  . Smokeless tobacco: Never Used  . Tobacco comment: husband smokes  Vaping Use  . Vaping Use: Never used  Substance and Sexual Activity  . Alcohol use: No    Alcohol/week: 0.0 standard drinks  . Drug use: No  . Sexual activity: Never  Other Topics Concern  . Not on file  Social History Narrative  . Not on file   Social Determinants of Health   Financial Resource Strain: Not on file  Food Insecurity: Not on file  Transportation Needs: Not on file  Physical Activity: Not on file  Stress: Not on file  Social Connections: Not on file  Intimate Partner Violence: Not on file   Review of Systems  No N/V No loss of smell and taste 2 negative COVID tests this week     Objective:   Physical Exam Constitutional:      Appearance: Normal appearance.  Pulmonary:     Effort: Pulmonary effort is normal. No respiratory distress.  Neurological:     Mental Status: She is alert.            Assessment & Plan:

## 2020-08-31 ENCOUNTER — Telehealth: Payer: Self-pay | Admitting: Family Medicine

## 2020-08-31 DIAGNOSIS — J0140 Acute pansinusitis, unspecified: Secondary | ICD-10-CM

## 2020-08-31 MED ORDER — AMOXICILLIN-POT CLAVULANATE 875-125 MG PO TABS
1.0000 | ORAL_TABLET | Freq: Two times a day (BID) | ORAL | 0 refills | Status: DC
Start: 2020-08-31 — End: 2021-04-19

## 2020-08-31 NOTE — Telephone Encounter (Signed)
Spoke to pt. She said she is feeling worse today and has a fever 100.5 Headache worse. Asking since Dr Alphonsus Sias is out if Dr Milinda Antis would do an antibiotic. I advised her what Dr Alphonsus Sias had said about waiting until Monday, but since she is feeling worse, she wants something now. Will pass to Dr Milinda Antis since Dr Alphonsus Sias is out of the office.

## 2020-08-31 NOTE — Telephone Encounter (Signed)
Pt called in and stated that she saw Dr. Alphonsus Sias yesterday and he told her if she was not feeling any better than he would send in some medication. She is still running a tempeture, head still hurts and stopped up and ear itching.

## 2020-08-31 NOTE — Telephone Encounter (Signed)
Please ask if she has had a covid test or done one at home in light of temp?

## 2020-08-31 NOTE — Telephone Encounter (Signed)
I sent augmentin  to her [facility], thanks  Please keep [place] posted

## 2020-08-31 NOTE — Telephone Encounter (Signed)
Pt said she did at home covid test and it was negative. She is still requesting abx sent in

## 2020-08-31 NOTE — Telephone Encounter (Signed)
Pt notified Rx sent to pharmacy and advise of PCP's comments  

## 2020-09-14 ENCOUNTER — Other Ambulatory Visit: Payer: Self-pay | Admitting: Family Medicine

## 2020-09-24 ENCOUNTER — Other Ambulatory Visit: Payer: Self-pay | Admitting: Family Medicine

## 2020-09-24 DIAGNOSIS — Z1231 Encounter for screening mammogram for malignant neoplasm of breast: Secondary | ICD-10-CM

## 2020-09-28 ENCOUNTER — Other Ambulatory Visit: Payer: Self-pay

## 2020-09-28 ENCOUNTER — Ambulatory Visit
Admission: RE | Admit: 2020-09-28 | Discharge: 2020-09-28 | Disposition: A | Discharge status: Home / Self Care | Payer: Medicare Other | Source: Ambulatory Visit | Attending: Family Medicine | Admitting: Family Medicine

## 2020-09-28 DIAGNOSIS — Z1231 Encounter for screening mammogram for malignant neoplasm of breast: Secondary | ICD-10-CM

## 2020-10-05 LAB — COLOGUARD: Cologuard: NEGATIVE

## 2020-10-06 LAB — COLOGUARD: COLOGUARD: NEGATIVE

## 2020-10-11 ENCOUNTER — Encounter: Payer: Self-pay | Admitting: Family Medicine

## 2020-12-28 ENCOUNTER — Other Ambulatory Visit: Payer: Self-pay | Admitting: Family Medicine

## 2021-02-05 ENCOUNTER — Other Ambulatory Visit: Payer: Self-pay | Admitting: Internal Medicine

## 2021-02-18 HISTORY — PX: OTHER SURGICAL HISTORY: SHX169

## 2021-03-11 HISTORY — PX: EYE SURGERY: SHX253

## 2021-04-19 ENCOUNTER — Telehealth (INDEPENDENT_AMBULATORY_CARE_PROVIDER_SITE_OTHER): Payer: Medicare Other | Admitting: Family Medicine

## 2021-04-19 ENCOUNTER — Other Ambulatory Visit: Payer: Self-pay

## 2021-04-19 ENCOUNTER — Encounter: Payer: Self-pay | Admitting: Family Medicine

## 2021-04-19 DIAGNOSIS — J0110 Acute frontal sinusitis, unspecified: Secondary | ICD-10-CM | POA: Diagnosis not present

## 2021-04-19 DIAGNOSIS — J019 Acute sinusitis, unspecified: Secondary | ICD-10-CM | POA: Insufficient documentation

## 2021-04-19 MED ORDER — AMOXICILLIN-POT CLAVULANATE 875-125 MG PO TABS: 1.0000 | ORAL_TABLET | Freq: Two times a day (BID) | ORAL | 0 refills | Status: DC

## 2021-04-19 NOTE — Assessment & Plan Note (Signed)
S/p uri  Sinus pain and tenderness (worse frontal) and pain in teeth with purulent nasal d/c Fluids and rest  Saline/sinus rinse  flonase daily  Px augmentin bid for 7d  Update if not starting to improve in a week or if worsening  ER precautions discussed

## 2021-04-19 NOTE — Progress Notes (Signed)
Virtual Visit via Video Note  I connected with Colleen Cochran on 04/19/21 at 11:00 AM EST by a video enabled telemedicine application and verified that I am speaking with the correct person using two identifiers.  Location: Patient: home Provider: office   I discussed the limitations of evaluation and management by telemedicine and the availability of in person appointments. The patient expressed understanding and agreed to proceed.  Parties involved in encounter  Patient: Colleen Cochran  Provider:  Roxy Manns MD   Video failed today so visit was done by phone  History of Present Illness: Pt presents with c/o sinus symptoms and cough   Started a week ago  No h/o chronic issues  Like a cold  Now more sinus  Sinus pain and pressure  Tender above eyebrows Teeth hurt Ears itch Green/yellow nasal drainage Scratchy throat  Hoarse voice   She did a covid test-negative Low grade temp- 99 yesterday  Normal now   No sick contacts known   Cough- some last night  Not severe but some  No production / is a dry hacky cough    Otc Flonase Decongestant  Ibuprofen  Albuterol  Salt water gargle No saline   Patient Active Problem List   Diagnosis Date Noted   Acute sinusitis 04/19/2021   Acute non-recurrent pansinusitis 08/30/2020   Screening mammogram, encounter for 06/03/2017   Second hand smoke exposure 01/09/2016   Estrogen deficiency 04/04/2015   Encounter for Medicare annual wellness exam 09/30/2013   Family history of aortic aneurysm 09/30/2013   Routine general medical examination at a health care facility 11/17/2011   Hypertension 09/24/2010   POSTMENOPAUSAL STATUS 06/24/2010   Vitamin D deficiency 03/02/2009   History of colon polyps - adenomas 12/17/2007   Hyperlipidemia 10/05/2007   SINUSITIS, RECURRENT 10/05/2007   ALLERGIC RHINITIS 10/05/2007   Osteopenia 10/05/2007   NEPHROLITHIASIS, HX OF 10/05/2007   Past Medical History:  Diagnosis Date    Allergy    allergic rhinitis   Arthritis    History of colon polyps - adenomas 12/17/2007   Hyperlipidemia    Hypertension    Kidney stone    Nephrolithiasis    Hx of   Osteopenia    Past Surgical History:  Procedure Laterality Date   ABDOMINAL HYSTERECTOMY  1980   total for bleeding   CESAREAN SECTION  1969   COLONOSCOPY     DILATION AND CURETTAGE OF UTERUS     x2   Social History   Tobacco Use   Smoking status: Never    Passive exposure: Yes   Smokeless tobacco: Never   Tobacco comments:    husband smokes  Vaping Use   Vaping Use: Never used  Substance Use Topics   Alcohol use: No    Alcohol/week: 0.0 standard drinks   Drug use: No   Family History  Problem Relation Age of Onset   Hypertension Mother    Osteoporosis Mother    Heart disease Father        CABG,CAD,MI   Kidney failure Father    Osteoporosis Father        ? of OP pt had hip fx.   Hypertension Brother    Colon cancer Neg Hx    Pancreatic cancer Neg Hx    Rectal cancer Neg Hx    Stomach cancer Neg Hx    Breast cancer Neg Hx    No Known Allergies Current Outpatient Medications on File Prior to Visit  Medication Sig Dispense Refill  albuterol (VENTOLIN HFA) 108 (90 Base) MCG/ACT inhaler INHALE 1 TO 2 PUFFS BY MOUTH EVERY 4 HOURS 6.7 g 1   aspirin 81 MG tablet Take 81 mg by mouth daily.     cholecalciferol (VITAMIN D) 1000 UNITS tablet Take 1,000 Units by mouth daily.     lisinopril-hydrochlorothiazide (ZESTORETIC) 20-12.5 MG tablet TAKE 1/2 TABLET BY MOUTH DAILY 45 tablet 11   simvastatin (ZOCOR) 10 MG tablet TAKE 1 TABLET(10 MG) BY MOUTH DAILY 30 tablet 11   No current facility-administered medications on file prior to visit.   Review of Systems  Constitutional:  Negative for chills, fever and malaise/fatigue.  HENT:  Positive for congestion, sinus pain and sore throat. Negative for ear pain.   Eyes:  Negative for blurred vision, discharge and redness.  Respiratory:  Positive for cough.  Negative for sputum production, shortness of breath, wheezing and stridor.   Cardiovascular:  Negative for chest pain, palpitations and leg swelling.  Gastrointestinal:  Negative for abdominal pain, diarrhea, nausea and vomiting.  Musculoskeletal:  Negative for myalgias.  Skin:  Negative for rash.  Neurological:  Positive for headaches. Negative for dizziness.     Observations/Objective: Pt sounds well/not in distress  Nasal voice and a little hoarse Nl cognition/good historian Nl mood  No cough heard during visit Not sob with speech  Assessment and Plan: Problem List Items Addressed This Visit       Respiratory   Acute sinusitis    S/p uri  Sinus pain and tenderness (worse frontal) and pain in teeth with purulent nasal d/c Fluids and rest  Saline/sinus rinse  flonase daily  Px augmentin bid for 7d  Update if not starting to improve in a week or if worsening  ER precautions discussed      Relevant Medications   amoxicillin-clavulanate (AUGMENTIN) 875-125 MG tablet     Follow Up Instructions: Take augmentin as directed for sinus infection  Continue flonase  Add a saline sinus rinse if able   Update if not starting to improve in a week or if worsening  If severe symptoms or short of breath please alert [place] and go to the ER   I discussed the assessment and treatment plan with the patient. The patient was provided an opportunity to ask questions and all were answered. The patient agreed with the plan and demonstrated an understanding of the instructions.   The patient was advised to call back or seek an in-person evaluation if the symptoms worsen or if the condition fails to improve as anticipated.  I provided 16 minutes of non-face-to-face time during this encounter.   Roxy Manns, MD

## 2021-04-19 NOTE — Patient Instructions (Addendum)
Take augmentin as directed for sinus infection  Continue flonase  Add a saline sinus rinse if able   Update if not starting to improve in a week or if worsening  If severe symptoms or short of breath please alert [place] and go to the ER

## 2021-04-23 ENCOUNTER — Telehealth: Payer: Self-pay | Admitting: Family Medicine

## 2021-04-23 NOTE — Telephone Encounter (Signed)
Colleen Cochran called in and wanted to know what she can take for coughing due to she had high BP

## 2021-04-23 NOTE — Telephone Encounter (Signed)
Pt notified of Dr. Royden Purl recommendations and verbalized understanding and will keep [place] posted

## 2021-04-23 NOTE — Telephone Encounter (Signed)
Mucinex DM or robitussin DM if cough is productive  If cough is dry can use delsym (that is just DM without expectorant) Drink lots of fluids   All otc

## 2021-04-26 ENCOUNTER — Other Ambulatory Visit: Payer: Self-pay | Admitting: Family Medicine

## 2021-04-26 MED ORDER — PREDNISONE 10 MG PO TABS: ORAL_TABLET | ORAL | 0 refills | Status: DC

## 2021-04-26 NOTE — Telephone Encounter (Signed)
I pended a px for prednisone to send to pharmacy of choice Please send if agreeable   This will further help inflammation/swelling in sinuses and lungs and also cough   Follow up in the office if no improvement or if symptoms worsen (Go to ER if severe)

## 2021-04-26 NOTE — Telephone Encounter (Signed)
Pt notified of Dr. Royden Purl comments and Rx sent to pharmacy and ER precautions given

## 2021-04-26 NOTE — Telephone Encounter (Signed)
Pt called stating that she had a virtual visit with Dr Milinda Antis on 04/19/21. Pt stated that Dr Milinda Antis prescribed her medication amoxicillin-clavulanate (AUGMENTIN) 875-125 MG tablet. Pt states that its better but the symptoms are still on going. Pt is asking what should she do . Please advise.

## 2021-05-20 ENCOUNTER — Telehealth: Payer: Self-pay | Admitting: Family Medicine

## 2021-05-20 NOTE — Telephone Encounter (Signed)
Did she get better and then worse? If so for how long?  What symptoms is she having?

## 2021-05-20 NOTE — Telephone Encounter (Signed)
Pt called stating that she is having the same symptoms that she had on her visit on 04/19/21. Pt is asking if you could one of the prescription in amoxicillin-clavulanate (AUGMENTIN) 875-125 MG tablet or predniSONE (DELTASONE) 10 MG tablet. Please advise.

## 2021-05-21 ENCOUNTER — Encounter: Payer: Self-pay | Admitting: Family Medicine

## 2021-05-21 ENCOUNTER — Other Ambulatory Visit: Payer: Self-pay

## 2021-05-21 ENCOUNTER — Other Ambulatory Visit: Payer: Self-pay | Admitting: Family Medicine

## 2021-05-21 ENCOUNTER — Telehealth (INDEPENDENT_AMBULATORY_CARE_PROVIDER_SITE_OTHER): Payer: Medicare Other | Admitting: Family Medicine

## 2021-05-21 ENCOUNTER — Other Ambulatory Visit (INDEPENDENT_AMBULATORY_CARE_PROVIDER_SITE_OTHER): Payer: Medicare Other

## 2021-05-21 DIAGNOSIS — U071 COVID-19: Secondary | ICD-10-CM

## 2021-05-21 LAB — BASIC METABOLIC PANEL
BUN: 12 mg/dL (ref 6–23)
CO2: 32 mEq/L (ref 19–32)
Calcium: 9.5 mg/dL (ref 8.4–10.5)
Chloride: 101 mEq/L (ref 96–112)
Creatinine, Ser: 0.74 mg/dL (ref 0.40–1.20)
GFR: 79.71 mL/min (ref >60.00)
Glucose, Bld: 112 mg/dL — ABNORMAL HIGH (ref 70–99)
Potassium: 3.3 mEq/L — ABNORMAL LOW (ref 3.5–5.1)
Sodium: 138 mEq/L (ref 135–145)

## 2021-05-21 MED ORDER — NIRMATRELVIR/RITONAVIR (PAXLOVID)TABLET: 3.0000 | ORAL_TABLET | Freq: Two times a day (BID) | ORAL | 0 refills | Status: AC

## 2021-05-21 NOTE — Telephone Encounter (Signed)
Seen today with video visit

## 2021-05-21 NOTE — Addendum Note (Signed)
Addended by: Roxy Manns A on: 05/21/2021 04:46 PM   Modules accepted: Orders

## 2021-05-21 NOTE — Progress Notes (Signed)
Virtual Visit via Video Note  I connected with Colleen Cochran on 05/21/21 at 12:30 PM EST by a video enabled telemedicine application and verified that I am speaking with the correct person using two identifiers.  Location: Patient: home Provider: office    I discussed the limitations of evaluation and management by telemedicine and the availability of in person appointments. The patient expressed understanding and agreed to proceed.  Parties involved in encounter  Patient: Colleen Cochran  Provider:  Roxy Manns MD   History of Present Illness: Pt presents with symptoms of covid 19   Symptoms started Sunday  Scratchy throat - that improved  Headache around eyes  Ears itch  Teeth hurt  Congestion/ very nasally congested -mucous is yellow   Fever-temp up to 101  Some chills last night  Not a lot of body aches   Cough- some /not bad  No wheeze No sob  Dry /not productive   No n/v/d    Otc: Ibuprofen  Flonase  Saline  Mucinex   She is immunized for covid   Patient Active Problem List   Diagnosis Date Noted   COVID-19 05/21/2021   Acute sinusitis 04/19/2021   Screening mammogram, encounter for 06/03/2017   Second hand smoke exposure 01/09/2016   Estrogen deficiency 04/04/2015   Encounter for Medicare annual wellness exam 09/30/2013   Family history of aortic aneurysm 09/30/2013   Routine general medical examination at a health care facility 11/17/2011   Hypertension 09/24/2010   POSTMENOPAUSAL STATUS 06/24/2010   Vitamin D deficiency 03/02/2009   History of colon polyps - adenomas 12/17/2007   Hyperlipidemia 10/05/2007   SINUSITIS, RECURRENT 10/05/2007   ALLERGIC RHINITIS 10/05/2007   Osteopenia 10/05/2007   NEPHROLITHIASIS, HX OF 10/05/2007   Past Medical History:  Diagnosis Date   Allergy    allergic rhinitis   Arthritis    History of colon polyps - adenomas 12/17/2007   Hyperlipidemia    Hypertension    Kidney stone    Nephrolithiasis    Hx of    Osteopenia    Past Surgical History:  Procedure Laterality Date   ABDOMINAL HYSTERECTOMY  1980   total for bleeding   CESAREAN SECTION  1969   COLONOSCOPY     DILATION AND CURETTAGE OF UTERUS     x2   Social History   Tobacco Use   Smoking status: Never    Passive exposure: Yes   Smokeless tobacco: Never   Tobacco comments:    husband smokes  Vaping Use   Vaping Use: Never used  Substance Use Topics   Alcohol use: No    Alcohol/week: 0.0 standard drinks   Drug use: No   Family History  Problem Relation Age of Onset   Hypertension Mother    Osteoporosis Mother    Heart disease Father        CABG,CAD,MI   Kidney failure Father    Osteoporosis Father        ? of OP pt had hip fx.   Hypertension Brother    Colon cancer Neg Hx    Pancreatic cancer Neg Hx    Rectal cancer Neg Hx    Stomach cancer Neg Hx    Breast cancer Neg Hx    No Known Allergies Current Outpatient Medications on File Prior to Visit  Medication Sig Dispense Refill   albuterol (VENTOLIN HFA) 108 (90 Base) MCG/ACT inhaler INHALE 1 TO 2 PUFFS BY MOUTH EVERY 4 HOURS 6.7 g 1  aspirin 81 MG tablet Take 81 mg by mouth daily.     cholecalciferol (VITAMIN D) 1000 UNITS tablet Take 1,000 Units by mouth daily.     lisinopril-hydrochlorothiazide (ZESTORETIC) 20-12.5 MG tablet TAKE 1/2 TABLET BY MOUTH DAILY 45 tablet 11   simvastatin (ZOCOR) 10 MG tablet TAKE 1 TABLET(10 MG) BY MOUTH DAILY 30 tablet 11   No current facility-administered medications on file prior to visit.   Review of Systems  Constitutional:  Negative for chills, fever and malaise/fatigue.  HENT:  Positive for congestion and sinus pain. Negative for ear pain and sore throat.   Eyes:  Negative for blurred vision, discharge and redness.  Respiratory:  Positive for cough. Negative for sputum production, shortness of breath, wheezing and stridor.   Cardiovascular:  Negative for chest pain, palpitations and leg swelling.  Gastrointestinal:   Negative for abdominal pain, diarrhea, nausea and vomiting.  Musculoskeletal:  Negative for myalgias.  Skin:  Negative for rash.  Neurological:  Positive for headaches. Negative for dizziness.     Observations/Objective: Patient appears well, in no distress Weight is baseline  No facial swelling or asymmetry Mildly hoarse voice Nasal congestion with sniffling  No obvious tremor or mobility impairment Moving neck and UEs normally Able to hear the call well  No cough or shortness of breath during interview  Talkative and mentally sharp with no cognitive changes No skin changes on face or neck , no rash or pallor Affect is normal    Assessment and Plan: Problem List Items Addressed This Visit       Other   COVID-19    day2-3 of symptoms including fever /moderate No sob or wheeze Discussed symptom care incl mucinex and tylenol and saline  Handout given  ER precautions discussed  Given age and risk factors, would like to start paxlovid Stat renal labs ordered-to get today and then send in px  Rev poss side effects /will update  Update if not starting to improve in a week or if worsening          Follow Up Instructions: The office will call to set up drive through labs Once you do that and I get a result I can send in Paxlovid Take it as directed If any significant side effects hold it and alert [place]   Drink fluids and rest  mucinex DM is good for cough and congestion  Nasal saline for congestion as needed  Flonase is good also Tylenol or ibuprofen for fever or pain or headache  Please alert [place] if symptoms worsen (if severe or short of breath please go to the ER)   Isolate for a minimum of 5 days or until patients are better  Mask after that for 10 days    I discussed the assessment and treatment plan with the patient. The patient was provided an opportunity to ask questions and all were answered. The patient agreed with the plan and demonstrated an understanding of the  instructions.   The patient was advised to call back or seek an in-person evaluation if the symptoms worsen or if the condition fails to improve as anticipated.     Roxy Manns, MD

## 2021-05-21 NOTE — Patient Instructions (Addendum)
The office will call to set up drive through labs Once you do that and I get a result I can send in Paxlovid Take it as directed If any significant side effects hold it and alert [place]   Drink fluids and rest  mucinex DM is good for cough and congestion  Nasal saline for congestion as needed  Flonase is good also Tylenol or ibuprofen for fever or pain or headache  Please alert [place] if symptoms worsen (if severe or short of breath please go to the ER)   Isolate for a minimum of 5 days or until patients are better  Mask after that for 10 days

## 2021-05-21 NOTE — Assessment & Plan Note (Signed)
day2-3 of symptoms including fever /moderate No sob or wheeze Discussed symptom care incl mucinex and tylenol and saline  Handout given  ER precautions discussed  Given age and risk factors, would like to start paxlovid Stat renal labs ordered-to get today and then send in px  Rev poss side effects /will update  Update if not starting to improve in a week or if worsening

## 2021-05-21 NOTE — Telephone Encounter (Signed)
Did she get better and then worse? Pt did get better but then Sunday sxs started again If so for how long?  started on Sunday What symptoms is she having? ST, HA, facial pain, ear pain/itching, teeth pain, fever, fatigue  Pt was advised to also take covid test when she called yesterday and she took one today and her covid test was positive. She has since scheduled a virtual visit with Dr. Milinda Antis tomorrow 05/22/21 but not sure if she should wait until that appt or is there something she can get today.  Pt has been taking ibuprofen for fever

## 2021-05-22 ENCOUNTER — Telehealth: Payer: Medicare Other | Admitting: Family Medicine

## 2021-05-24 ENCOUNTER — Telehealth: Payer: Self-pay | Admitting: Family Medicine

## 2021-05-24 NOTE — Telephone Encounter (Signed)
Pt called asking she tested positive for covid and is on medication Paxlovid. Pt states she will be finish with it on Sunday. Pt is asking when can she go back on her cholesterol medication. Please advise.

## 2021-05-24 NOTE — Telephone Encounter (Signed)
Please start back on the cholesterol medicine the day after you finish paxlovid

## 2021-05-24 NOTE — Telephone Encounter (Signed)
Pt notified of Dr. Tower's comments  

## 2021-08-26 ENCOUNTER — Telehealth: Payer: Self-pay | Admitting: Family Medicine

## 2021-08-26 ENCOUNTER — Ambulatory Visit (INDEPENDENT_AMBULATORY_CARE_PROVIDER_SITE_OTHER): Payer: Medicare Other

## 2021-08-26 VITALS — Ht 66.0 in | Wt 140.0 lb

## 2021-08-26 DIAGNOSIS — Z0000 Encounter for general adult medical examination without abnormal findings: Secondary | ICD-10-CM

## 2021-08-26 DIAGNOSIS — I10 Essential (primary) hypertension: Secondary | ICD-10-CM

## 2021-08-26 DIAGNOSIS — E782 Mixed hyperlipidemia: Secondary | ICD-10-CM

## 2021-08-26 DIAGNOSIS — E559 Vitamin D deficiency, unspecified: Secondary | ICD-10-CM

## 2021-08-26 NOTE — Progress Notes (Signed)
? ?Subjective:  ? Colleen Cochran is a 76 y.o. female who presents for Medicare Annual (Subsequent) preventive examination. ?Virtual Visit via Telephone Note ? ?I connected with  [name] on 08/26/21 at  8:45 AM EDT by telephone and verified that I am speaking with the correct person using two identifiers. ? ?Location: ?Patient: HOME ?Provider: [facility] ?Persons participating in the virtual visit: patient/Nurse Health Advisor ?  ?I discussed the limitations, risks, security and privacy concerns of performing an evaluation and management service by telephone and the availability of in person appointments. The patient expressed understanding and agreed to proceed. ? ?Interactive audio and video telecommunications were attempted between this nurse and patient, however failed, due to patient having technical difficulties OR patient did not have access to video capability.  We continued and completed visit with audio only. ? ?Some vital signs may be absent or patient reported.  ? ?Darral Dash, LPN ? ?Review of Systems    ? ?Cardiac Risk Factors include: advanced age (>32men, >35 women);hypertension;dyslipidemia;sedentary lifestyle ? ?   ?Objective:  ?  ?Today's Vitals  ? 08/26/21 0846  ?Weight: 140 lb (63.5 kg)  ?Height: 5\' 6"  (1.676 m)  ? ?Body mass index is 22.6 kg/m?. ? ? ?  08/26/2021  ?  8:57 AM 06/13/2019  ? 10:32 AM 06/08/2018  ?  4:26 PM 05/28/2017  ?  9:52 AM 05/27/2016  ?  1:25 PM 12/12/2013  ? 12:38 PM 11/28/2013  ? 12:40 PM  ?Advanced Directives  ?Does Patient Have a Medical Advance Directive? No No No No No No Patient does not have advance directive  ?Would patient like information on creating a medical advance directive? No - Patient declined No - Patient declined No - Patient declined Yes (MAU/Ambulatory/Procedural Areas - Information given)     ?Pre-existing out of facility DNR order (yellow form or pink MOST form)       No  ? ? ?Current Medications (verified) ?Outpatient Encounter Medications as of  08/26/2021  ?Medication Sig  ? albuterol (VENTOLIN HFA) 108 (90 Base) MCG/ACT inhaler INHALE 1 TO 2 PUFFS BY MOUTH EVERY 4 HOURS  ? aspirin 81 MG tablet Take 81 mg by mouth daily.  ? cholecalciferol (VITAMIN D) 1000 UNITS tablet Take 1,000 Units by mouth daily.  ? lisinopril-hydrochlorothiazide (ZESTORETIC) 20-12.5 MG tablet TAKE 1/2 TABLET BY MOUTH DAILY  ? simvastatin (ZOCOR) 10 MG tablet TAKE 1 TABLET(10 MG) BY MOUTH DAILY  ? ?No facility-administered encounter medications on file as of 08/26/2021.  ? ? ?Allergies (verified) ?Patient has no known allergies.  ? ?History: ?Past Medical History:  ?Diagnosis Date  ? Allergy   ? allergic rhinitis  ? Arthritis   ? History of colon polyps - adenomas 12/17/2007  ? Hyperlipidemia   ? Hypertension   ? Kidney stone   ? Nephrolithiasis   ? Hx of  ? Osteopenia   ? ?Past Surgical History:  ?Procedure Laterality Date  ? ABDOMINAL HYSTERECTOMY  1980  ? total for bleeding  ? CESAREAN SECTION  1969  ? COLONOSCOPY    ? DILATION AND CURETTAGE OF UTERUS    ? x2  ? ?Family History  ?Problem Relation Age of Onset  ? Hypertension Mother   ? Osteoporosis Mother   ? Heart disease Father   ?     CABG,CAD,MI  ? Kidney failure Father   ? Osteoporosis Father   ?     ? of OP pt had hip fx.  ? Hypertension Brother   ?  Colon cancer Neg Hx   ? Pancreatic cancer Neg Hx   ? Rectal cancer Neg Hx   ? Stomach cancer Neg Hx   ? Breast cancer Neg Hx   ? ?Social History  ? ?Socioeconomic History  ? Marital status: Married  ?  Spouse name: Dwane  ? Number of children: 1  ? Years of education: Not on file  ? Highest education level: Not on file  ?Occupational History  ? Not on file  ?Tobacco Use  ? Smoking status: Never  ?  Passive exposure: Yes  ? Smokeless tobacco: Never  ? Tobacco comments:  ?  husband smokes  ?Vaping Use  ? Vaping Use: Never used  ?Substance and Sexual Activity  ? Alcohol use: No  ?  Alcohol/week: 0.0 standard drinks  ? Drug use: No  ? Sexual activity: Never  ?Other Topics Concern  ? Not on  file  ?Social History Narrative  ? 7 grandchildren.  ? 2 great grandchildren.  ? Married x 54 years in 2023.  ? ?Social Determinants of Health  ? ?Financial Resource Strain: Low Risk   ? Difficulty of Paying Living Expenses: Not hard at all  ?Food Insecurity: No Food Insecurity  ? Worried About Programme researcher, broadcasting/film/videounning Out of Food in the Last Year: Never true  ? Ran Out of Food in the Last Year: Never true  ?Transportation Needs: No Transportation Needs  ? Lack of Transportation (Medical): No  ? Lack of Transportation (Non-Medical): No  ?Physical Activity: Insufficiently Active  ? Days of Exercise per Week: 5 days  ? Minutes of Exercise per Session: 10 min  ?Stress: No Stress Concern Present  ? Feeling of Stress : Not at all  ?Social Connections: Socially Integrated  ? Frequency of Communication with Friends and Family: More than three times a week  ? Frequency of Social Gatherings with Friends and Family: More than three times a week  ? Attends Religious Services: More than 4 times per year  ? Active Member of Clubs or Organizations: Yes  ? Attends BankerClub or Organization Meetings: More than 4 times per year  ? Marital Status: Married  ? ? ?Tobacco Counseling ?Counseling given: Not Answered ?Tobacco comments: husband smokes ? ? ?Clinical Intake: ? ?Pre-visit preparation completed: Yes ? ?Pain : No/denies pain ? ?  ? ?BMI - recorded: 22.6 ?Nutritional Status: BMI of 19-24  Normal ?Nutritional Risks: None ?Diabetes: No ? ?How often do you need to have someone help you when you read instructions, pamphlets, or other written materials from your doctor or pharmacy?: 1 - Never ? ?Diabetic?NO ? ?Interpreter Needed?: No ? ?Information entered by :: mj Rasheeda Mulvehill, lpn ? ? ?Activities of Daily Living ? ?  08/26/2021  ?  9:00 AM  ?In your present state of health, do you have any difficulty performing the following activities:  ?Hearing? 0  ?Vision? 0  ?Difficulty concentrating or making decisions? 0  ?Walking or climbing stairs? 0  ?Dressing or  bathing? 0  ?Doing errands, shopping? 0  ?Preparing Food and eating ? N  ?Using the Toilet? N  ?In the past six months, have you accidently leaked urine? N  ?Do you have problems with loss of bowel control? N  ?Managing your Medications? N  ?Managing your Finances? N  ?Housekeeping or managing your Housekeeping? N  ? ? ?Patient Care Team: ?Tower, Audrie GallusMarne A, MD as PCP - General ?Kassie Mendsodd Greeson, DDS as Consulting Physician (Dentistry) ?Debbrah AlarIsenstein, Arin L, MD as Consulting Physician (Dermatology) ?Irene LimboBell, Phillip T.,  MD as Consulting Physician (Ophthalmology) ? ?Indicate any recent Medical Services you may have received from other than [facility] providers in the past year (date may be approximate). ? ?   ?Assessment:  ? This is a routine wellness examination for [facility]. ? ?Hearing/Vision screen ?Hearing Screening - Comments:: No hearing issues.  ?Vision Screening - Comments:: Glasses. Dr. Alvester Morin in [place]. 2023. ? ?Dietary issues and exercise activities discussed: ?Current Exercise Habits: Home exercise routine, Type of exercise: walking, Time (Minutes): 10, Frequency (Times/Week): 5, Weekly Exercise (Minutes/Week): 50, Intensity: Mild, Exercise limited by: cardiac condition(s) ? ? Goals Addressed   ?None ?  ? ?Depression Screen ? ?  08/26/2021  ?  8:51 AM 08/23/2020  ?  8:58 AM 06/13/2019  ? 10:34 AM 06/08/2018  ?  8:11 AM 05/28/2017  ?  9:42 AM 05/27/2016  ?  1:05 PM 05/13/2016  ?  8:35 AM  ?PHQ 2/9 Scores  ?PHQ - 2 Score 0 0 0 0 0 0 0  ?PHQ- 9 Score   0 0 0    ?  ?Fall Risk ? ?  08/26/2021  ?  8:58 AM 08/23/2020  ?  8:57 AM 06/13/2019  ? 10:33 AM 06/08/2018  ?  8:11 AM 05/28/2017  ?  9:42 AM  ?Fall Risk   ?Falls in the past year? 0  0 0 No  ?Number falls in past yr: 0 0 0    ?Injury with Fall? 0  0    ?Risk for fall due to : No Fall Risks  Medication side effect    ?Follow up Falls prevention discussed Falls evaluation completed Falls evaluation completed;Falls prevention discussed    ? ? ?FALL RISK PREVENTION PERTAINING TO THE HOME: ? ?Any  stairs in or around the home? No  ?If so, are there any without handrails? No  ?Home free of loose throw rugs in walkways, pet beds, electrical cords, etc? Yes  ?Adequate lighting in your home to reduce risk of

## 2021-08-26 NOTE — Telephone Encounter (Signed)
-----   Message from Velna Hatchet, RT sent at 08/12/2021  9:17 AM EDT ----- ?Regarding: Lab Tue 08/27/21 ?Patient is scheduled for cpx, please order future labs.  Thanks, Anda Kraft  ? ?

## 2021-08-26 NOTE — Patient Instructions (Signed)
Colleen Cochran , ?Thank you for taking time to come for your Medicare Wellness Visit. I appreciate your ongoing commitment to your health goals. Please review the following plan we discussed and let me know if I can assist you in the future.  ? ?Screening recommendations/referrals: ?Colonoscopy: Cologuard done 10/05/2020. Repeat due in 2025. ? ?Mammogram: Done 09/28/2020 Repeat annually ? ?Bone Density: Done 08/11/2019 Repeat every 2 years ? ?Recommended yearly ophthalmology/optometry visit for glaucoma screening and checkup ?Recommended yearly dental visit for hygiene and checkup ? ?Vaccinations: ?Influenza vaccine: Due Fall 2023. ?Pneumococcal vaccine: Done 04/04/2015 and 09/30/2013 ?Tdap vaccine: Done 10/06/2007 Repeat in 10 years ? ?Shingles vaccine: Done 11/24/2010. Discussed Shingrix.   ?Covid-19:Done 06/12/2019 and 07/06/2019. ? ?Advanced directives: Advance directive discussed with you today. Even though you declined this today, please call our office should you change your mind, and we can give you the proper paperwork for you to fill out. ? ? ?Conditions/risks identified: Aim for 30 minutes of exercise or brisk walking, 6-8 glasses of water, and 5 servings of fruits and vegetables each day. ? ? ?Next appointment: Follow up in one year for your annual wellness visit 2024. ? ? ?Preventive Care 3 Years and Older, Female ?Preventive care refers to lifestyle choices and visits with your health care provider that can promote health and wellness. ?What does preventive care include? ?A yearly physical exam. This is also called an annual well check. ?Dental exams once or twice a year. ?Routine eye exams. Ask your health care provider how often you should have your eyes checked. ?Personal lifestyle choices, including: ?Daily care of your teeth and gums. ?Regular physical activity. ?Eating a healthy diet. ?Avoiding tobacco and drug use. ?Limiting alcohol use. ?Practicing safe sex. ?Taking low-dose aspirin every day. ?Taking  vitamin and mineral supplements as recommended by your health care provider. ?What happens during an annual well check? ?The services and screenings done by your health care provider during your annual well check will depend on your age, overall health, lifestyle risk factors, and family history of disease. ?Counseling  ?Your health care provider may ask you questions about your: ?Alcohol use. ?Tobacco use. ?Drug use. ?Emotional well-being. ?Home and relationship well-being. ?Sexual activity. ?Eating habits. ?History of falls. ?Memory and ability to understand (cognition). ?Work and work Astronomer. ?Reproductive health. ?Screening  ?You may have the following tests or measurements: ?Height, weight, and BMI. ?Blood pressure. ?Lipid and cholesterol levels. These may be checked every 5 years, or more frequently if you are over 66 years old. ?Skin check. ?Lung cancer screening. You may have this screening every year starting at age 53 if you have a 30-pack-year history of smoking and currently smoke or have quit within the past 15 years. ?Fecal occult blood test (FOBT) of the stool. You may have this test every year starting at age 58. ?Flexible sigmoidoscopy or colonoscopy. You may have a sigmoidoscopy every 5 years or a colonoscopy every 10 years starting at age 20. ?Hepatitis C blood test. ?Hepatitis B blood test. ?Sexually transmitted disease (STD) testing. ?Diabetes screening. This is done by checking your blood sugar (glucose) after you have not eaten for a while (fasting). You may have this done every 1-3 years. ?Bone density scan. This is done to screen for osteoporosis. You may have this done starting at age 79. ?Mammogram. This may be done every 1-2 years. Talk to your health care provider about how often you should have regular mammograms. ?Talk with your health care provider about your test results,  treatment options, and if necessary, the need for more tests. ?Vaccines  ?Your health care provider may  recommend certain vaccines, such as: ?Influenza vaccine. This is recommended every year. ?Tetanus, diphtheria, and acellular pertussis (Tdap, Td) vaccine. You may need a Td booster every 10 years. ?Zoster vaccine. You may need this after age 75. ?Pneumococcal 13-valent conjugate (PCV13) vaccine. One dose is recommended after age 75. ?Pneumococcal polysaccharide (PPSV23) vaccine. One dose is recommended after age 75. ?Talk to your health care provider about which screenings and vaccines you need and how often you need them. ?This information is not intended to replace advice given to you by your health care provider. Make sure you discuss any questions you have with your health care provider. ?Document Released: 05/04/2015 Document Revised: 12/26/2015 Document Reviewed: 02/06/2015 ?Elsevier Interactive Patient Education ? 2017 [employer] ? ?Fall Prevention in the Home ?Falls can cause injuries. They can happen to people of all ages. There are many things you can do to make your home safe and to help prevent falls. ?What can I do on the outside of my home? ?Regularly fix the edges of walkways and driveways and fix any cracks. ?Remove anything that might make you trip as you walk through a door, such as a raised step or threshold. ?Trim any bushes or trees on the path to your home. ?Use bright outdoor lighting. ?Clear any walking paths of anything that might make someone trip, such as rocks or tools. ?Regularly check to see if handrails are loose or broken. Make sure that both sides of any steps have handrails. ?Any raised decks and porches should have guardrails on the edges. ?Have any leaves, snow, or ice cleared regularly. ?Use sand or salt on walking paths during winter. ?Clean up any spills in your garage right away. This includes oil or grease spills. ?What can I do in the bathroom? ?Use night lights. ?Install grab bars by the toilet and in the tub and shower. Do not use towel bars as grab bars. ?Use non-skid  mats or decals in the tub or shower. ?If you need to sit down in the shower, use a plastic, non-slip stool. ?Keep the floor dry. Clean up any water that spills on the floor as soon as it happens. ?Remove soap buildup in the tub or shower regularly. ?Attach bath mats securely with double-sided non-slip rug tape. ?Do not have throw rugs and other things on the floor that can make you trip. ?What can I do in the bedroom? ?Use night lights. ?Make sure that you have a light by your bed that is easy to reach. ?Do not use any sheets or blankets that are too big for your bed. They should not hang down onto the floor. ?Have a firm chair that has side arms. You can use this for support while you get dressed. ?Do not have throw rugs and other things on the floor that can make you trip. ?What can I do in the kitchen? ?Clean up any spills right away. ?Avoid walking on wet floors. ?Keep items that you use a lot in easy-to-reach places. ?If you need to reach something above you, use a strong step stool that has a grab bar. ?Keep electrical cords out of the way. ?Do not use floor polish or wax that makes floors slippery. If you must use wax, use non-skid floor wax. ?Do not have throw rugs and other things on the floor that can make you trip. ?What can I do with my stairs? ?Do  not leave any items on the stairs. ?Make sure that there are handrails on both sides of the stairs and use them. Fix handrails that are broken or loose. Make sure that handrails are as long as the stairways. ?Check any carpeting to make sure that it is firmly attached to the stairs. Fix any carpet that is loose or worn. ?Avoid having throw rugs at the top or bottom of the stairs. If you do have throw rugs, attach them to the floor with carpet tape. ?Make sure that you have a light switch at the top of the stairs and the bottom of the stairs. If you do not have them, ask someone to add them for you. ?What else can I do to help prevent falls? ?Wear shoes  that: ?Do not have high heels. ?Have rubber bottoms. ?Are comfortable and fit you well. ?Are closed at the toe. Do not wear sandals. ?If you use a stepladder: ?Make sure that it is fully opened. Do not climb a clos

## 2021-08-27 ENCOUNTER — Other Ambulatory Visit (INDEPENDENT_AMBULATORY_CARE_PROVIDER_SITE_OTHER): Payer: Medicare Other

## 2021-08-27 DIAGNOSIS — E782 Mixed hyperlipidemia: Secondary | ICD-10-CM | POA: Diagnosis not present

## 2021-08-27 DIAGNOSIS — E559 Vitamin D deficiency, unspecified: Secondary | ICD-10-CM

## 2021-08-27 DIAGNOSIS — I10 Essential (primary) hypertension: Secondary | ICD-10-CM | POA: Diagnosis not present

## 2021-08-27 LAB — COMPREHENSIVE METABOLIC PANEL
ALT: 17 U/L (ref 0–35)
AST: 19 U/L (ref 0–37)
Albumin: 4.3 g/dL (ref 3.5–5.2)
Alkaline Phosphatase: 65 U/L (ref 39–117)
BUN: 19 mg/dL (ref 6–23)
CO2: 29 mEq/L (ref 19–32)
Calcium: 9.4 mg/dL (ref 8.4–10.5)
Chloride: 102 mEq/L (ref 96–112)
Creatinine, Ser: 0.68 mg/dL (ref 0.40–1.20)
GFR: 85.64 mL/min (ref >60.00)
Glucose, Bld: 88 mg/dL (ref 70–99)
Potassium: 3.7 mEq/L (ref 3.5–5.1)
Sodium: 140 mEq/L (ref 135–145)
Total Bilirubin: 0.5 mg/dL (ref 0.2–1.2)
Total Protein: 7.4 g/dL (ref 6.0–8.3)

## 2021-08-27 LAB — CBC WITH DIFFERENTIAL/PLATELET
Basophils Absolute: 0 10*3/uL (ref 0.0–0.1)
Basophils Relative: 0.7 % (ref 0.0–3.0)
Eosinophils Absolute: 0.1 10*3/uL (ref 0.0–0.7)
Eosinophils Relative: 2.5 % (ref 0.0–5.0)
HCT: 38.1 % (ref 36.0–46.0)
Hemoglobin: 13.1 g/dL (ref 12.0–15.0)
Lymphocytes Relative: 26.8 % (ref 12.0–46.0)
Lymphs Abs: 1.4 10*3/uL (ref 0.7–4.0)
MCHC: 34.4 g/dL (ref 30.0–36.0)
MCV: 88.7 fl (ref 78.0–100.0)
Monocytes Absolute: 0.6 10*3/uL (ref 0.1–1.0)
Monocytes Relative: 10.8 % (ref 3.0–12.0)
Neutro Abs: 3.1 10*3/uL (ref 1.4–7.7)
Neutrophils Relative %: 59.2 % (ref 43.0–77.0)
Platelets: 330 10*3/uL (ref 150.0–400.0)
RBC: 4.29 Mil/uL (ref 3.87–5.11)
RDW: 13.1 % (ref 11.5–15.5)
WBC: 5.2 10*3/uL (ref 4.0–10.5)

## 2021-08-27 LAB — VITAMIN D 25 HYDROXY (VIT D DEFICIENCY, FRACTURES): VITD: 41.45 ng/mL (ref 30.00–100.00)

## 2021-08-27 LAB — TSH: TSH: 1.02 u[IU]/mL (ref 0.35–5.50)

## 2021-08-27 LAB — LIPID PANEL
Cholesterol: 162 mg/dL (ref 0–200)
HDL: 46.2 mg/dL (ref >39.00)
LDL Cholesterol: 98 mg/dL (ref 0–99)
NonHDL: 116.09
Total CHOL/HDL Ratio: 4
Triglycerides: 92 mg/dL (ref 0.0–149.0)
VLDL: 18.4 mg/dL (ref 0.0–40.0)

## 2021-08-28 ENCOUNTER — Other Ambulatory Visit: Payer: Self-pay | Admitting: Family Medicine

## 2021-09-02 ENCOUNTER — Ambulatory Visit (INDEPENDENT_AMBULATORY_CARE_PROVIDER_SITE_OTHER): Payer: Medicare Other | Admitting: Family Medicine

## 2021-09-02 ENCOUNTER — Encounter: Payer: Self-pay | Admitting: Family Medicine

## 2021-09-02 VITALS — BP 132/80 | HR 81 | Ht 65.5 in | Wt 142.0 lb

## 2021-09-02 DIAGNOSIS — Z0000 Encounter for general adult medical examination without abnormal findings: Secondary | ICD-10-CM

## 2021-09-02 DIAGNOSIS — M8589 Other specified disorders of bone density and structure, multiple sites: Secondary | ICD-10-CM | POA: Diagnosis not present

## 2021-09-02 DIAGNOSIS — E559 Vitamin D deficiency, unspecified: Secondary | ICD-10-CM

## 2021-09-02 DIAGNOSIS — E782 Mixed hyperlipidemia: Secondary | ICD-10-CM | POA: Diagnosis not present

## 2021-09-02 DIAGNOSIS — E2839 Other primary ovarian failure: Secondary | ICD-10-CM

## 2021-09-02 DIAGNOSIS — I10 Essential (primary) hypertension: Secondary | ICD-10-CM | POA: Diagnosis not present

## 2021-09-02 DIAGNOSIS — Z1231 Encounter for screening mammogram for malignant neoplasm of breast: Secondary | ICD-10-CM

## 2021-09-02 MED ORDER — LISINOPRIL-HYDROCHLOROTHIAZIDE 20-12.5 MG PO TABS: 0.5000 | ORAL_TABLET | Freq: Every day | ORAL | 11 refills | Status: DC

## 2021-09-02 MED ORDER — SIMVASTATIN 10 MG PO TABS: ORAL_TABLET | ORAL | 11 refills | Status: DC

## 2021-09-02 NOTE — Assessment & Plan Note (Signed)
dexa 2021 ?Due now  ?Order in ?No falls/fx ?Taking D and level is tx ?Enc resistance/wt bearing exercise  ?

## 2021-09-02 NOTE — Assessment & Plan Note (Signed)
Disc goals for lipids and reasons to control them ?Rev last labs with pt ?Rev low sat fat diet in detail ?LDL stable- less than 100 ?Plan to continue simvastatin 10 mg daily  ? ?

## 2021-09-02 NOTE — Progress Notes (Signed)
? ?Subjective:  ? ? Patient ID: Colleen Cochran, female    DOB: 03/22/1946, 76 y.o.   MRN: [id number] ? ?HPI ?Here for health maintenance exam and to review chronic medical problems   ? ?Wt Readings from Last 3 Encounters:  ?09/02/21 142 lb (64.4 kg)  ?08/26/21 140 lb (63.5 kg)  ?04/19/21 139 lb (63 kg)  ? ?23.27 kg/m? ? ?Feeling good  ?Taking care of herself  ? ?Reviewed amw from 5/8 ? ?Immunization History  ?Administered Date(s) Administered  ? Fluad Quad(high Dose 65+) 01/21/2019  ? Influenza,inj,Quad PF,6+ Mos 01/09/2014, 04/04/2015, 01/09/2016, 02/10/2017, 03/11/2018  ? PFIZER(Purple Top)SARS-COV-2 Vaccination 06/12/2019, 07/06/2019  ? Pneumococcal Conjugate-13 04/04/2015  ? Pneumococcal Polysaccharide-23 09/30/2013  ? Td 10/06/2007  ? Varicella Zoster Immune Globulin 09/24/2010  ? Zoster, Live 11/24/2010  ? ? ?Zoster status : may be interested in shingrix  ? ?Mammogram 09/2020 -due next month  ?Self breast exam ? ?Cologuard neg 09/2020 ? ?Dexa 07/2019  osteopenia  ?Falls/fx- none  ?Due for dexa  ? ?Supplements -D  ?Vit D level nl at 41.4 ? ?HTN ?bp is stable today  ?No cp or palpitations or headaches or edema  ?No side effects to medicines  ?BP Readings from Last 3 Encounters:  ?09/02/21 132/80  ?04/19/21 121/73  ?08/30/20 119/74  ?  Lisinpril hct 20-12.5 mg 1/2 pill daily  ? ?Bp is up and down  ?Does get as low as 90s/50s once or twice but otherwise gets higher   up 142/86   ? ? ? ? ?Lab Results  ?Component Value Date  ? CREATININE 0.68 08/27/2021  ? BUN 19 08/27/2021  ? NA 140 08/27/2021  ? K 3.7 08/27/2021  ? CL 102 08/27/2021  ? CO2 29 08/27/2021  ? ? ?Hyperlipidemia ?Lab Results  ?Component Value Date  ? CHOL 162 08/27/2021  ? CHOL 150 08/16/2020  ? CHOL 166 06/13/2019  ? ?Lab Results  ?Component Value Date  ? HDL 46.20 08/27/2021  ? HDL 41.70 08/16/2020  ? HDL 49.90 06/13/2019  ? ?Lab Results  ?Component Value Date  ? LDLCALC 98 08/27/2021  ? LDLCALC 85 08/16/2020  ? LDLCALC 91 06/13/2019  ? ?Lab Results   ?Component Value Date  ? TRIG 92.0 08/27/2021  ? TRIG 119.0 08/16/2020  ? TRIG 125.0 06/13/2019  ? ?Lab Results  ?Component Value Date  ? CHOLHDL 4 08/27/2021  ? CHOLHDL 4 08/16/2020  ? CHOLHDL 3 06/13/2019  ? ?Lab Results  ?Component Value Date  ? LDLDIRECT 179.6 03/02/2009  ? ?Simvastatin 10 mg daily  ?Eating is fair -she makes effort  ? ? ?Has been to dermatologist for basal cell skin cancer  ?Uses sun protection  ? ?Patient Active Problem List  ? Diagnosis Date Noted  ? Encounter for screening mammogram for breast cancer 09/02/2021  ? Screening mammogram, encounter for 06/03/2017  ? Second hand smoke exposure 01/09/2016  ? Estrogen deficiency 04/04/2015  ? Encounter for Medicare annual wellness exam 09/30/2013  ? Family history of aortic aneurysm 09/30/2013  ? Routine general medical examination at a health care facility 11/17/2011  ? Hypertension 09/24/2010  ? POSTMENOPAUSAL STATUS 06/24/2010  ? Vitamin D deficiency 03/02/2009  ? History of colon polyps - adenomas 12/17/2007  ? Hyperlipidemia 10/05/2007  ? SINUSITIS, RECURRENT 10/05/2007  ? ALLERGIC RHINITIS 10/05/2007  ? Osteopenia 10/05/2007  ? NEPHROLITHIASIS, HX OF 10/05/2007  ? ?Past Medical History:  ?Diagnosis Date  ? Allergy   ? allergic rhinitis  ? Arthritis   ? History  of colon polyps - adenomas 12/17/2007  ? Hyperlipidemia   ? Hypertension   ? Kidney stone   ? Nephrolithiasis   ? Hx of  ? Osteopenia   ? ?Past Surgical History:  ?Procedure Laterality Date  ? ABDOMINAL HYSTERECTOMY  04/21/1978  ? total for bleeding  ? CESAREAN SECTION  04/22/1967  ? COLONOSCOPY    ? DILATION AND CURETTAGE OF UTERUS    ? x2  ? EYE SURGERY  03/11/2021  ? eye surgey  02/18/2021  ? ?Social History  ? ?Tobacco Use  ? Smoking status: Never  ?  Passive exposure: Yes  ? Smokeless tobacco: Never  ? Tobacco comments:  ?  husband smokes  ?Vaping Use  ? Vaping Use: Never used  ?Substance Use Topics  ? Alcohol use: No  ?  Alcohol/week: 0.0 standard drinks  ? Drug use: No  ? ?Family  History  ?Problem Relation Age of Onset  ? Hypertension Mother   ? Osteoporosis Mother   ? Heart disease Father   ?     CABG,CAD,MI  ? Kidney failure Father   ? Osteoporosis Father   ?     ? of OP pt had hip fx.  ? Hypertension Brother   ? Colon cancer Neg Hx   ? Pancreatic cancer Neg Hx   ? Rectal cancer Neg Hx   ? Stomach cancer Neg Hx   ? Breast cancer Neg Hx   ? ?No Known Allergies ?Current Outpatient Medications on File Prior to Visit  ?Medication Sig Dispense Refill  ? albuterol (VENTOLIN HFA) 108 (90 Base) MCG/ACT inhaler INHALE 1 TO 2 PUFFS BY MOUTH EVERY 4 HOURS 6.7 g 1  ? aspirin 81 MG tablet Take 81 mg by mouth daily.    ? cholecalciferol (VITAMIN D) 1000 UNITS tablet Take 1,000 Units by mouth daily.    ? ?No current facility-administered medications on file prior to visit.  ?  ? ?Review of Systems  ?Constitutional:  Negative for activity change, appetite change, fatigue, fever and unexpected weight change.  ?HENT:  Negative for congestion, ear pain, rhinorrhea, sinus pressure and sore throat.   ?Eyes:  Negative for pain, redness and visual disturbance.  ?Respiratory:  Negative for cough, shortness of breath and wheezing.   ?Cardiovascular:  Negative for chest pain and palpitations.  ?Gastrointestinal:  Negative for abdominal pain, blood in stool, constipation and diarrhea.  ?Endocrine: Negative for polydipsia and polyuria.  ?Genitourinary:  Negative for dysuria, frequency and urgency.  ?Musculoskeletal:  Negative for arthralgias, back pain and myalgias.  ?Skin:  Negative for pallor and rash.  ?Allergic/Immunologic: Negative for environmental allergies.  ?Neurological:  Negative for dizziness, syncope and headaches.  ?Hematological:  Negative for adenopathy. Does not bruise/bleed easily.  ?Psychiatric/Behavioral:  Negative for decreased concentration and dysphoric mood. The patient is not nervous/anxious.   ? ?   ?Objective:  ? Physical Exam ?Constitutional:   ?   General: She is not in acute distress. ?    Appearance: Normal appearance. She is well-developed and normal weight. She is not ill-appearing or diaphoretic.  ?HENT:  ?   Head: Normocephalic and atraumatic.  ?   Right Ear: Tympanic membrane, ear canal and external ear normal.  ?   Left Ear: Tympanic membrane, ear canal and external ear normal.  ?   Nose: Nose normal. No congestion.  ?   Mouth/Throat:  ?   Mouth: Mucous membranes are moist.  ?   Pharynx: Oropharynx is clear. No posterior oropharyngeal erythema.  ?  Eyes:  ?   General: No scleral icterus. ?   Extraocular Movements: Extraocular movements intact.  ?   Conjunctiva/sclera: Conjunctivae normal.  ?   Pupils: Pupils are equal, round, and reactive to light.  ?Neck:  ?   Thyroid: No thyromegaly.  ?   Vascular: No carotid bruit or JVD.  ?Cardiovascular:  ?   Rate and Rhythm: Normal rate and regular rhythm.  ?   Pulses: Normal pulses.  ?   Heart sounds: Normal heart sounds.  ?  No gallop.  ?Pulmonary:  ?   Effort: Pulmonary effort is normal. No respiratory distress.  ?   Breath sounds: Normal breath sounds. No wheezing.  ?   Comments: Good air exch ?Chest:  ?   Chest wall: No tenderness.  ?Abdominal:  ?   General: Bowel sounds are normal. There is no distension or abdominal bruit.  ?   Palpations: Abdomen is soft. There is no mass.  ?   Tenderness: There is no abdominal tenderness.  ?   Hernia: No hernia is present.  ?Genitourinary: ?   Comments: Breast exam: No mass, nodules, thickening, tenderness, bulging, retraction, inflamation, nipple discharge or skin changes noted.  No axillary or clavicular LA.     ?Musculoskeletal:     ?   General: No tenderness. Normal range of motion.  ?   Cervical back: Normal range of motion and neck supple. No rigidity. No muscular tenderness.  ?   Right lower leg: No edema.  ?   Left lower leg: No edema.  ?   Comments: No kyphosis   ?Lymphadenopathy:  ?   Cervical: No cervical adenopathy.  ?Skin: ?   General: Skin is warm and dry.  ?   Coloration: Skin is not pale.  ?    Findings: No erythema or rash.  ?   Comments: Solar lentigines diffusely ?  ?Neurological:  ?   Mental Status: She is alert. Mental status is at baseline.  ?   Cranial Nerves: No cranial nerve deficit.  ?

## 2021-09-02 NOTE — Patient Instructions (Addendum)
If you bp gets too low (90/50 or less) on a regular basis or you feel dizzy let us know  ? ?Start exercising for bone health  ?Any weight bearing or resistance training is good  ? ?If you are interested in the new shingles vaccine (Shingrix) - call your local pharmacy to check on coverage and availability  ?If affordable, get on a wait list at your pharmacy to get the vaccine. ? ? ?Call and schedule your mammogram and bone density test at [facility] ? ?Please call the location of your choice from the menu below to schedule your Mammogram and/or Bone Density appointment.   ? ?[place]  ? ?[facility]                ?      Phone:  [phone] ?[address]                               ?[place], [place] [address]                                                             ?Services: Traditional and 3D Mammogram, Bone Density  ? ?[facility]           ?      Phone: [phone] ?[address]                                                       ?[place], [place] [address]    ?Service: Bone Density ONLY  ? *this site does NOT perform mammograms ? ?[facility]                       ? Phone:  [phone] ?[address]                                  ?[place], [place] [address]                                            ?Services:  3D Mammogram and Bone Density  ? ? ?[place] ? ?[facility]   ?Phone:  [phone]   ?[address]                                                                            ?[place], [place] [address]                                            ?  Services: 3D Mammogram and Bone Density ? ?[facility]  ?Phone:  [phone]   ?[address]                        ?[place], [place] [address]                                              ?Services:  3D Mammogram and Bone Density ? ? ? ?

## 2021-09-02 NOTE — Assessment & Plan Note (Signed)
D level 41.4 ?Vitamin D level is therapeutic with current supplementation ?Disc importance of this to bone and overall health ? ?

## 2021-09-02 NOTE — Assessment & Plan Note (Signed)
Reviewed health habits including diet and exercise and skin cancer prevention ?Reviewed appropriate screening tests for age  ?Also reviewed health mt list, fam hx and immunization status , as well as social and family history   ?See HPI ?Interested in shingrix,will check on coverage ?Mammogram ordered ?dexa ordered  ?No falls or fx  ?cologuard neg 09/2020  ?

## 2021-09-02 NOTE — Assessment & Plan Note (Signed)
bp in fair control at this time  ?BP Readings from Last 1 Encounters:  ?09/02/21 132/80  ? ?No changes needed ?Most recent labs reviewed  ?Disc lifstyle change with low sodium diet and exercise  ?Plan to continue lisinopril hct 20-12.5 mg 1/2 daily  ?If low bp inst to hold and call ?

## 2021-09-20 ENCOUNTER — Other Ambulatory Visit: Payer: Self-pay | Admitting: Family Medicine

## 2021-12-02 ENCOUNTER — Ambulatory Visit
Admission: RE | Admit: 2021-12-02 | Discharge: 2021-12-02 | Disposition: A | Discharge status: Home / Self Care | Payer: Medicare Other | Source: Ambulatory Visit | Attending: Family Medicine | Admitting: Family Medicine

## 2021-12-02 DIAGNOSIS — Z1231 Encounter for screening mammogram for malignant neoplasm of breast: Secondary | ICD-10-CM | POA: Insufficient documentation

## 2021-12-02 DIAGNOSIS — E2839 Other primary ovarian failure: Secondary | ICD-10-CM | POA: Insufficient documentation

## 2021-12-17 ENCOUNTER — Ambulatory Visit (INDEPENDENT_AMBULATORY_CARE_PROVIDER_SITE_OTHER): Payer: Medicare Other | Admitting: Family Medicine

## 2021-12-17 ENCOUNTER — Encounter: Payer: Self-pay | Admitting: Family Medicine

## 2021-12-17 VITALS — BP 135/80 | HR 72 | Temp 97.8°F | Ht 65.5 in | Wt 142.1 lb

## 2021-12-17 DIAGNOSIS — J301 Allergic rhinitis due to pollen: Secondary | ICD-10-CM | POA: Diagnosis not present

## 2021-12-17 DIAGNOSIS — E559 Vitamin D deficiency, unspecified: Secondary | ICD-10-CM

## 2021-12-17 DIAGNOSIS — L299 Pruritus, unspecified: Secondary | ICD-10-CM | POA: Diagnosis not present

## 2021-12-17 DIAGNOSIS — M810 Age-related osteoporosis without current pathological fracture: Secondary | ICD-10-CM

## 2021-12-17 MED ORDER — ALENDRONATE SODIUM 70 MG PO TABS: 70.0000 mg | ORAL_TABLET | ORAL | 0 refills | Status: DC

## 2021-12-17 NOTE — Assessment & Plan Note (Signed)
Worsened OP Reviewed dexa   Plan made to try alendronate weekly  Rev poss side eff Rev way to take  If tolerated- 5 y course  Continue ca and [employer] wt bearing exercise  Fall prevention rev

## 2021-12-17 NOTE — Assessment & Plan Note (Signed)
Worse on L  Nl exam  Disc use of oil for dry skin  If not imp can try low pot steroid cream   For ETD- steroid ns prn in season  Update if not starting to improve in a week or if worsening

## 2021-12-17 NOTE — Patient Instructions (Addendum)
Any type of resistance training is good for your bones Weight bearing is great  Walking is great  Exercise bands or weights are good  Going to a gym would be great   Some oil like sweet oil is helpful in ear canal (just a drop) for dry skin  If itching worsens we can use a cortisone cream  For allergies- flonase can help nasal and ear symptoms (it is over the counter)   Start alendronate Take as directed weekly  We will do 5 years it if works out  If any side effects (like heartburn) stop it and let us know

## 2021-12-17 NOTE — Progress Notes (Signed)
Subjective:    Patient ID: Colleen Cochran, female    DOB: 08/31/1945, 76 y.o.   MRN: [id number]  HPI Pt presents to discuss dexa result and with L ear complaint  Wt Readings from Last 3 Encounters:  12/17/21 142 lb 2 oz (64.5 kg)  09/02/21 142 lb (64.4 kg)  08/26/21 140 lb (63.5 kg)   23.29 kg/m  Dexa  AP spine T score -2.6 Femur left -2.7  Supplements Falls -none recent  Fractures -none recent   She was px alendronate in the past - never filled it    She is busy and walks to her mailbox for exercise  Some housework   Lab Results  Component Value Date   CREATININE 0.68 08/27/2021   BUN 19 08/27/2021   NA 140 08/27/2021   K 3.7 08/27/2021   CL 102 08/27/2021   CO2 29 08/27/2021   Lab Results  Component Value Date   CALCIUM 9.4 08/27/2021   Vit D 41.4 Taking vit D3 1000 iu daily   No dental problems  No upcoming dental work    Ears itch at times  L one itched and she scratched in canal and scabbed Is better now  Some days feels stopped up   Some nasal symptoms occ from allergies  Patient Active Problem List   Diagnosis Date Noted   Ear itching 12/17/2021   Encounter for screening mammogram for breast cancer 09/02/2021   Screening mammogram, encounter for 06/03/2017   Second hand smoke exposure 01/09/2016   Estrogen deficiency 04/04/2015   Encounter for Medicare annual wellness exam 09/30/2013   Family history of aortic aneurysm 09/30/2013   Routine general medical examination at a health care facility 11/17/2011   Hypertension 09/24/2010   POSTMENOPAUSAL STATUS 06/24/2010   Vitamin D deficiency 03/02/2009   History of colon polyps - adenomas 12/17/2007   Hyperlipidemia 10/05/2007   SINUSITIS, RECURRENT 10/05/2007   Allergic rhinitis 10/05/2007   Osteoporosis 10/05/2007   NEPHROLITHIASIS, HX OF 10/05/2007   Past Medical History:  Diagnosis Date   Allergy    allergic rhinitis   Arthritis    History of colon polyps - adenomas 12/17/2007    Hyperlipidemia    Hypertension    Kidney stone    Nephrolithiasis    Hx of   Osteopenia    Past Surgical History:  Procedure Laterality Date   ABDOMINAL HYSTERECTOMY  04/21/1978   total for bleeding   CESAREAN SECTION  04/22/1967   COLONOSCOPY     DILATION AND CURETTAGE OF UTERUS     x2   EYE SURGERY  03/11/2021   eye surgey  02/18/2021   Social History   Tobacco Use   Smoking status: Never    Passive exposure: Yes   Smokeless tobacco: Never   Tobacco comments:    husband smokes  Vaping Use   Vaping Use: Never used  Substance Use Topics   Alcohol use: No    Alcohol/week: 0.0 standard drinks of alcohol   Drug use: No   Family History  Problem Relation Age of Onset   Hypertension Mother    Osteoporosis Mother    Heart disease Father        CABG,CAD,MI   Kidney failure Father    Osteoporosis Father        ? of OP pt had hip fx.   Hypertension Brother    Colon cancer Neg Hx    Pancreatic cancer Neg Hx    Rectal cancer Neg Hx  Stomach cancer Neg Hx    Breast cancer Neg Hx    No Known Allergies Current Outpatient Medications on File Prior to Visit  Medication Sig Dispense Refill   albuterol (VENTOLIN HFA) 108 (90 Base) MCG/ACT inhaler INHALE 1 TO 2 PUFFS BY MOUTH EVERY 4 HOURS 6.7 g 1   aspirin 81 MG tablet Take 81 mg by mouth daily.     cholecalciferol (VITAMIN D) 1000 UNITS tablet Take 1,000 Units by mouth daily.     lisinopril-hydrochlorothiazide (ZESTORETIC) 20-12.5 MG tablet Take 0.5 tablets by mouth daily. 45 tablet 11   simvastatin (ZOCOR) 10 MG tablet TAKE 1 TABLET(10 MG) BY MOUTH DAILY 30 tablet 11   No current facility-administered medications on file prior to visit.      Review of Systems  Constitutional:  Negative for activity change, appetite change, fatigue, fever and unexpected weight change.  HENT:  Positive for congestion. Negative for ear discharge, ear pain, rhinorrhea, sinus pressure and sore throat.        Ear itching  Occ fullness   Eyes:  Negative for pain, redness and visual disturbance.  Respiratory:  Negative for cough, shortness of breath and wheezing.   Cardiovascular:  Negative for chest pain and palpitations.  Gastrointestinal:  Negative for abdominal pain, blood in stool, constipation and diarrhea.  Endocrine: Negative for polydipsia and polyuria.  Genitourinary:  Negative for dysuria, frequency and urgency.  Musculoskeletal:  Negative for arthralgias, back pain and myalgias.  Skin:  Negative for pallor and rash.  Allergic/Immunologic: Negative for environmental allergies.  Neurological:  Negative for dizziness, syncope and headaches.  Hematological:  Negative for adenopathy. Does not bruise/bleed easily.  Psychiatric/Behavioral:  Negative for decreased concentration and dysphoric mood. The patient is not nervous/anxious.        Objective:   Physical Exam Constitutional:      General: She is not in acute distress.    Appearance: Normal appearance. She is well-developed and normal weight.  HENT:     Head: Normocephalic and atraumatic.     Right Ear: Tympanic membrane, ear canal and external ear normal. There is no impacted cerumen.     Left Ear: Tympanic membrane, ear canal and external ear normal. There is no impacted cerumen.     Ears:     Comments: Nl ear exam  TMs look normal today    Nose:     Comments: Boggy nares  Eyes:     Conjunctiva/sclera: Conjunctivae normal.     Pupils: Pupils are equal, round, and reactive to light.  Neck:     Thyroid: No thyromegaly.     Vascular: No carotid bruit or JVD.  Cardiovascular:     Rate and Rhythm: Normal rate and regular rhythm.     Heart sounds: Normal heart sounds.     No gallop.  Pulmonary:     Effort: Pulmonary effort is normal. No respiratory distress.     Breath sounds: Normal breath sounds. No wheezing or rales.  Abdominal:     General: There is no distension or abdominal bruit.     Palpations: Abdomen is soft.  Musculoskeletal:      Cervical back: Normal range of motion and neck supple.     Right lower leg: No edema.     Left lower leg: No edema.     Comments: No kyphosis - posture is fairly good for age  Petite frame   Lymphadenopathy:     Cervical: No cervical adenopathy.  Skin:    General:  Skin is warm and dry.     Coloration: Skin is not jaundiced or pale.     Findings: No bruising or rash.  Neurological:     Mental Status: She is alert.     Coordination: Coordination normal.     Deep Tendon Reflexes: Reflexes are normal and symmetric. Reflexes normal.  Psychiatric:        Mood and Affect: Mood normal.           Assessment & Plan:   Problem List Items Addressed This Visit       Respiratory   Allergic rhinitis    inst to try flonase or other steroid ns prn in allergy season if ears feel clogged         Musculoskeletal and Integument   Osteoporosis - Primary    Worsened OP Reviewed dexa   Plan made to try alendronate weekly  Rev poss side eff Rev way to take  If tolerated- 5 y course  Continue ca and [employer] wt bearing exercise  Fall prevention rev        Relevant Medications   alendronate (FOSAMAX) 70 MG tablet     Other   Ear itching    Worse on L  Nl exam  Disc use of oil for dry skin  If not imp can try low pot steroid cream   For ETD- steroid ns prn in season  Update if not starting to improve in a week or if worsening        Vitamin D deficiency    D level 41.4 last Nl ca   Vitamin D level is therapeutic with current supplementation Disc importance of this to bone and overall health Plans to continue 1000 mcg D3 daily

## 2021-12-17 NOTE — Assessment & Plan Note (Signed)
D level 41.4 last Nl ca   Vitamin D level is therapeutic with current supplementation Disc importance of this to bone and overall health Plans to continue 1000 mcg D3 daily

## 2021-12-17 NOTE — Assessment & Plan Note (Signed)
inst to try flonase or other steroid ns prn in allergy season if ears feel clogged

## 2022-01-17 ENCOUNTER — Telehealth: Payer: Self-pay | Admitting: Family Medicine

## 2022-01-17 MED ORDER — ALENDRONATE SODIUM 70 MG PO TABS: 70.0000 mg | ORAL_TABLET | ORAL | 2 refills | Status: DC

## 2022-01-17 NOTE — Telephone Encounter (Signed)
  Encourage patient to contact the pharmacy for refills or they can request refills through [facility]  Did the patient contact the pharmacy:  yes   LAST APPOINTMENT DATE:  Please schedule appointment if longer than 1 year  NEXT APPOINTMENT DATE:08/28/2022  MEDICATION:alendronate (FOSAMAX) 70 MG tablet  Is the patient out of medication? yes  If not, how much is left?  Is this a 90 day supply: no  PHARMACY: [id number] - [address] [place] [address] Phone:  [phone]  Fax:  [phone]      Let patient know to contact pharmacy at the end of the day to make sure medication is ready.  Please notify patient to allow 48-72 hours to process

## 2022-03-18 ENCOUNTER — Telehealth: Payer: Self-pay | Admitting: Family Medicine

## 2022-03-18 NOTE — Telephone Encounter (Signed)
Patient called and stated she is having sinus issues and stated she get them every year and has took over the counter medication for about a week and wanted to know if  Dr. Milinda Antis could call her something in. Call back number [phone]

## 2022-03-18 NOTE — Telephone Encounter (Signed)
We don't send in meds without an appt., please schedule pt appt with any available provider

## 2022-03-20 ENCOUNTER — Encounter: Payer: Self-pay | Admitting: Primary Care

## 2022-03-20 ENCOUNTER — Ambulatory Visit (INDEPENDENT_AMBULATORY_CARE_PROVIDER_SITE_OTHER): Payer: Medicare Other | Admitting: Primary Care

## 2022-03-20 VITALS — BP 128/80 | HR 84 | Temp 97.8°F | Ht 65.5 in | Wt 142.5 lb

## 2022-03-20 DIAGNOSIS — J0110 Acute frontal sinusitis, unspecified: Secondary | ICD-10-CM | POA: Diagnosis not present

## 2022-03-20 MED ORDER — AMOXICILLIN-POT CLAVULANATE 875-125 MG PO TABS: 1.0000 | ORAL_TABLET | Freq: Two times a day (BID) | ORAL | 0 refills | Status: DC

## 2022-03-20 NOTE — Patient Instructions (Signed)
Start Augmentin antibiotics for the infection Take 1 tablet by mouth twice daily for 7 days.  Continue Flonase.  It was a pleasure to see you today!

## 2022-03-20 NOTE — Assessment & Plan Note (Signed)
Symptoms suspicious for sinusitis. Given duration of symptoms, coupled with presentation, will treat for presumed bacterial involvement.   Start Augmentin antibiotics for the infection Take 1 tablet by mouth twice daily for 7 days. Continue Flonase.   Return precautions provided.

## 2022-03-20 NOTE — Progress Notes (Signed)
Subjective:    Patient ID: Colleen Cochran, female    DOB: 1945-05-24, 76 y.o.   MRN: [id number]  Sinusitis Associated symptoms include congestion, coughing and sinus pressure. Pertinent negatives include no ear pain or sore throat.    Colleen Cochran is a very pleasant 76 y.o. female patient of Dr. Milinda Antis with a history of recurrent sinusitis, allergic rhinitis, hypertension managed on ACE-I who presents today to discuss sinus pressure.   Symptom onset 7-8 days ago with bilateral frontal sinus pressure and frontal headache. She then developed maxillary facial tenderness bilaterally, dental pain, fevers, pressure behind her eyes with burning.   She's been taking an OTC sinus pill and using Flonase with temporary improvement. Her last fever was yesterday at 99.3, t-max was 100.3. She's taken two Covid-19 tests recently, both of which were negative. Symptoms will wax and wane, she doesn't feel as though her symptoms will resolve without intervention.    Review of Systems  Constitutional:  Positive for fatigue and fever.  HENT:  Positive for congestion, sinus pressure and sinus pain. Negative for ear pain, postnasal drip and sore throat.   Respiratory:  Positive for cough.          Past Medical History:  Diagnosis Date   Allergy    allergic rhinitis   Arthritis    History of colon polyps - adenomas 12/17/2007   Hyperlipidemia    Hypertension    Kidney stone    Nephrolithiasis    Hx of   Osteopenia     Social History   Socioeconomic History   Marital status: Married    Spouse name: Dwane   Number of children: 1   Years of education: Not on file   Highest education level: Not on file  Occupational History   Not on file  Tobacco Use   Smoking status: Never    Passive exposure: Yes   Smokeless tobacco: Never   Tobacco comments:    husband smokes  Vaping Use   Vaping Use: Never used  Substance and Sexual Activity   Alcohol use: No    Alcohol/week: 0.0 standard drinks  of alcohol   Drug use: No   Sexual activity: Never  Other Topics Concern   Not on file  Social History Narrative   7 grandchildren.   2 great grandchildren.   Married x 54 years in 2023.   Social Determinants of Health   Financial Resource Strain: Low Risk  (08/26/2021)   Overall Financial Resource Strain (CARDIA)    Difficulty of Paying Living Expenses: Not hard at all  Food Insecurity: No Food Insecurity (08/26/2021)   Hunger Vital Sign    Worried About Running Out of Food in the Last Year: Never true    Ran Out of Food in the Last Year: Never true  Transportation Needs: No Transportation Needs (08/26/2021)   PRAPARE - Administrator, Civil Service (Medical): No    Lack of Transportation (Non-Medical): No  Physical Activity: Insufficiently Active (08/26/2021)   Exercise Vital Sign    Days of Exercise per Week: 5 days    Minutes of Exercise per Session: 10 min  Stress: No Stress Concern Present (08/26/2021)   [employer] of Occupational Health - Occupational Stress Questionnaire    Feeling of Stress : Not at all  Social Connections: Socially Integrated (08/26/2021)   Social Connection and Isolation Panel [NHANES]    Frequency of Communication with Friends and Family: More than three times a week  Frequency of Social Gatherings with Friends and Family: More than three times a week    Attends Religious Services: More than 4 times per year    Active Member of [employer] or Organizations: Yes    Attends Banker Meetings: More than 4 times per year    Marital Status: Married  Catering manager Violence: Not At Risk (08/26/2021)   Humiliation, Afraid, Rape, and Kick questionnaire    Fear of Current or Ex-Partner: No    Emotionally Abused: No    Physically Abused: No    Sexually Abused: No    Past Surgical History:  Procedure Laterality Date   ABDOMINAL HYSTERECTOMY  04/21/1978   total for bleeding   CESAREAN SECTION  04/22/1967   COLONOSCOPY     DILATION AND  CURETTAGE OF UTERUS     x2   EYE SURGERY  03/11/2021   eye surgey  02/18/2021    Family History  Problem Relation Age of Onset   Hypertension Mother    Osteoporosis Mother    Heart disease Father        CABG,CAD,MI   Kidney failure Father    Osteoporosis Father        ? of OP pt had hip fx.   Hypertension Brother    Colon cancer Neg Hx    Pancreatic cancer Neg Hx    Rectal cancer Neg Hx    Stomach cancer Neg Hx    Breast cancer Neg Hx     No Known Allergies  Current Outpatient Medications on File Prior to Visit  Medication Sig Dispense Refill   albuterol (VENTOLIN HFA) 108 (90 Base) MCG/ACT inhaler INHALE 1 TO 2 PUFFS BY MOUTH EVERY 4 HOURS 6.7 g 1   aspirin 81 MG tablet Take 81 mg by mouth daily.     cholecalciferol (VITAMIN D) 1000 UNITS tablet Take 1,000 Units by mouth daily.     lisinopril-hydrochlorothiazide (ZESTORETIC) 20-12.5 MG tablet Take 0.5 tablets by mouth daily. 45 tablet 11   simvastatin (ZOCOR) 10 MG tablet TAKE 1 TABLET(10 MG) BY MOUTH DAILY 30 tablet 11   alendronate (FOSAMAX) 70 MG tablet Take 1 tablet (70 mg total) by mouth every 7 (seven) days. Take with a full glass of water on an empty stomach. (Patient not taking: Reported on 03/20/2022) 12 tablet 2   No current facility-administered medications on file prior to visit.    BP 128/80 (BP Location: Right Arm, Patient Position: Sitting, Cuff Size: Normal)   Pulse 84   Temp 97.8 F (36.6 C) (Temporal)   Ht 5' 5.5" (1.664 m)   Wt 142 lb 8 oz (64.6 kg)   SpO2 95%   BMI 23.35 kg/m  Objective:   Physical Exam HENT:     Right Ear: Tympanic membrane and ear canal normal.     Left Ear: Tympanic membrane and ear canal normal.     Nose:     Right Turbinates: Not enlarged.     Left Turbinates: Not enlarged.     Right Sinus: Maxillary sinus tenderness and frontal sinus tenderness present.     Left Sinus: Maxillary sinus tenderness and frontal sinus tenderness present.     Mouth/Throat:     Pharynx: No  pharyngeal swelling or posterior oropharyngeal erythema.  Eyes:     Conjunctiva/sclera: Conjunctivae normal.  Cardiovascular:     Rate and Rhythm: Normal rate and regular rhythm.  Pulmonary:     Effort: Pulmonary effort is normal.     Breath sounds:  Normal breath sounds. No wheezing or rales.  Musculoskeletal:     Cervical back: Neck supple.  Lymphadenopathy:     Cervical: No cervical adenopathy.  Skin:    General: Skin is warm and dry.           Assessment & Plan:   Problem List Items Addressed This Visit       Respiratory   Acute non-recurrent frontal sinusitis - Primary    Symptoms suspicious for sinusitis. Given duration of symptoms, coupled with presentation, will treat for presumed bacterial involvement.   Start Augmentin antibiotics for the infection Take 1 tablet by mouth twice daily for 7 days. Continue Flonase.   Return precautions provided.       Relevant Medications   amoxicillin-clavulanate (AUGMENTIN) 875-125 MG tablet       Doreene Nest, NP

## 2022-04-09 ENCOUNTER — Telehealth: Payer: Self-pay | Admitting: Family Medicine

## 2022-04-09 NOTE — Telephone Encounter (Signed)
  Encourage patient to contact the pharmacy for refills or they can request refills through [facility]  Did the patient contact the pharmacy: yes    LAST APPOINTMENT DATE:  Please schedule appointment if longer than 1 year 03/20/2022 NEXT APPOINTMENT DATE:  MEDICATION:amoxicillin-clavulanate amoxicillin-clavulanate (AUGMENTIN) 875-125 MG tablet  Is the patient out of medication? yes  If not, how much is left?  Is this a 90 day supply: no  PHARMACY: [id number] - [place], [place] - [address] AT [facility] [address] Phone: [phone]  Fax: [phone]      Let patient know to contact pharmacy at the end of the day to make sure medication is ready.  Please notify patient to allow 48-72 hours to process

## 2022-04-09 NOTE — Telephone Encounter (Signed)
We do not refill antibiotics if pt is sick she needs an appt scheduled

## 2022-04-09 NOTE — Telephone Encounter (Signed)
Spoke to pt's husband, he said he'd give the pt the message.

## 2022-04-10 ENCOUNTER — Ambulatory Visit (INDEPENDENT_AMBULATORY_CARE_PROVIDER_SITE_OTHER): Payer: Medicare Other | Admitting: Primary Care

## 2022-04-10 ENCOUNTER — Encounter: Payer: Self-pay | Admitting: Primary Care

## 2022-04-10 VITALS — BP 144/84 | HR 96 | Temp 98.6°F

## 2022-04-10 DIAGNOSIS — J3489 Other specified disorders of nose and nasal sinuses: Secondary | ICD-10-CM | POA: Insufficient documentation

## 2022-04-10 MED ORDER — PREDNISONE 20 MG PO TABS: ORAL_TABLET | ORAL | 0 refills | Status: DC

## 2022-04-10 NOTE — Patient Instructions (Signed)
Start prednisone 20 mg tablets. Take 2 tablets by mouth once daily in the morning for 5 days.  Stop using the Sinus pill from [facility].   Please update me on Tuesday next week.  It was a pleasure to see you today!

## 2022-04-10 NOTE — Assessment & Plan Note (Signed)
Doubt bacterial sinusitis at this point, especially given 7 day prior course of Augmentin. No alarm signs on exam. Symptoms today seem more like allergies.   Will treat with conservative measures.  Start prednisone 20 mg tablets. Take 2 tablets by mouth once daily in the morning for 5 days.  Continue Ibuprofen or Tylenol.  I have asked her to update early next week. She agrees.

## 2022-04-10 NOTE — Progress Notes (Signed)
Subjective:    Patient ID: [name], female    DOB: Feb 05, 1946, 76 y.o.   MRN: [id number]  HPI  Colleen Cochran is a very pleasant 76 y.o. female with a history of hypertension managed on ACE-I, allergic rhinitis, fontal sinusitis who presents today to discuss facial pressure.  Evaluated by me on 03/20/22 for a 7-8 day history of bilateral frontal sinus pressure with bilateral maxillary facial tenderness, dental pain.  Symptoms suspicious for acute sinusitis so she was treated with Augmentin BID x 7 days.   Since her last visit she completed her Augmentin course, symptoms resolved completley. Two days ago she began to feel bilateral frontal lobe pressure, maxillary pressure, scratchy throat, post nasal drip with cough at night, ears itching.  She's been using OTC Sinus from [facility], Flonase, and Ibuprofen. She denies fevers. She took two home Covid-19 tests and both were negative.   Review of Systems  Constitutional:  Negative for chills, fatigue and fever.  HENT:  Positive for postnasal drip and sinus pressure.        Itchy ears  Respiratory:  Positive for cough.          Past Medical History:  Diagnosis Date   Allergy    allergic rhinitis   Arthritis    History of colon polyps - adenomas 12/17/2007   Hyperlipidemia    Hypertension    Kidney stone    Nephrolithiasis    Hx of   Osteopenia     Social History   Socioeconomic History   Marital status: Married    Spouse name: Dwane   Number of children: 1   Years of education: Not on file   Highest education level: Not on file  Occupational History   Not on file  Tobacco Use   Smoking status: Never    Passive exposure: Yes   Smokeless tobacco: Never   Tobacco comments:    husband smokes  Vaping Use   Vaping Use: Never used  Substance and Sexual Activity   Alcohol use: No    Alcohol/week: 0.0 standard drinks of alcohol   Drug use: No   Sexual activity: Never  Other Topics Concern   Not on file   Social History Narrative   7 grandchildren.   2 great grandchildren.   Married x 54 years in 2023.   Social Determinants of Health   Financial Resource Strain: Low Risk  (08/26/2021)   Overall Financial Resource Strain (CARDIA)    Difficulty of Paying Living Expenses: Not hard at all  Food Insecurity: No Food Insecurity (08/26/2021)   Hunger Vital Sign    Worried About Running Out of Food in the Last Year: Never true    Ran Out of Food in the Last Year: Never true  Transportation Needs: No Transportation Needs (08/26/2021)   PRAPARE - Administrator, Civil Service (Medical): No    Lack of Transportation (Non-Medical): No  Physical Activity: Insufficiently Active (08/26/2021)   Exercise Vital Sign    Days of Exercise per Week: 5 days    Minutes of Exercise per Session: 10 min  Stress: No Stress Concern Present (08/26/2021)   [employer] of Occupational Health - Occupational Stress Questionnaire    Feeling of Stress : Not at all  Social Connections: Socially Integrated (08/26/2021)   Social Connection and Isolation Panel [NHANES]    Frequency of Communication with Friends and Family: More than three times a week    Frequency of Social Gatherings  with Friends and Family: More than three times a week    Attends Religious Services: More than 4 times per year    Active Member of Clubs or Organizations: Yes    Attends Banker Meetings: More than 4 times per year    Marital Status: Married  Catering manager Violence: Not At Risk (08/26/2021)   Humiliation, Afraid, Rape, and Kick questionnaire    Fear of Current or Ex-Partner: No    Emotionally Abused: No    Physically Abused: No    Sexually Abused: No    Past Surgical History:  Procedure Laterality Date   ABDOMINAL HYSTERECTOMY  04/21/1978   total for bleeding   CESAREAN SECTION  04/22/1967   COLONOSCOPY     DILATION AND CURETTAGE OF UTERUS     x2   EYE SURGERY  03/11/2021   eye surgey  02/18/2021     Family History  Problem Relation Age of Onset   Hypertension Mother    Osteoporosis Mother    Heart disease Father        CABG,CAD,MI   Kidney failure Father    Osteoporosis Father        ? of OP pt had hip fx.   Hypertension Brother    Colon cancer Neg Hx    Pancreatic cancer Neg Hx    Rectal cancer Neg Hx    Stomach cancer Neg Hx    Breast cancer Neg Hx     No Known Allergies  Current Outpatient Medications on File Prior to Visit  Medication Sig Dispense Refill   albuterol (VENTOLIN HFA) 108 (90 Base) MCG/ACT inhaler INHALE 1 TO 2 PUFFS BY MOUTH EVERY 4 HOURS 6.7 g 1   aspirin 81 MG tablet Take 81 mg by mouth daily.     cholecalciferol (VITAMIN D) 1000 UNITS tablet Take 1,000 Units by mouth daily.     lisinopril-hydrochlorothiazide (ZESTORETIC) 20-12.5 MG tablet Take 0.5 tablets by mouth daily. 45 tablet 11   simvastatin (ZOCOR) 10 MG tablet TAKE 1 TABLET(10 MG) BY MOUTH DAILY 30 tablet 11   alendronate (FOSAMAX) 70 MG tablet Take 1 tablet (70 mg total) by mouth every 7 (seven) days. Take with a full glass of water on an empty stomach. (Patient not taking: Reported on 03/20/2022) 12 tablet 2   amoxicillin-clavulanate (AUGMENTIN) 875-125 MG tablet Take 1 tablet by mouth 2 (two) times daily. (Patient not taking: Reported on 04/10/2022) 14 tablet 0   No current facility-administered medications on file prior to visit.    BP (!) 144/84   Pulse 96   Temp 98.6 F (37 C) (Oral)   SpO2 96%  Objective:   Physical Exam Constitutional:      Appearance: She is not ill-appearing.  HENT:     Right Ear: Tympanic membrane and ear canal normal.     Left Ear: Tympanic membrane and ear canal normal.     Nose:     Right Sinus: No maxillary sinus tenderness or frontal sinus tenderness.     Left Sinus: No maxillary sinus tenderness or frontal sinus tenderness.     Mouth/Throat:     Pharynx: No posterior oropharyngeal erythema.  Eyes:     Conjunctiva/sclera: Conjunctivae normal.   Cardiovascular:     Rate and Rhythm: Normal rate and regular rhythm.  Pulmonary:     Effort: Pulmonary effort is normal.     Breath sounds: Normal breath sounds. No wheezing or rales.  Musculoskeletal:     Cervical back: Neck  supple.  Lymphadenopathy:     Cervical: No cervical adenopathy.  Skin:    General: Skin is warm and dry.           Assessment & Plan:   Problem List Items Addressed This Visit       Other   Sinus pressure - Primary    Doubt bacterial sinusitis at this point, especially given 7 day prior course of Augmentin. No alarm signs on exam. Symptoms today seem more like allergies.   Will treat with conservative measures.  Start prednisone 20 mg tablets. Take 2 tablets by mouth once daily in the morning for 5 days.  Continue Ibuprofen or Tylenol.  I have asked her to update early next week. She agrees.       Relevant Medications   predniSONE (DELTASONE) 20 MG tablet       Doreene Nest, NP

## 2022-04-15 ENCOUNTER — Telehealth: Payer: Self-pay | Admitting: Family Medicine

## 2022-04-15 NOTE — Telephone Encounter (Signed)
Glad to know!

## 2022-04-15 NOTE — Telephone Encounter (Signed)
Patient called in to let Jae Dire know that she is feeling better

## 2022-06-03 ENCOUNTER — Telehealth: Payer: Self-pay

## 2022-06-03 NOTE — Telephone Encounter (Signed)
I will see her then  

## 2022-06-03 NOTE — Telephone Encounter (Signed)
I spoke with pt; pt said symptoms started on 05/31/22 in the evening; pt started with H/A now pain level is 8. Dry cough, sneezing and fever from 100.3 - 99. No CP,SOB or wheezing but pt does think needs in office visit. Pt scheduled appt with Dr Glori Bickers on 06/04/22 at 12 noon. + covid test on 06/02/22.Marland Kitchen UC & ED precautions given and pt voiced understanding.pt is going to drink plenty of fluids, rest and self quarantine. Sending note to DR [employer] and [employer].

## 2022-06-03 NOTE — Telephone Encounter (Signed)
[facility] Night - Client TELEPHONE ADVICE RECORD [facility] Patient Name: Colleen Cochran [facility] Gender: Female DOB: 01/10/1946 Age: 77 Y 29 M 56 D Return Phone Number: [phone] (Primary), [phone] (Secondary) Address: [place]/ [place]/ [place] [address] Client [facility] [place] Night - Client Client Site [place] Provider Glori Bickers, Roque Lias - MD Contact Type Call Who Is Calling Patient / Member / Family / Caregiver Call Type Triage / Clinical Relationship To Patient Self Return Phone Number [phone] (Secondary) Chief Complaint Headache Reason for Call Symptomatic / Request for Health Information Initial Comment Patient tested positive for COVID, she is experiencing sneezing, headaches, and a congestion. Translation No Nurse Assessment Nurse: [place], RN, Estill Bamberg Date/Time (Eastern Time): 06/03/2022 8:41:21 AM Confirm and document reason for call. If symptomatic, describe symptoms. ---caller states she tested positive for COVID, she is experiencing sneezing, headaches, and a congestion. symptoms started sunday. temp 100.2 Does the patient have any new or worsening symptoms? ---Yes Will a triage be completed? ---Yes Related visit to physician within the last 2 weeks? ---No Does the PT have any chronic conditions? (i.e. diabetes, asthma, this includes High risk factors for pregnancy, etc.) ---Yes List chronic conditions. ---HTN Is this a behavioral health or substance abuse call? ---No Guidelines Guideline Title Affirmed Question Affirmed Notes Nurse Date/Time (Eastern Time) COVID-19 - Diagnosed or Suspected [1] HIGH RISK patient (e.g., weak immune system, age > 11 years, obesity with BMI 30 or higher, pregnant, chronic lung disease or other chronic medical condition) AND [2] COVID Humfleet, RN, Estill Bamberg 06/03/2022 8:42:43 AM PLEASE NOTE: All timestamps contained within this report  are represented as [place] Standard Time. CONFIDENTIALTY NOTICE: This fax transmission is intended only for the addressee. It contains information that is legally privileged, confidential or otherwise protected from use or disclosure. If you are not the intended recipient, you are strictly prohibited from reviewing, disclosing, copying using or disseminating any of this information or taking any action in reliance on or regarding this information. If you have received this fax in error, please notify us immediately by telephone so that we can arrange for its return to [place]. Phone: [phone], Toll-Free: [phone], Fax: [phone] Page: 2 of 2 Call Id: [id number] Guidelines Guideline Title Affirmed Question Affirmed Notes Nurse Date/Time Eilene Ghazi Time) symptoms (e.g., cough, fever) (Exceptions: Already seen by PCP and no new or worsening symptoms.) Disp. Time Eilene Ghazi Time) Disposition Final User 06/03/2022 8:15:43 AM Attempt made - no message left Humfleet, RN, Estill Bamberg 06/03/2022 8:46:28 AM Call PCP within 24 Hours Yes Humfleet, RN, Estill Bamberg Final Disposition 06/03/2022 8:46:28 AM Call PCP within 24 Hours Yes Humfleet, RN, Shelly Coss Disagree/Comply Comply Caller Understands Yes PreDisposition Did not know what to do Care Advice Given Per Guideline * You need to discuss this with your doctor (or NP/PA) within the next 24 hours. CALL PCP WITHIN 24 HOURS: * IF OFFICE WILL BE OPEN: Call the office when it opens tomorrow morning. CARE ADVICE given per COVID-19 - DIAGNOSED OR SUSPECTED (Adult) guideline. * You become worse CALL BACK IF: * IBUPROFEN (E.G., MOTRIN, ADVIL): Take 400 mg (two 200 mg pills) by mouth every 6 hours. The most you should take is 6 pills a day (1,200 mg total). * ACETAMINOPHEN - EXTRA STRENGTH TYLENOL: Take 1,000 mg (two 500 mg pills) every 6 to 8 hours as needed. Each Extra Strength Tylenol pill has 500 mg of acetaminophen. The most you should take is 6 pills a day  (3,000  mg total). Note: In [place], the maximum is 8 pills a day (4,000 mg total). * ACETAMINOPHEN - REGULAR STRENGTH TYLENOL: Take 650 mg (two 325 mg pills) by mouth every 4 to 6 hours as needed. Each Regular Strength Tylenol pill has 325 mg of acetaminophen. The most you should take is 10 pills a day (3,250 mg total). Note: In [place], the maximum is 12 pills a day (3,900 mg total). * HOME REMEDY - HONEY: This old home remedy has been shown to help decrease coughing at night. The adult dosage is 2 teaspoons (10 ml) at bedtime. * Telemedicine may be your best choice for care during this COVID-19 outbreak. Referrals REFERRED TO PCP OFFIC

## 2022-06-04 ENCOUNTER — Ambulatory Visit (INDEPENDENT_AMBULATORY_CARE_PROVIDER_SITE_OTHER): Payer: Medicare Other | Admitting: Family Medicine

## 2022-06-04 ENCOUNTER — Encounter: Payer: Self-pay | Admitting: Family Medicine

## 2022-06-04 VITALS — BP 118/82 | HR 91 | Temp 98.2°F | Ht 65.5 in | Wt 143.0 lb

## 2022-06-04 DIAGNOSIS — U071 COVID-19: Secondary | ICD-10-CM | POA: Diagnosis not present

## 2022-06-04 MED ORDER — NIRMATRELVIR/RITONAVIR (PAXLOVID)TABLET: 3.0000 | ORAL_TABLET | Freq: Two times a day (BID) | ORAL | 0 refills | Status: AC

## 2022-06-04 MED ORDER — BENZONATATE 200 MG PO CAPS: 200.0000 mg | ORAL_CAPSULE | Freq: Three times a day (TID) | ORAL | 1 refills | Status: DC | PRN

## 2022-06-04 NOTE — Progress Notes (Signed)
Subjective:    Patient ID: Colleen Cochran, female    DOB: Aug 05, 1945, 77 y.o.   MRN: [id number]  HPI Pt presents with c/o covid 19 illness  Wt Readings from Last 3 Encounters:  06/04/22 143 lb (64.9 kg)  03/20/22 142 lb 8 oz (64.6 kg)  12/17/21 142 lb 2 oz (64.5 kg)   23.43 kg/m  Vitals:   06/04/22 1144  BP: 118/82  Pulse: 91  Temp: 98.2 F (36.8 C)  SpO2: 95%     Symptoms started on 2/10 Tested positive Monday Sympt leveled out    Headache-is bad Dry cough- dry cough  No wheezing No sob  Nasal congestion -some yellow mucous  Sneezing  Temp up to 100.8 Some aches and chills   Appetite is fair  A little loss of smell/taste    Covid imm status - never had boosters after that set    Lab Results  Component Value Date   CREATININE 0.68 08/27/2021   BUN 19 08/27/2021   NA 140 08/27/2021   K 3.7 08/27/2021   CL 102 08/27/2021   CO2 29 08/27/2021   GFR 85.6  Otc: Ibuprofen  Inhaler- albuterol / trying for cough Flonase  Fluids - lots   Has some saline- but has not used    Patient Active Problem List   Diagnosis Date Noted   Sinus pressure 04/10/2022   Encounter for screening mammogram for breast cancer 09/02/2021   COVID-19 05/21/2021   Screening mammogram, encounter for 06/03/2017   Second hand smoke exposure 01/09/2016   Estrogen deficiency 04/04/2015   Encounter for Medicare annual wellness exam 09/30/2013   Family history of aortic aneurysm 09/30/2013   Routine general medical examination at a health care facility 11/17/2011   Hypertension 09/24/2010   POSTMENOPAUSAL STATUS 06/24/2010   Vitamin D deficiency 03/02/2009   History of colon polyps - adenomas 12/17/2007   Hyperlipidemia 10/05/2007   SINUSITIS, RECURRENT 10/05/2007   Allergic rhinitis 10/05/2007   Osteoporosis 10/05/2007   NEPHROLITHIASIS, HX OF 10/05/2007   Past Medical History:  Diagnosis Date   Allergy    allergic rhinitis   Arthritis    History of colon polyps -  adenomas 12/17/2007   Hyperlipidemia    Hypertension    Kidney stone    Nephrolithiasis    Hx of   Osteopenia    Past Surgical History:  Procedure Laterality Date   ABDOMINAL HYSTERECTOMY  04/21/1978   total for bleeding   CESAREAN SECTION  04/22/1967   COLONOSCOPY     DILATION AND CURETTAGE OF UTERUS     x2   EYE SURGERY  03/11/2021   eye surgey  02/18/2021   Social History   Tobacco Use   Smoking status: Never    Passive exposure: Yes   Smokeless tobacco: Never   Tobacco comments:    husband smokes  Vaping Use   Vaping Use: Never used  Substance Use Topics   Alcohol use: No    Alcohol/week: 0.0 standard drinks of alcohol   Drug use: No   Family History  Problem Relation Age of Onset   Hypertension Mother    Osteoporosis Mother    Heart disease Father        CABG,CAD,MI   Kidney failure Father    Osteoporosis Father        ? of OP pt had hip fx.   Hypertension Brother    Colon cancer Neg Hx    Pancreatic cancer Neg Hx  Rectal cancer Neg Hx    Stomach cancer Neg Hx    Breast cancer Neg Hx    No Known Allergies Current Outpatient Medications on File Prior to Visit  Medication Sig Dispense Refill   albuterol (VENTOLIN HFA) 108 (90 Base) MCG/ACT inhaler INHALE 1 TO 2 PUFFS BY MOUTH EVERY 4 HOURS 6.7 g 1   alendronate (FOSAMAX) 70 MG tablet Take 1 tablet (70 mg total) by mouth every 7 (seven) days. Take with a full glass of water on an empty stomach. 12 tablet 2   aspirin 81 MG tablet Take 81 mg by mouth daily.     cholecalciferol (VITAMIN D) 1000 UNITS tablet Take 1,000 Units by mouth daily.     lisinopril-hydrochlorothiazide (ZESTORETIC) 20-12.5 MG tablet Take 0.5 tablets by mouth daily. 45 tablet 11   simvastatin (ZOCOR) 10 MG tablet TAKE 1 TABLET(10 MG) BY MOUTH DAILY 30 tablet 11   No current facility-administered medications on file prior to visit.      Review of Systems  Constitutional:  Positive for activity change, fatigue and fever. Negative for  appetite change and unexpected weight change.  HENT:  Positive for postnasal drip and sneezing. Negative for congestion, ear pain, rhinorrhea, sinus pressure, sore throat and voice change.   Eyes:  Negative for pain, discharge, redness and visual disturbance.  Respiratory:  Positive for cough. Negative for shortness of breath, wheezing and stridor.   Cardiovascular:  Negative for chest pain and palpitations.  Gastrointestinal:  Negative for abdominal pain, blood in stool, constipation, diarrhea, nausea and vomiting.  Endocrine: Negative for polydipsia and polyuria.  Genitourinary:  Negative for dysuria, frequency, hematuria and urgency.  Musculoskeletal:  Negative for arthralgias, back pain and myalgias.  Skin:  Negative for pallor and rash.  Allergic/Immunologic: Negative for environmental allergies.  Neurological:  Positive for headaches. Negative for dizziness, syncope, weakness and light-headedness.  Hematological:  Negative for adenopathy. Does not bruise/bleed easily.  Psychiatric/Behavioral:  Negative for confusion, decreased concentration and dysphoric mood. The patient is not nervous/anxious.        Objective:   Physical Exam Constitutional:      General: She is not in acute distress.    Appearance: Normal appearance. She is well-developed and normal weight. She is not ill-appearing, toxic-appearing or diaphoretic.  HENT:     Head: Normocephalic and atraumatic.     Comments: Nares are injected and congested      Right Ear: Tympanic membrane, ear canal and external ear normal.     Left Ear: Tympanic membrane, ear canal and external ear normal.     Nose: Congestion and rhinorrhea present.     Mouth/Throat:     Mouth: Mucous membranes are moist.     Pharynx: Oropharynx is clear. No oropharyngeal exudate or posterior oropharyngeal erythema.     Comments: Clear pnd  Eyes:     General:        Right eye: No discharge.        Left eye: No discharge.     Conjunctiva/sclera:  Conjunctivae normal.     Pupils: Pupils are equal, round, and reactive to light.  Cardiovascular:     Rate and Rhythm: Normal rate.     Heart sounds: Normal heart sounds.  Pulmonary:     Effort: Pulmonary effort is normal. No respiratory distress.     Breath sounds: Normal breath sounds. No stridor. No wheezing, rhonchi or rales.     Comments: Good air exch No wheezing even with forced expiration  Chest:     Chest wall: No tenderness.  Musculoskeletal:     Cervical back: Normal range of motion and neck supple. No rigidity or tenderness.  Lymphadenopathy:     Cervical: No cervical adenopathy.  Skin:    General: Skin is warm and dry.     Capillary Refill: Capillary refill takes less than 2 seconds.     Coloration: Skin is not pale.     Findings: No rash.  Neurological:     Mental Status: She is alert.     Cranial Nerves: No cranial nerve deficit.  Psychiatric:        Mood and Affect: Mood normal.           Assessment & Plan:   Problem List Items Addressed This Visit       Other   COVID-19 - Primary    Day 4  Mild to mod symptoms in pt who was originally immunized (not recent) After discussion-opted to try antiviral  Px paxlovid (disc poss side effects and to update if any problems) Handouts given Disc sympt care-see AVS Tessalon sent for cough Disc ER precautions Disc isolation and masking recommendation  Signed up for mychart symptom monitoring Update if not starting to improve in a week or if worsening        Relevant Medications   nirmatrelvir/ritonavir (PAXLOVID) 20 x 150 MG & 10 x 100MG TABS   Other Relevant Orders   Temperature monitoring

## 2022-06-04 NOTE — Assessment & Plan Note (Addendum)
Day 4  Mild to mod symptoms in pt who was originally immunized (not recent) After discussion-opted to try antiviral  Px paxlovid (disc poss side effects and to update if any problems) Handouts given Disc sympt care-see AVS Tessalon sent for cough Disc ER precautions Disc isolation and masking recommendation  Signed up for mychart symptom monitoring Update if not starting to improve in a week or if worsening

## 2022-06-04 NOTE — Patient Instructions (Addendum)
Drink fluids and rest  mucinex DM is good for cough and congestion (robitussin dm is the same thing) Nasal saline for congestion as needed  You can try the tessalon pearles for cough also  Tylenol and /or ibuprofen  for fever or pain or headache   Please alert [place] if symptoms worsen (if severe or short of breath please go to the ER)    Take the paxlovid as directed  Hold your cholesterol medicine (simvastatin) for a week (so it does not interact with the Paxlovid)   Isolate until symptoms are better (5 days minimum) When you return to public wear mask for 10 more days   Update if not starting to improve in a week or if worsening

## 2022-08-07 ENCOUNTER — Telehealth: Payer: Self-pay | Admitting: Family Medicine

## 2022-08-07 MED ORDER — ALBUTEROL SULFATE HFA 108 (90 BASE) MCG/ACT IN AERS: INHALATION_SPRAY | RESPIRATORY_TRACT | 1 refills | Status: AC

## 2022-08-07 NOTE — Telephone Encounter (Signed)
Prescription Request  08/07/2022  LOV: 06/04/2022  What is the name of the medication or equipment?  albuterol (VENTOLIN HFA) 108 (90 Base) MCG/ACT inhaler  Have you contacted your pharmacy to request a refill? Yes   Which pharmacy would you like this sent to?  [id number] - [place], [place] - [address] AT [facility] [address] [facility] [address] [place] [address] Phone: [phone] Fax: [phone]   Patient notified that their request is being sent to the clinical staff for review and that they should receive a response within 2 business days.   Please advise at Mobile [phone] (mobile)

## 2022-08-27 IMAGING — MG MM DIGITAL SCREENING BILAT W/ TOMO AND CAD
8 series · 8 of 24 positions shown · non-contrast
Comparison: Previous exam(s).

CLINICAL DATA: Screening.

EXAM:
DIGITAL SCREENING BILATERAL MAMMOGRAM WITH TOMOSYNTHESIS AND CAD
TECHNIQUE: Bilateral screening digital craniocaudal and mediolateral oblique
mammograms were obtained. Bilateral screening digital breast
tomosynthesis was performed. The images were evaluated with
computer-aided detection.

[R MLO synth-2D]
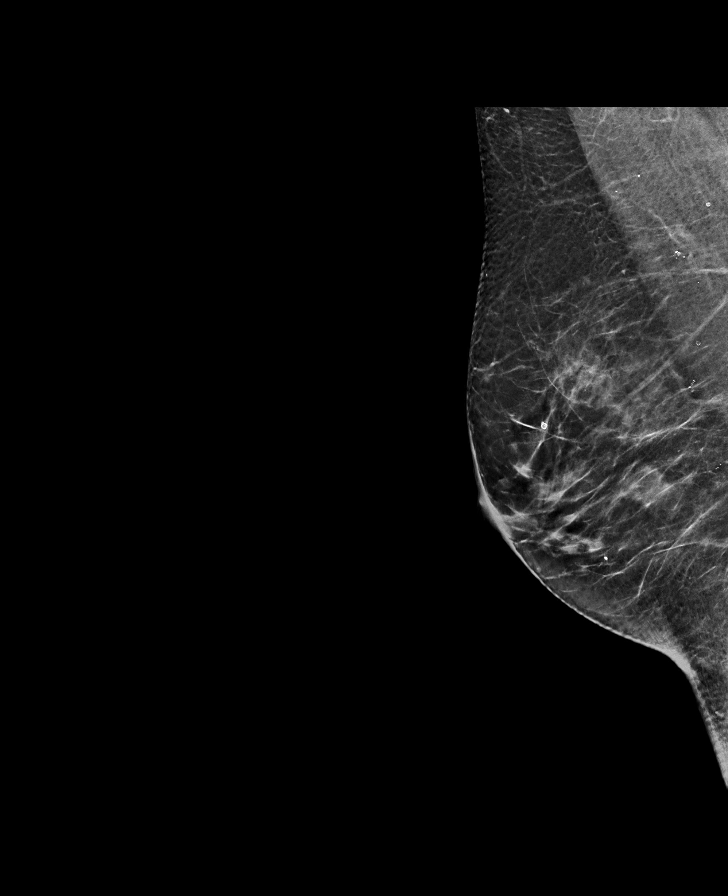

[R CC synth-2D]
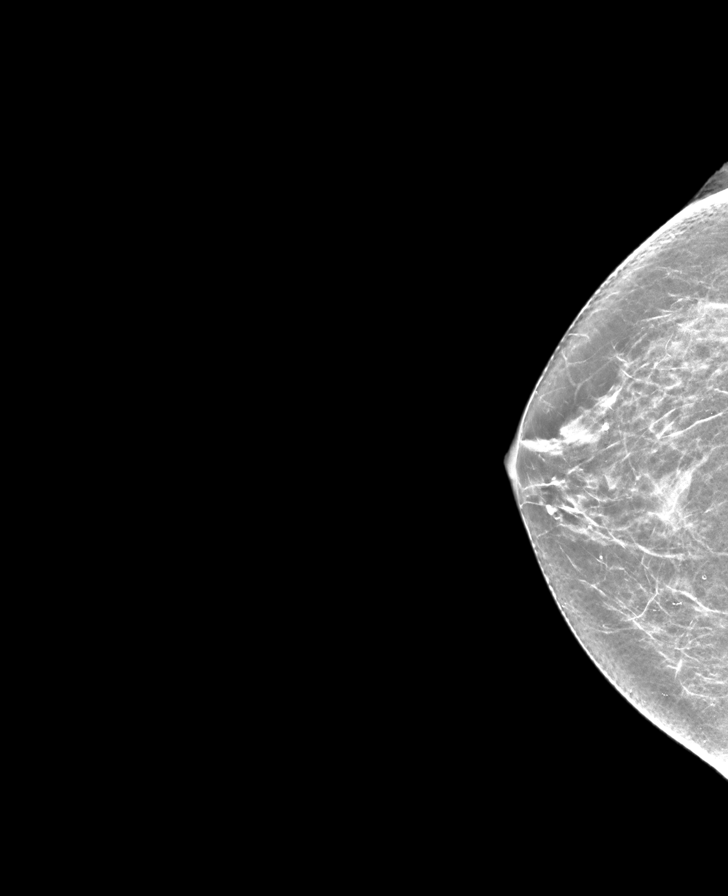

[L CC synth-2D]
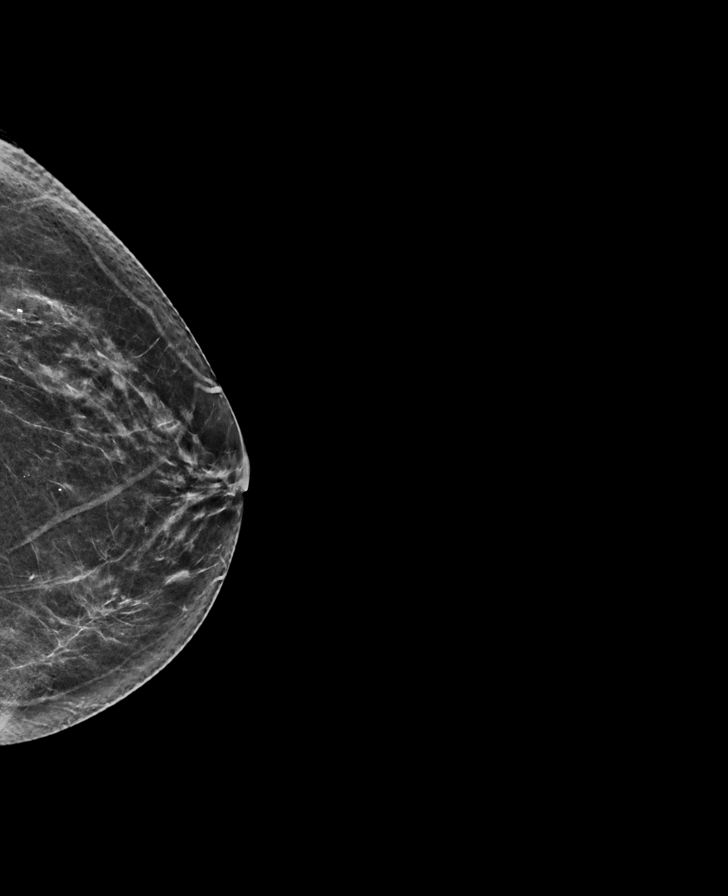

[L MLO synth-2D]
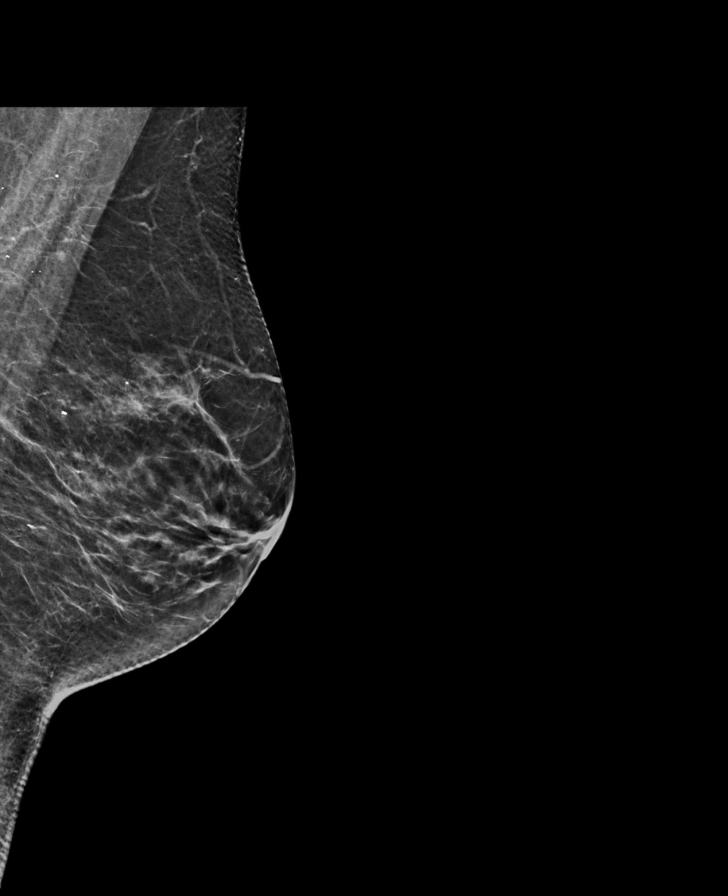

[R MLO tomo · tomo slice 33/66.0]
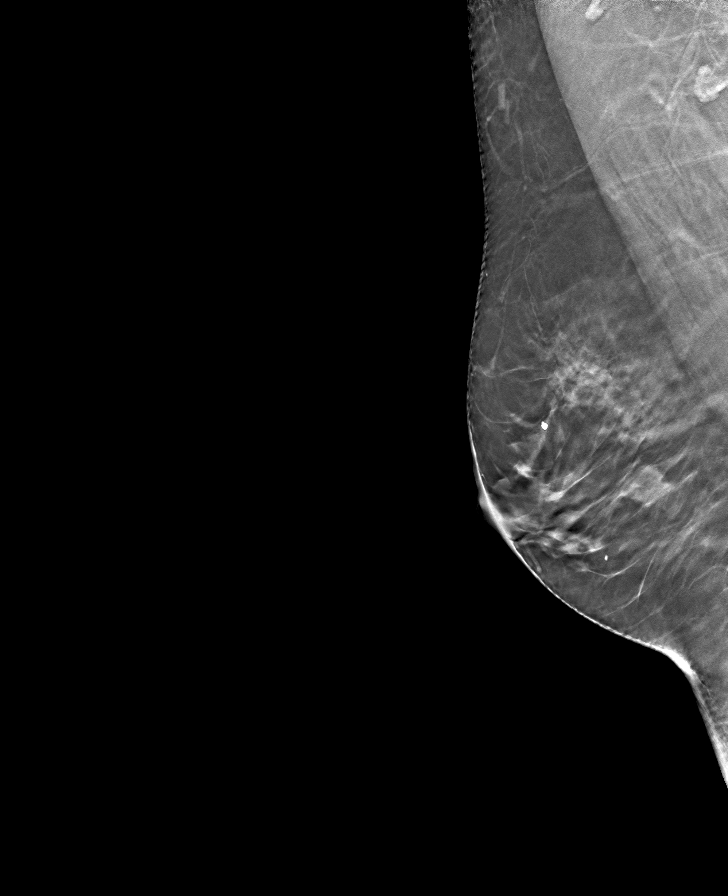

[L CC tomo · tomo slice 34/67.0]
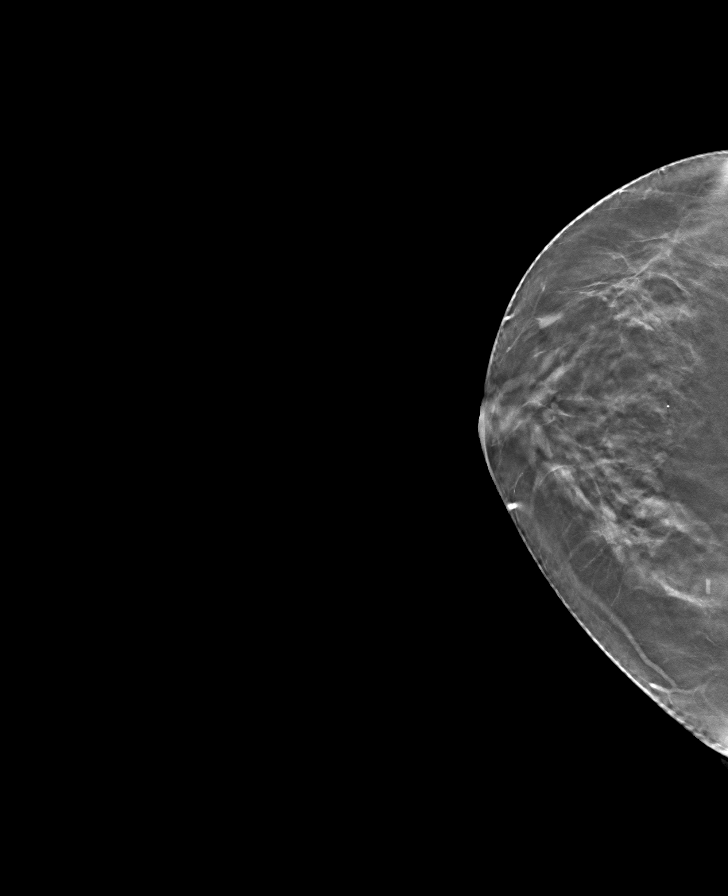

[R CC tomo · tomo slice 31/62.0]
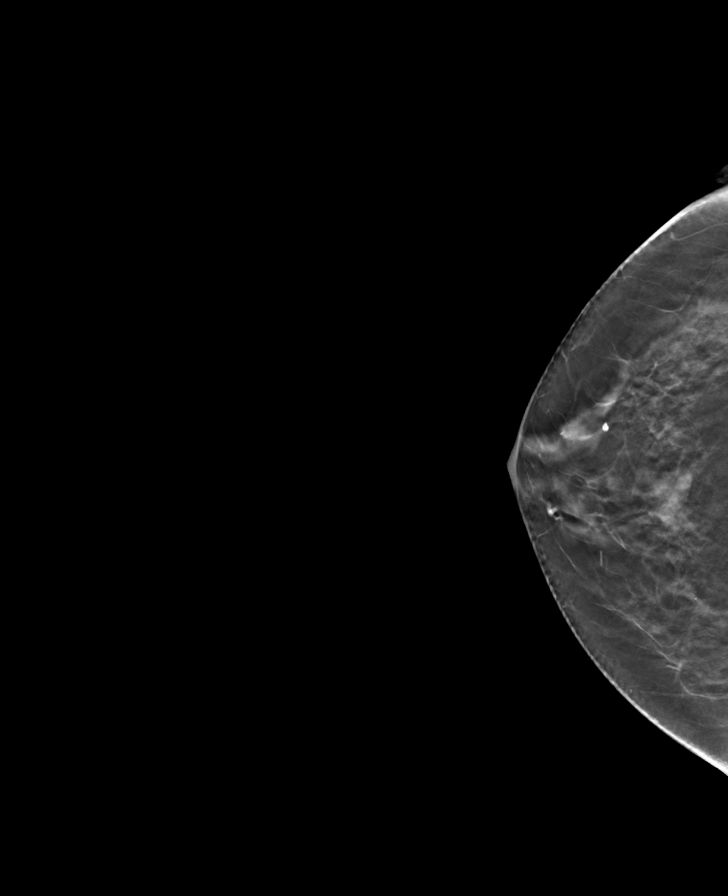

[L MLO tomo · tomo slice 32/63.0]
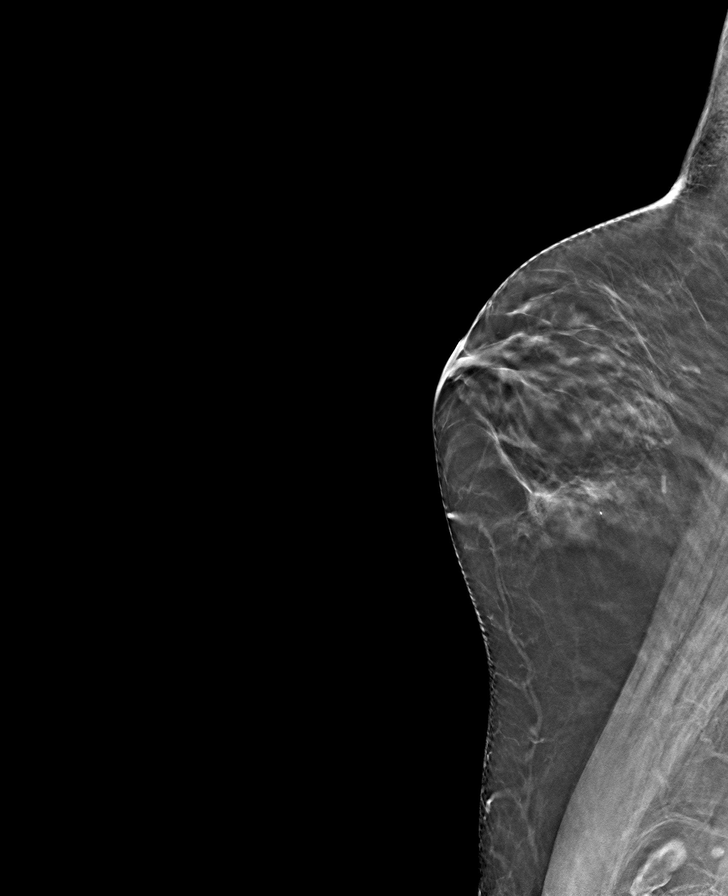

[8 of 24 positions shown; findings below may reference images not displayed]

ACR Breast Density Category c: The breast tissue is heterogeneously
dense, which may obscure small masses.
FINDINGS: There are no findings suspicious for malignancy. The images were
evaluated with computer-aided detection.
IMPRESSION: No mammographic evidence of malignancy. A result letter of this
screening mammogram will be mailed directly to the patient.

RECOMMENDATION:
Screening mammogram in one year. (Code:T4-5-GWO)

BI-RADS CATEGORY  1: Negative.

## 2022-08-28 ENCOUNTER — Ambulatory Visit (INDEPENDENT_AMBULATORY_CARE_PROVIDER_SITE_OTHER): Payer: Medicare Other

## 2022-08-28 VITALS — BP 125/75 | HR 85 | Ht 65.0 in | Wt 143.0 lb

## 2022-08-28 DIAGNOSIS — Z0000 Encounter for general adult medical examination without abnormal findings: Secondary | ICD-10-CM | POA: Diagnosis not present

## 2022-08-28 NOTE — Progress Notes (Addendum)
Subjective:   Colleen Cochran is a 77 y.o. female who presents for Medicare Annual (Subsequent) preventive examination.  I connected with  [name] on 08/28/22 by a video enabled telemedicine application and verified that I am speaking with the correct person using two identifiers.   I discussed the limitations of evaluation and management by telemedicine. The patient expressed understanding and agreed to proceed.  Review of Systems     Cardiac Risk Factors include: advanced age (>52men, >58 women)     Objective:    Today's Vitals   08/28/22 0813  BP: 125/75  Pulse: 85  Weight: 143 lb (64.9 kg)  Height: 5\' 5"  (1.651 m)  PainSc: 0-No pain   Body mass index is 23.8 kg/m.     08/28/2022    8:25 AM 08/26/2021    8:57 AM 06/13/2019   10:32 AM 06/08/2018    4:26 PM 05/28/2017    9:52 AM 05/27/2016    1:25 PM 12/12/2013   12:38 PM  Advanced Directives  Does Patient Have a Medical Advance Directive? No No No No No No No  Would patient like information on creating a medical advance directive? No - Patient declined No - Patient declined No - Patient declined No - Patient declined Yes (MAU/Ambulatory/Procedural Areas - Information given)      Current Medications (verified) Outpatient Encounter Medications as of 08/28/2022  Medication Sig   aspirin 81 MG tablet Take 81 mg by mouth daily.   cholecalciferol (VITAMIN D) 1000 UNITS tablet Take 1,000 Units by mouth daily.   lisinopril-hydrochlorothiazide (ZESTORETIC) 20-12.5 MG tablet Take 0.5 tablets by mouth daily.   simvastatin (ZOCOR) 10 MG tablet TAKE 1 TABLET(10 MG) BY MOUTH DAILY   albuterol (VENTOLIN HFA) 108 (90 Base) MCG/ACT inhaler INHALE 1 TO 2 PUFFS BY MOUTH EVERY 4 HOURS (Patient not taking: Reported on 08/28/2022)   alendronate (FOSAMAX) 70 MG tablet Take 1 tablet (70 mg total) by mouth every 7 (seven) days. Take with a full glass of water on an empty stomach. (Patient not taking: Reported on 08/28/2022)   benzonatate  (TESSALON) 200 MG capsule Take 1 capsule (200 mg total) by mouth 3 (three) times daily as needed for cough. Swallow whole (Patient not taking: Reported on 08/28/2022)   No facility-administered encounter medications on file as of 08/28/2022.    Allergies (verified) Patient has no known allergies.   History: Past Medical History:  Diagnosis Date   Allergy    allergic rhinitis   Arthritis    History of colon polyps - adenomas 12/17/2007   Hyperlipidemia    Hypertension    Kidney stone    Nephrolithiasis    Hx of   Osteopenia    Past Surgical History:  Procedure Laterality Date   ABDOMINAL HYSTERECTOMY  04/21/1978   total for bleeding   CESAREAN SECTION  04/22/1967   COLONOSCOPY     DILATION AND CURETTAGE OF UTERUS     x2   EYE SURGERY  03/11/2021   eye surgey  02/18/2021   Family History  Problem Relation Age of Onset   Hypertension Mother    Osteoporosis Mother    Heart disease Father        CABG,CAD,MI   Kidney failure Father    Osteoporosis Father        ? of OP pt had hip fx.   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  Tobacco Use   Smoking status: Never    Passive exposure: Yes (husband is a smoker but she doesn't smoke and never has.)   Smokeless tobacco: Never   Tobacco comments:    husband smokes  Vaping Use   Vaping Use: Never used  Substance and Sexual Activity   Alcohol use: No    Alcohol/week: 0.0 standard drinks of alcohol   Drug use: No   Sexual activity: Never  Other Topics Concern   Not on file   Social History Narrative   7 grandchildren.   2 great grandchildren.   Married x 54 years in 2023.   Social Determinants of Health   Financial Resource Strain: Low Risk  (08/28/2022)   Overall Financial Resource Strain (CARDIA)    Difficulty of Paying Living Expenses: Not hard at all  Food Insecurity: No Food Insecurity (08/28/2022)   Hunger Vital Sign    Worried About Running Out of Food in the Last Year: Never true    Ran Out of Food in the Last Year: Never true  Transportation Needs: No Transportation Needs (08/28/2022)   PRAPARE - Administrator, Civil Service (Medical): No    Lack of Transportation (Non-Medical): No  Physical Activity: Sufficiently Active (08/28/2022)   Exercise Vital Sign    Days of Exercise per Week: 7 days    Minutes of Exercise per Session: 30 min  Stress: No Stress Concern Present (08/28/2022)   [employer] of Occupational Health - Occupational Stress Questionnaire    Feeling of Stress : Not at all  Social Connections: Socially Integrated (08/28/2022)   Social Connection and Isolation Panel [NHANES]    Frequency of Communication with Friends and Family: More than three times a week    Frequency of Social Gatherings with Friends and Family: More than three times a week    Attends Religious Services: More than 4 times per year    Active Member of [employer] or Organizations: Yes    Attends Engineer, structural: More than 4 times per year    Marital Status: Married    Tobacco Counseling Counseling given: Yes Tobacco comments: husband smokes   Clinical Intake:  Pre-visit preparation completed: Yes  Pain : No/denies pain Pain Score: 0-No pain     BMI - recorded: 23.8 Nutritional Status: BMI of 19-24  Normal Nutritional Risks: None Diabetes: No  How often do you need to have someone help you when you read instructions, pamphlets, or other written materials from your doctor or pharmacy?: 1 - Never  Diabetic?NoInterpreter Needed?:  No  Information entered by :: Abby W. CMA (AAMA)   Activities of Daily Living    08/28/2022    8:29 AM  In your present state of health, do you have any difficulty performing the following activities:  Hearing? 0  Vision? 1  Difficulty concentrating or making decisions? 0  Walking or climbing stairs? 0  Dressing or bathing? 0  Doing errands, shopping? 0  Preparing Food and eating ?  N  Using the Toilet? N  In the past six months, have you accidently leaked urine? N  Do you have problems with loss of bowel control? N  Managing your Medications? N  Managing your Finances? N  Housekeeping or managing your Housekeeping? N    Patient Care Team: Tower, Audrie Gallus, MD as PCP - General Kassie Mends, DDS as Consulting Physician (Dentistry) Debbrah Alar, MD as Consulting Physician (Dermatology) Irene Limbo., MD as Consulting Physician (Ophthalmology)  Indicate any recent Medical Services you may have received from other than [facility] providers in the past year (date may be approximate).     Assessment:   This is a routine wellness examination for [place].  Hearing/Vision screen Hearing Screening - Comments:: Patient denies any hearing difficulties  Vision Screening - Comments:: Patient wears glasses. Had cataract surgery  2 years ago and wears glasses mainly for reading.   Dietary issues and exercise activities discussed: Current Exercise Habits: Home exercise routine, Type of exercise: walking, Time (Minutes): 30, Frequency (Times/Week): 4, Weekly Exercise (Minutes/Week): 120, Intensity: Mild, Exercise limited by: None identified   Goals Addressed               This Visit's Progress     Activity and Exercise Increased (pt-stated)        Patient stated she would like to increase her daily activity/exercise.      Depression Screen    08/28/2022    8:29 AM 08/28/2022    8:19 AM 08/26/2021    8:51 AM 08/23/2020    8:58 AM 06/13/2019   10:34 AM 06/08/2018    8:11 AM 05/28/2017     9:42 AM  PHQ 2/9 Scores  PHQ - 2 Score 0 0 0 0 0 0 0  PHQ- 9 Score     0 0 0    Fall Risk    08/28/2022    8:26 AM 08/26/2021    8:58 AM 08/23/2020    8:57 AM 06/13/2019   10:33 AM 06/08/2018    8:11 AM  Fall Risk   Falls in the past year? 0 0  0 0  Number falls in past yr: 0 0 0 0   Injury with Fall? 0 0  0   Risk for fall due to : No Fall Risks No Fall Risks  Medication side effect   Follow up Falls prevention discussed Falls prevention discussed Falls evaluation completed Falls evaluation completed;Falls prevention discussed     FALL RISK PREVENTION PERTAINING TO THE HOME:  Any stairs in or around the home? No  If so, are there any without handrails? No  Home free of loose throw rugs in walkways, pet beds, electrical cords, etc? No  Adequate lighting in your home to reduce risk of falls? Yes   ASSISTIVE DEVICES UTILIZED TO PREVENT FALLS:  Life alert? No  Use of a cane, walker or w/c? No  Grab bars in the bathroom? No  Shower chair or bench in shower? No  Elevated toilet seat or a handicapped toilet? No   TIMED UP AND GO:  Was the test performed? No  telephone visit     Cognitive Function:    06/13/2019   10:36 AM 06/08/2018    8:10 AM 05/28/2017    9:42 AM 05/27/2016    1:05 PM  MMSE - Mini Mental State Exam  Orientation to time 5 5 5 5   Orientation to Place 5 5 5 5   Registration 3 3 3  3  Attention/ Calculation 5 0 0 0  Recall 3 3 3 3   Language- name 2 objects  0 0 0  Language- repeat 1 1 1 1   Language- follow 3 step command  3 3 3   Language- read & follow direction  0 0 0  Write a sentence  0 0 0  Copy design  0 0 0  Total score  20 20 20         08/28/2022    8:30 AM 08/26/2021    9:00 AM  6CIT Screen  What Year? 0 points 0 points  What month? 0 points 0 points  What time? 0 points 0 points  Count back from 20 0 points 0 points  Months in reverse 0 points 0 points  Repeat phrase 0 points 0 points  Total Score 0 points 0 points     Immunizations Immunization History  Administered Date(s) Administered   Fluad Quad(high Dose 65+) 01/21/2019   Influenza,inj,Quad PF,6+ Mos 01/09/2014, 04/04/2015, 01/09/2016, 02/10/2017, 03/11/2018   PFIZER(Purple Top)SARS-COV-2 Vaccination 06/12/2019, 07/06/2019   Pneumococcal Conjugate-13 04/04/2015   Pneumococcal Polysaccharide-23 09/30/2013   Td 10/06/2007   Varicella Zoster Immune Globulin 09/24/2010   Zoster, Live 11/24/2010    TDAP status: Due, Education has been provided regarding the importance of this vaccine. Advised may receive this vaccine at local pharmacy or Health Dept. Aware to provide a copy of the vaccination record if obtained from local pharmacy or Health Dept. Verbalized acceptance and understanding.  Flu Vaccine status: Up to date  Pneumococcal vaccine status: Up to date  Covid-19 vaccine status: Information provided on how to obtain vaccines.   Qualifies for Shingles Vaccine? Yes   Zostavax completed Yes   Shingrix Completed?: No.    Education has been provided regarding the importance of this vaccine. Patient has been advised to call insurance company to determine out of pocket expense if they have not yet received this vaccine. Advised may also receive vaccine at local pharmacy or Health Dept. Verbalized acceptance and understanding.  Screening Tests Health Maintenance  Topic Date Due   DTaP/Tdap/Td (2 - Tdap) 10/05/2017   COVID-19 Vaccine (3 - Pfizer risk series) 08/03/2019   Zoster Vaccines- Shingrix (1 of 2) 08/28/2022 (Originally 12/24/1964)   INFLUENZA VACCINE  11/20/2022   Medicare Annual Wellness (AWV)  08/28/2023   Fecal DNA (Cologuard)  10/06/2023   Pneumonia Vaccine 39+ Years old  Completed   DEXA SCAN  Completed   Hepatitis C Screening  Completed   HPV VACCINES  Aged Out    Health Maintenance  Health Maintenance Due  Topic Date Due   DTaP/Tdap/Td (2 - Tdap) 10/05/2017   COVID-19 Vaccine (3 - Pfizer risk series) 08/03/2019     Colorectal cancer screening: Type of screening: Colonoscopy. Completed 12/12/2013. Repeat every 10 years  Mammogram status: Completed 12/02/2021. Repeat every year  Bone Density status: Completed 12/02/2021. Results reflect: Bone density results: OSTEOPOROSIS. Repeat every 2 years.  Lung Cancer Screening: (Low Dose CT Chest recommended if Age 57-80 years, 30 pack-year currently smoking OR have quit w/in 15years.) does not qualify.   Lung Cancer Screening Referral: N/A  Additional Screening:  Hepatitis C Screening: does qualify; Completed 04/2016  Vision Screening: Recommended annual ophthalmology exams for early detection of glaucoma and other disorders of the eye. Is the patient up to date with their annual eye exam?  No  Who is the provider or what is the name of the office in which the patient attends annual eye exams? Dr.Bell/[place] If  pt is not established with a provider, would they like to be referred to a provider to establish care? No .   Dental Screening: Recommended annual dental exams for proper oral hygiene  Community Resource Referral / Chronic Care Management: CRR required this visit?  No   CCM required this visit?  No      Plan:     I have personally reviewed and noted the following in the patient's chart:   Medical and social history Use of alcohol, tobacco or illicit drugs  Current medications and supplements including opioid prescriptions. Patient is not currently taking opioid prescriptions. Functional ability and status Nutritional status Physical activity Advanced directives List of other physicians Hospitalizations, surgeries, and ER visits in previous 12 months Vitals Screenings to include cognitive, depression, and falls Referrals and appointments  In addition, I have reviewed and discussed with patient certain preventive protocols, quality metrics, and best practice recommendations. A written personalized care plan for preventive services  as well as general preventive health recommendations were provided to patient.  Ms. Genson , Thank you for taking time to come for your Medicare Wellness Visit. I appreciate your ongoing commitment to your health goals. Please review the following plan we discussed and let me know if I can assist you in the future.   These are the goals we discussed:  Goals       Activity and Exercise Increased (pt-stated)      Patient stated she would like to increase her daily activity/exercise.      Follow up with Primary Care Provider      Starting 06/08/2018, I will continue to take medications as prescribed and to keep appointments with PCP as scheduled.       Patient Stated      06/13/2019, I will maintain and continue medications as prescribed.         This is a list of the screening recommended for you and due dates:  Health Maintenance  Topic Date Due   DTaP/Tdap/Td vaccine (2 - Tdap) 10/05/2017   COVID-19 Vaccine (3 - Pfizer risk series) 08/03/2019   Zoster (Shingles) Vaccine (1 of 2) 08/28/2022*   Flu Shot  11/20/2022   Medicare Annual Wellness Visit  08/28/2023   Cologuard (Stool DNA test)  10/06/2023   Pneumonia Vaccine  Completed   DEXA scan (bone density measurement)  Completed   Hepatitis C Screening: USPSTF Recommendation to screen - Ages 60-79 yo.  Completed   HPV Vaccine  Aged Out  *Topic was postponed. The date shown is not the original due date.      Jordan Hawks Haley Fuerstenberg, CMA   08/28/2022   Nurse Notes: patient will call the office to schedule her annual physical. She will call her insurance company regarding TDAP and Shingrix vaccines.

## 2022-08-28 NOTE — Patient Instructions (Signed)
Colleen Cochran , Thank you for taking time to come for your Medicare Wellness Visit. I appreciate your ongoing commitment to your health goals. Please review the following plan we discussed and let me know if I can assist you in the future.   These are the goals we discussed:  Goals       Activity and Exercise Increased (pt-stated)      Patient stated she would like to increase her daily activity/exercise.      Follow up with Primary Care Provider      Starting 06/08/2018, I will continue to take medications as prescribed and to keep appointments with PCP as scheduled.       Patient Stated      06/13/2019, I will maintain and continue medications as prescribed.         This is a list of the screening recommended for you and due dates:  Health Maintenance  Topic Date Due   DTaP/Tdap/Td vaccine (2 - Tdap) 10/05/2017   COVID-19 Vaccine (3 - Pfizer risk series) 08/03/2019   Zoster (Shingles) Vaccine (1 of 2) 08/28/2022*   Flu Shot  11/20/2022   Medicare Annual Wellness Visit  08/28/2023   Cologuard (Stool DNA test)  10/06/2023   Pneumonia Vaccine  Completed   DEXA scan (bone density measurement)  Completed   Hepatitis C Screening: USPSTF Recommendation to screen - Ages 11-79 yo.  Completed   HPV Vaccine  Aged Out  *Topic was postponed. The date shown is not the original due date.    Advanced directives: Advance directive discussed with you today. Even though you declined this today, please call our office should you change your mind, and we can give you the proper paperwork for you to fill out.   Conditions/risks identified: none identified  Next appointment: Follow up in one year for your annual wellness visit 08/31/2023 @ 8:30   Preventive Care 65 Years and Older, Female Preventive care refers to lifestyle choices and visits with your health care provider that can promote health and wellness. What does preventive care include? A yearly physical exam. This is also called an annual  well check. Dental exams once or twice a year. Routine eye exams. Ask your health care provider how often you should have your eyes checked. Personal lifestyle choices, including: Daily care of your teeth and gums. Regular physical activity. Eating a healthy diet. Avoiding tobacco and drug use. Limiting alcohol use. Practicing safe sex. Taking low-dose aspirin every day. Taking vitamin and mineral supplements as recommended by your health care provider. What happens during an annual well check? The services and screenings done by your health care provider during your annual well check will depend on your age, overall health, lifestyle risk factors, and family history of disease. Counseling  Your health care provider may ask you questions about your: Alcohol use. Tobacco use. Drug use. Emotional well-being. Home and relationship well-being. Sexual activity. Eating habits. History of falls. Memory and ability to understand (cognition). Work and work Astronomer. Reproductive health. Screening  You may have the following tests or measurements: Height, weight, and BMI. Blood pressure. Lipid and cholesterol levels. These may be checked every 5 years, or more frequently if you are over 61 years old. Skin check. Lung cancer screening. You may have this screening every year starting at age 34 if you have a 30-pack-year history of smoking and currently smoke or have quit within the past 15 years. Fecal occult blood test (FOBT) of the stool. You may  have this test every year starting at age 65. Flexible sigmoidoscopy or colonoscopy. You may have a sigmoidoscopy every 5 years or a colonoscopy every 10 years starting at age 39. Hepatitis C blood test. Hepatitis B blood test. Sexually transmitted disease (STD) testing. Diabetes screening. This is done by checking your blood sugar (glucose) after you have not eaten for a while (fasting). You may have this done every 1-3 years. Bone density  scan. This is done to screen for osteoporosis. You may have this done starting at age 84. Mammogram. This may be done every 1-2 years. Talk to your health care provider about how often you should have regular mammograms. Talk with your health care provider about your test results, treatment options, and if necessary, the need for more tests. Vaccines  Your health care provider may recommend certain vaccines, such as: Influenza vaccine. This is recommended every year. Tetanus, diphtheria, and acellular pertussis (Tdap, Td) vaccine. You may need a Td booster every 10 years. Zoster vaccine. You may need this after age 69. Pneumococcal 13-valent conjugate (PCV13) vaccine. One dose is recommended after age 89. Pneumococcal polysaccharide (PPSV23) vaccine. One dose is recommended after age 15. Talk to your health care provider about which screenings and vaccines you need and how often you need them. This information is not intended to replace advice given to you by your health care provider. Make sure you discuss any questions you have with your health care provider. Document Released: 05/04/2015 Document Revised: 12/26/2015 Document Reviewed: 02/06/2015 Elsevier Interactive Patient Education  2017 [employer].  Fall Prevention in the Home Falls can cause injuries. They can happen to people of all ages. There are many things you can do to make your home safe and to help prevent falls. What can I do on the outside of my home? Regularly fix the edges of walkways and driveways and fix any cracks. Remove anything that might make you trip as you walk through a door, such as a raised step or threshold. Trim any bushes or trees on the path to your home. Use bright outdoor lighting. Clear any walking paths of anything that might make someone trip, such as rocks or tools. Regularly check to see if handrails are loose or broken. Make sure that both sides of any steps have handrails. Any raised decks and  porches should have guardrails on the edges. Have any leaves, snow, or ice cleared regularly. Use sand or salt on walking paths during winter. Clean up any spills in your garage right away. This includes oil or grease spills. What can I do in the bathroom? Use night lights. Install grab bars by the toilet and in the tub and shower. Do not use towel bars as grab bars. Use non-skid mats or decals in the tub or shower. If you need to sit down in the shower, use a plastic, non-slip stool. Keep the floor dry. Clean up any water that spills on the floor as soon as it happens. Remove soap buildup in the tub or shower regularly. Attach bath mats securely with double-sided non-slip rug tape. Do not have throw rugs and other things on the floor that can make you trip. What can I do in the bedroom? Use night lights. Make sure that you have a light by your bed that is easy to reach. Do not use any sheets or blankets that are too big for your bed. They should not hang down onto the floor. Have a firm chair that has side arms.  You can use this for support while you get dressed. Do not have throw rugs and other things on the floor that can make you trip. What can I do in the kitchen? Clean up any spills right away. Avoid walking on wet floors. Keep items that you use a lot in easy-to-reach places. If you need to reach something above you, use a strong step stool that has a grab bar. Keep electrical cords out of the way. Do not use floor polish or wax that makes floors slippery. If you must use wax, use non-skid floor wax. Do not have throw rugs and other things on the floor that can make you trip. What can I do with my stairs? Do not leave any items on the stairs. Make sure that there are handrails on both sides of the stairs and use them. Fix handrails that are broken or loose. Make sure that handrails are as long as the stairways. Check any carpeting to make sure that it is firmly attached to the  stairs. Fix any carpet that is loose or worn. Avoid having throw rugs at the top or bottom of the stairs. If you do have throw rugs, attach them to the floor with carpet tape. Make sure that you have a light switch at the top of the stairs and the bottom of the stairs. If you do not have them, ask someone to add them for you. What else can I do to help prevent falls? Wear shoes that: Do not have high heels. Have rubber bottoms. Are comfortable and fit you well. Are closed at the toe. Do not wear sandals. If you use a stepladder: Make sure that it is fully opened. Do not climb a closed stepladder. Make sure that both sides of the stepladder are locked into place. Ask someone to hold it for you, if possible. Clearly mark and make sure that you can see: Any grab bars or handrails. First and last steps. Where the edge of each step is. Use tools that help you move around (mobility aids) if they are needed. These include: Canes. Walkers. Scooters. Crutches. Turn on the lights when you go into a dark area. Replace any light bulbs as soon as they burn out. Set up your furniture so you have a clear path. Avoid moving your furniture around. If any of your floors are uneven, fix them. If there are any pets around you, be aware of where they are. Review your medicines with your doctor. Some medicines can make you feel dizzy. This can increase your chance of falling. Ask your doctor what other things that you can do to help prevent falls. This information is not intended to replace advice given to you by your health care provider. Make sure you discuss any questions you have with your health care provider. Document Released: 02/01/2009 Document Revised: 09/13/2015 Document Reviewed: 05/12/2014 Elsevier Interactive Patient Education  2017 [employer].

## 2022-09-04 ENCOUNTER — Other Ambulatory Visit: Payer: Self-pay | Admitting: Family Medicine

## 2022-09-07 ENCOUNTER — Telehealth: Payer: Self-pay | Admitting: Family Medicine

## 2022-09-07 DIAGNOSIS — E782 Mixed hyperlipidemia: Secondary | ICD-10-CM

## 2022-09-07 DIAGNOSIS — M810 Age-related osteoporosis without current pathological fracture: Secondary | ICD-10-CM

## 2022-09-07 DIAGNOSIS — E559 Vitamin D deficiency, unspecified: Secondary | ICD-10-CM

## 2022-09-07 DIAGNOSIS — I10 Essential (primary) hypertension: Secondary | ICD-10-CM

## 2022-09-07 NOTE — Telephone Encounter (Signed)
-----   Message from Alvina Chou sent at 09/01/2022 10:47 AM EDT ----- Regarding: Lab orders for Monday, 5.20.24 Patient is scheduled for CPX labs, please order future labs, Thanks , Camelia Eng

## 2022-09-08 ENCOUNTER — Other Ambulatory Visit (INDEPENDENT_AMBULATORY_CARE_PROVIDER_SITE_OTHER): Payer: Medicare Other

## 2022-09-08 DIAGNOSIS — E559 Vitamin D deficiency, unspecified: Secondary | ICD-10-CM | POA: Diagnosis not present

## 2022-09-08 DIAGNOSIS — I10 Essential (primary) hypertension: Secondary | ICD-10-CM

## 2022-09-08 DIAGNOSIS — E782 Mixed hyperlipidemia: Secondary | ICD-10-CM

## 2022-09-08 LAB — CBC WITH DIFFERENTIAL/PLATELET
Basophils Absolute: 0 10*3/uL (ref 0.0–0.1)
Basophils Relative: 0.9 % (ref 0.0–3.0)
Eosinophils Absolute: 0.1 10*3/uL (ref 0.0–0.7)
Eosinophils Relative: 2.4 % (ref 0.0–5.0)
HCT: 39.4 % (ref 36.0–46.0)
Hemoglobin: 13.3 g/dL (ref 12.0–15.0)
Lymphocytes Relative: 29.2 % (ref 12.0–46.0)
Lymphs Abs: 1.3 10*3/uL (ref 0.7–4.0)
MCHC: 33.8 g/dL (ref 30.0–36.0)
MCV: 89.6 fl (ref 78.0–100.0)
Monocytes Absolute: 0.4 10*3/uL (ref 0.1–1.0)
Monocytes Relative: 9.6 % (ref 3.0–12.0)
Neutro Abs: 2.5 10*3/uL (ref 1.4–7.7)
Neutrophils Relative %: 57.9 % (ref 43.0–77.0)
Platelets: 372 10*3/uL (ref 150.0–400.0)
RBC: 4.4 Mil/uL (ref 3.87–5.11)
RDW: 13.6 % (ref 11.5–15.5)
WBC: 4.4 10*3/uL (ref 4.0–10.5)

## 2022-09-08 LAB — LIPID PANEL
Cholesterol: 148 mg/dL (ref 0–200)
HDL: 47.1 mg/dL (ref >39.00)
LDL Cholesterol: 84 mg/dL (ref 0–99)
NonHDL: 101.36
Total CHOL/HDL Ratio: 3
Triglycerides: 85 mg/dL (ref 0.0–149.0)
VLDL: 17 mg/dL (ref 0.0–40.0)

## 2022-09-08 LAB — COMPREHENSIVE METABOLIC PANEL
ALT: 14 U/L (ref 0–35)
AST: 17 U/L (ref 0–37)
Albumin: 4.2 g/dL (ref 3.5–5.2)
Alkaline Phosphatase: 51 U/L (ref 39–117)
BUN: 13 mg/dL (ref 6–23)
CO2: 29 mEq/L (ref 19–32)
Calcium: 9.4 mg/dL (ref 8.4–10.5)
Chloride: 103 mEq/L (ref 96–112)
Creatinine, Ser: 0.65 mg/dL (ref 0.40–1.20)
GFR: 85.95 mL/min (ref >60.00)
Glucose, Bld: 88 mg/dL (ref 70–99)
Potassium: 3.8 mEq/L (ref 3.5–5.1)
Sodium: 142 mEq/L (ref 135–145)
Total Bilirubin: 0.6 mg/dL (ref 0.2–1.2)
Total Protein: 7.1 g/dL (ref 6.0–8.3)

## 2022-09-09 LAB — VITAMIN D 25 HYDROXY (VIT D DEFICIENCY, FRACTURES): VITD: 35.63 ng/mL (ref 30.00–100.00)

## 2022-09-09 LAB — TSH: TSH: 1.19 u[IU]/mL (ref 0.35–5.50)

## 2022-09-11 ENCOUNTER — Encounter: Payer: Self-pay | Admitting: Family Medicine

## 2022-09-11 ENCOUNTER — Ambulatory Visit (INDEPENDENT_AMBULATORY_CARE_PROVIDER_SITE_OTHER): Payer: Medicare Other | Admitting: Family Medicine

## 2022-09-11 VITALS — BP 149/78 | HR 83 | Temp 97.6°F | Ht 64.5 in | Wt 140.2 lb

## 2022-09-11 DIAGNOSIS — Z0000 Encounter for general adult medical examination without abnormal findings: Secondary | ICD-10-CM

## 2022-09-11 DIAGNOSIS — Z860100 Personal history of colon polyps, unspecified: Secondary | ICD-10-CM

## 2022-09-11 DIAGNOSIS — E559 Vitamin D deficiency, unspecified: Secondary | ICD-10-CM

## 2022-09-11 DIAGNOSIS — I10 Essential (primary) hypertension: Secondary | ICD-10-CM

## 2022-09-11 DIAGNOSIS — E782 Mixed hyperlipidemia: Secondary | ICD-10-CM | POA: Diagnosis not present

## 2022-09-11 DIAGNOSIS — M810 Age-related osteoporosis without current pathological fracture: Secondary | ICD-10-CM

## 2022-09-11 MED ORDER — SIMVASTATIN 10 MG PO TABS: ORAL_TABLET | ORAL | 3 refills | Status: DC

## 2022-09-11 MED ORDER — LISINOPRIL-HYDROCHLOROTHIAZIDE 20-12.5 MG PO TABS
1.0000 | ORAL_TABLET | Freq: Every day | ORAL | 1 refills | Status: DC
Start: 2022-09-11 — End: 2022-10-14

## 2022-09-11 NOTE — Assessment & Plan Note (Signed)
Disc goals for lipids and reasons to control them Rev last labs with pt Rev low sat fat diet in detail Stable Continue simvastatin 10 mg daily

## 2022-09-11 NOTE — Assessment & Plan Note (Signed)
BP: (!) 149/78   Not at goal Will increase lisinopril hct to a whole pill of 10-12.5   F/u in about a month or earlier if needed  Discussed lifstsyle habits

## 2022-09-11 NOTE — Progress Notes (Signed)
Subjective:    Patient ID: Colleen Cochran, female    DOB: [birth year], 77 y.o.   MRN: [id number]  HPI Here for health maintenance exam and to review chronic medical problems    Wt Readings from Last 3 Encounters:  09/11/22 140 lb 4 oz (63.6 kg)  08/28/22 143 lb (64.9 kg)  06/04/22 143 lb (64.9 kg)   23.70 kg/m  Vitals:   09/11/22 0930  BP: (!) 154/82  Pulse: 83  Temp: 97.6 F (36.4 C)  SpO2: 98%    Immunization History  Administered Date(s) Administered   Fluad Quad(high Dose 65+) 01/21/2019   Influenza,inj,Quad PF,6+ Mos 01/09/2014, 04/04/2015, 01/09/2016, 02/10/2017, 03/11/2018   PFIZER(Purple Top)SARS-COV-2 Vaccination 06/12/2019, 07/06/2019   Pneumococcal Conjugate-13 04/04/2015   Pneumococcal Polysaccharide-23 09/30/2013   Td 10-Oct-2007   Varicella Zoster Immune Globulin 09/24/2010   Zoster, Live 11/24/2010   Health Maintenance Due  Topic Date Due   DTaP/Tdap/Td (2 - Tdap) 10-09-17   BIL died last 2022/09/18  Has been hard on her   Trying to take care of herself   Mammogram 11/2021  Self breast exam- no lumps   Shingrix -  interested now   Tetanus shot -interested at pharmacy   Cologuard 09/2020 negative   Colonoscopy 09-17-13- no polyps (originally recommended recall 2023/09/18)   Dexa 11/2021 osteoporosis  Had side effects from alendronate - general malaise  May try again  Falls-none Fractures-none  Supplements  Last vitamin D Lab Results  Component Value Date   VD25OH 35.63 09/08/2022    Exercise - some walking    Mood    08/28/2022    8:29 AM 08/28/2022    8:19 AM 08/26/2021    8:51 AM 08/23/2020    8:58 AM 06/13/2019   10:34 AM  Depression screen PHQ 2/9  Decreased Interest 0 0 0 0 0  Down, Depressed, Hopeless 0 0 0 0 0  PHQ - 2 Score 0 0 0 0 0  Altered sleeping     0  Tired, decreased energy     0  Change in appetite     0  Feeling bad or failure about yourself      0  Trouble concentrating     0  Moving slowly or fidgety/restless     0   Suicidal thoughts     0  PHQ-9 Score     0  Difficult doing work/chores     Not difficult at all     HTN bp is stable today  No cp or palpitations or headaches or edema  No side effects to medicines  BP Readings from Last 3 Encounters:  09/11/22 (!) 154/82  08/28/22 125/75  06/04/22 118/82    Lisinopril hct 20-12.5 mg daily   Up and down at home  Most of the time it is ok at home  Her cuff may run lower     Last metabolic panel Lab Results  Component Value Date   GLUCOSE 88 09/08/2022   NA 142 09/08/2022   K 3.8 09/08/2022   CL 103 09/08/2022   CO2 29 09/08/2022   BUN 13 09/08/2022   CREATININE 0.65 09/08/2022   GFRNONAA >60 01/05/2016   CALCIUM 9.4 09/08/2022   PROT 7.1 09/08/2022   ALBUMIN 4.2 09/08/2022   BILITOT 0.6 09/08/2022   ALKPHOS 51 09/08/2022   AST 17 09/08/2022   ALT 14 09/08/2022   ANIONGAP 9 01/05/2016      Hyperlipidemia Lab Results  Component Value Date  CHOL 148 09/08/2022   CHOL 162 08/27/2021   CHOL 150 08/16/2020   Lab Results  Component Value Date   HDL 47.10 09/08/2022   HDL 46.20 08/27/2021   HDL 41.70 08/16/2020   Lab Results  Component Value Date   LDLCALC 84 09/08/2022   LDLCALC 98 08/27/2021   LDLCALC 85 08/16/2020   Lab Results  Component Value Date   TRIG 85.0 09/08/2022   TRIG 92.0 08/27/2021   TRIG 119.0 08/16/2020   Lab Results  Component Value Date   CHOLHDL 3 09/08/2022   CHOLHDL 4 08/27/2021   CHOLHDL 4 08/16/2020   Lab Results  Component Value Date   LDLDIRECT 179.6 03/02/2009   Simvastatin 10 mg daily    Patient Active Problem List   Diagnosis Date Noted   Sinus pressure 04/10/2022   Encounter for screening mammogram for breast cancer 09/02/2021   Screening mammogram, encounter for 06/03/2017   Second hand smoke exposure 01/09/2016   Estrogen deficiency 04/04/2015   Encounter for Medicare annual wellness exam 09/30/2013   Family history of aortic aneurysm 09/30/2013   Routine general  medical examination at a health care facility 11/17/2011   Hypertension 09/24/2010   POSTMENOPAUSAL STATUS 06/24/2010   Vitamin D deficiency 03/02/2009   History of colon polyps - adenomas 12/17/2007   Hyperlipidemia 10/05/2007   SINUSITIS, RECURRENT 10/05/2007   Allergic rhinitis 10/05/2007   Osteoporosis 10/05/2007   NEPHROLITHIASIS, HX OF 10/05/2007   Past Medical History:  Diagnosis Date   Allergy    allergic rhinitis   Arthritis    History of colon polyps - adenomas 12/17/2007   Hyperlipidemia    Hypertension    Kidney stone    Nephrolithiasis    Hx of   Osteopenia    Past Surgical History:  Procedure Laterality Date   ABDOMINAL HYSTERECTOMY  04/21/1978   total for bleeding   CESAREAN SECTION  04/22/1967   COLONOSCOPY     DILATION AND CURETTAGE OF UTERUS     x2   EYE SURGERY  03/11/2021   eye surgey  02/18/2021   Social History   Tobacco Use   Smoking status: Never    Passive exposure: Yes (husband is a smoker but she doesn't smoke and never has.)   Smokeless tobacco: Never   Tobacco comments:    husband smokes  Vaping Use   Vaping Use: Never used  Substance Use Topics   Alcohol use: No    Alcohol/week: 0.0 standard drinks of alcohol   Drug use: No   Family History  Problem Relation Age of Onset   Hypertension Mother    Osteoporosis Mother    Heart disease Father        CABG,CAD,MI   Kidney failure Father    Osteoporosis Father        ? of OP pt had hip fx.   Hypertension Brother    Colon cancer Neg Hx    Pancreatic cancer Neg Hx    Rectal cancer Neg Hx    Stomach cancer Neg Hx    Breast cancer Neg Hx    No Known Allergies Current Outpatient Medications on File Prior to Visit  Medication Sig Dispense Refill   albuterol (VENTOLIN HFA) 108 (90 Base) MCG/ACT inhaler INHALE 1 TO 2 PUFFS BY MOUTH EVERY 4 HOURS (Patient taking differently: Inhale 2 puffs into the lungs every 6 (six) hours as needed. INHALE 1 TO 2 PUFFS BY MOUTH EVERY 4 HOURS) 6.7 g 1    aspirin  81 MG tablet Take 81 mg by mouth daily.     cholecalciferol (VITAMIN D) 1000 UNITS tablet Take 2,000 Units by mouth daily.     alendronate (FOSAMAX) 70 MG tablet Take 1 tablet (70 mg total) by mouth every 7 (seven) days. Take with a full glass of water on an empty stomach. 12 tablet 2   No current facility-administered medications on file prior to visit.    Review of Systems  Constitutional:  Negative for activity change, appetite change, fatigue, fever and unexpected weight change.  HENT:  Negative for congestion, ear pain, rhinorrhea, sinus pressure and sore throat.   Eyes:  Negative for pain, redness and visual disturbance.  Respiratory:  Negative for cough, shortness of breath and wheezing.   Cardiovascular:  Negative for chest pain and palpitations.  Gastrointestinal:  Negative for abdominal pain, blood in stool, constipation and diarrhea.  Endocrine: Negative for polydipsia and polyuria.  Genitourinary:  Negative for dysuria, frequency and urgency.  Musculoskeletal:  Negative for arthralgias, back pain and myalgias.  Skin:  Negative for pallor and rash.  Allergic/Immunologic: Negative for environmental allergies.  Neurological:  Negative for dizziness, syncope and headaches.  Hematological:  Negative for adenopathy. Does not bruise/bleed easily.  Psychiatric/Behavioral:  Negative for decreased concentration and dysphoric mood. The patient is not nervous/anxious.        Grief       Objective:   Physical Exam Constitutional:      General: She is not in acute distress.    Appearance: Normal appearance. She is well-developed and normal weight. She is not ill-appearing or diaphoretic.  HENT:     Head: Normocephalic and atraumatic.     Right Ear: Tympanic membrane, ear canal and external ear normal.     Left Ear: Tympanic membrane, ear canal and external ear normal.     Nose: Nose normal. No congestion.     Mouth/Throat:     Mouth: Mucous membranes are moist.      Pharynx: Oropharynx is clear. No posterior oropharyngeal erythema.  Eyes:     General: No scleral icterus.    Extraocular Movements: Extraocular movements intact.     Conjunctiva/sclera: Conjunctivae normal.     Pupils: Pupils are equal, round, and reactive to light.  Neck:     Thyroid: No thyromegaly.     Vascular: No carotid bruit or JVD.  Cardiovascular:     Rate and Rhythm: Normal rate and regular rhythm.     Pulses: Normal pulses.     Heart sounds: Normal heart sounds.     No gallop.  Pulmonary:     Effort: Pulmonary effort is normal. No respiratory distress.     Breath sounds: Normal breath sounds. No wheezing.     Comments: Good air exch Chest:     Chest wall: No tenderness.  Abdominal:     General: Bowel sounds are normal. There is no distension or abdominal bruit.     Palpations: Abdomen is soft. There is no mass.     Tenderness: There is no abdominal tenderness.     Hernia: No hernia is present.  Genitourinary:    Comments: Breast exam: No mass, nodules, thickening, tenderness, bulging, retraction, inflamation, nipple discharge or skin changes noted.  No axillary or clavicular LA.     Musculoskeletal:        General: No tenderness. Normal range of motion.     Cervical back: Normal range of motion and neck supple. No rigidity. No muscular tenderness.  Right lower leg: No edema.     Left lower leg: No edema.     Comments: No kyphosis   Lymphadenopathy:     Cervical: No cervical adenopathy.  Skin:    General: Skin is warm and dry.     Coloration: Skin is not pale.     Findings: No erythema or rash.     Comments: Solar lentigines diffusely Some sks   Neurological:     Mental Status: She is alert. Mental status is at baseline.     Cranial Nerves: No cranial nerve deficit.     Motor: No abnormal muscle tone.     Coordination: Coordination normal.     Gait: Gait normal.     Deep Tendon Reflexes: Reflexes are normal and symmetric. Reflexes normal.  Psychiatric:         Mood and Affect: Mood normal.        Cognition and Memory: Cognition and memory normal.           Assessment & Plan:   Problem List Items Addressed This Visit       Cardiovascular and Mediastinum   Hypertension    BP: (!) 149/78   Not at goal Will increase lisinopril hct to a whole pill of 10-12.5   F/u in about a month or earlier if needed  Discussed lifstsyle habits      Relevant Medications   lisinopril-hydrochlorothiazide (ZESTORETIC) 20-12.5 MG tablet   simvastatin (ZOCOR) 10 MG tablet     Musculoskeletal and Integument   Osteoporosis    Plans to try alendronate again-unsure if it made her feel generally bad   Dexa reviewed 11/2021 Disc need for calcium/ vitamin D/ wt bearing exercise and bone density test every 2 y to monitor Disc safety/ fracture risk in detail          Other   History of colon polyps - adenomas   Hyperlipidemia    Disc goals for lipids and reasons to control them Rev last labs with pt Rev low sat fat diet in detail Stable Continue simvastatin 10 mg daily      Relevant Medications   lisinopril-hydrochlorothiazide (ZESTORETIC) 20-12.5 MG tablet   simvastatin (ZOCOR) 10 MG tablet   Routine general medical examination at a health care facility - Primary    Reviewed health habits including diet and exercise and skin cancer prevention Reviewed appropriate screening tests for age  Also reviewed health mt list, fam hx and immunization status , as well as social and family history   See HPI Labs reviewed and ordered Plans to reach out to pharmacy to get shingrix and tetanus shots  Cologuard neg 09/2020 -good for 3 y  Dexa utd 11/2021-discussed OSTEOPOROSIS treatment and fall prevention  PHQ 0       Vitamin D deficiency    Last vitamin D Lab Results  Component Value Date   VD25OH 35.63 09/08/2022  Vitamin D level is therapeutic with current supplementation Disc importance of this to bone and overall health

## 2022-09-11 NOTE — Patient Instructions (Addendum)
If you are interested in the new shingles vaccine (Shingrix) - call your local pharmacy to check on coverage and availability  If affordable, get on a wait list at your pharmacy to get the vaccine.  Also inquire about a tetanus shot at the pharmacy   Consider trying alendronate one more time and let us know if it does not work out   Increase your vitamin D to 2000 iu daily   For exercise Do some walking or pedaling or other cardio - to raise heart rate Add some strength training to your routine, this is important for bone and brain health and can reduce your risk of falls and help your body use insulin properly and regulate weight  Light weights, exercise bands , and internet videos are a good way to start  Yoga (chair or regular), machines , floor exercises or a gym with machines are also good options    Increase lisinopril hct to 1 whole pill per day If any side effects or problems let us know   Follow up in about a month

## 2022-09-11 NOTE — Assessment & Plan Note (Signed)
Last vitamin D Lab Results  Component Value Date   VD25OH 35.63 09/08/2022   Vitamin D level is therapeutic with current supplementation Disc importance of this to bone and overall health

## 2022-09-11 NOTE — Assessment & Plan Note (Signed)
Plans to try alendronate again-unsure if it made her feel generally bad   Dexa reviewed 11/2021 Disc need for calcium/ vitamin D/ wt bearing exercise and bone density test every 2 y to monitor Disc safety/ fracture risk in detail

## 2022-09-11 NOTE — Assessment & Plan Note (Signed)
Reviewed health habits including diet and exercise and skin cancer prevention Reviewed appropriate screening tests for age  Also reviewed health mt list, fam hx and immunization status , as well as social and family history   See HPI Labs reviewed and ordered Plans to reach out to pharmacy to get shingrix and tetanus shots  Cologuard neg 09/2020 -good for 3 y  Dexa utd 11/2021-discussed OSTEOPOROSIS treatment and fall prevention  PHQ 0

## 2022-10-13 ENCOUNTER — Encounter: Payer: Self-pay | Admitting: Family Medicine

## 2022-10-13 ENCOUNTER — Ambulatory Visit (INDEPENDENT_AMBULATORY_CARE_PROVIDER_SITE_OTHER): Payer: Medicare Other | Admitting: Family Medicine

## 2022-10-13 VITALS — BP 121/72 | HR 77 | Temp 97.9°F | Ht 64.5 in | Wt 141.4 lb

## 2022-10-13 DIAGNOSIS — I10 Essential (primary) hypertension: Secondary | ICD-10-CM

## 2022-10-13 LAB — BASIC METABOLIC PANEL
BUN: 12 mg/dL (ref 6–23)
CO2: 30 mEq/L (ref 19–32)
Calcium: 9.5 mg/dL (ref 8.4–10.5)
Chloride: 101 mEq/L (ref 96–112)
Creatinine, Ser: 0.62 mg/dL (ref 0.40–1.20)
GFR: 86.88 mL/min (ref >60.00)
Glucose, Bld: 91 mg/dL (ref 70–99)
Potassium: 3.8 mEq/L (ref 3.5–5.1)
Sodium: 138 mEq/L (ref 135–145)

## 2022-10-13 NOTE — Patient Instructions (Addendum)
OMRON for the arm size regular - is my favorite cuff   Your cuff today is close to the reading I got   Take care of yourself   Labs today   Continue current medicines

## 2022-10-13 NOTE — Progress Notes (Signed)
Subjective:    Patient ID: Colleen Cochran, female    DOB: Oct 11, 1945, 77 y.o.   MRN: [id number]  HPI  Wt Readings from Last 3 Encounters:  10/13/22 141 lb 6 oz (64.1 kg)  09/11/22 140 lb 4 oz (63.6 kg)  08/28/22 143 lb (64.9 kg)   23.89 kg/m  Vitals:   10/13/22 0915 10/13/22 0944  BP: 136/86 121/72  Pulse: 77   Temp: 97.9 F (36.6 C)   SpO2: 97%    Pt presents for follow up of HTN   BP Readings from Last 3 Encounters:  10/13/22 121/72  09/11/22 (!) 149/78  08/28/22 125/75    Last visit increase lisinopril  hct to one whole pill of 10-12.5 mg daily  Discussed lifestyle habits   No side effects   Her cuff runs low   Eating healthy  Working on lifestyle change  Trying to walk more    Patient Active Problem List   Diagnosis Date Noted   Sinus pressure 04/10/2022   Encounter for screening mammogram for breast cancer 09/02/2021   Screening mammogram, encounter for 06/03/2017   Second hand smoke exposure 01/09/2016   Estrogen deficiency 04/04/2015   Encounter for Medicare annual wellness exam 09/30/2013   Family history of aortic aneurysm 09/30/2013   Routine general medical examination at a health care facility 11/17/2011   Hypertension 09/24/2010   POSTMENOPAUSAL STATUS 06/24/2010   Vitamin D deficiency 03/02/2009   History of colon polyps - adenomas 12/17/2007   Hyperlipidemia 10/05/2007   SINUSITIS, RECURRENT 10/05/2007   Allergic rhinitis 10/05/2007   Osteoporosis 10/05/2007   NEPHROLITHIASIS, HX OF 10/05/2007   Past Medical History:  Diagnosis Date   Allergy    allergic rhinitis   Arthritis    History of colon polyps - adenomas 12/17/2007   Hyperlipidemia    Hypertension    Kidney stone    Nephrolithiasis    Hx of   Osteopenia    Past Surgical History:  Procedure Laterality Date   ABDOMINAL HYSTERECTOMY  04/21/1978   total for bleeding   CESAREAN SECTION  04/22/1967   COLONOSCOPY     DILATION AND CURETTAGE OF UTERUS     x2   EYE  SURGERY  03/11/2021   eye surgey  02/18/2021   Social History   Tobacco Use   Smoking status: Never    Passive exposure: Yes (husband is a smoker but she doesn't smoke and never has.)   Smokeless tobacco: Never   Tobacco comments:    husband smokes  Vaping Use   Vaping Use: Never used  Substance Use Topics   Alcohol use: No    Alcohol/week: 0.0 standard drinks of alcohol   Drug use: No   Family History  Problem Relation Age of Onset   Hypertension Mother    Osteoporosis Mother    Heart disease Father        CABG,CAD,MI   Kidney failure Father    Osteoporosis Father        ? of OP pt had hip fx.   Hypertension Brother    Colon cancer Neg Hx    Pancreatic cancer Neg Hx    Rectal cancer Neg Hx    Stomach cancer Neg Hx    Breast cancer Neg Hx    No Known Allergies Current Outpatient Medications on File Prior to Visit  Medication Sig Dispense Refill   albuterol (VENTOLIN HFA) 108 (90 Base) MCG/ACT inhaler INHALE 1 TO 2 PUFFS BY MOUTH EVERY 4  HOURS (Patient taking differently: Inhale 2 puffs into the lungs every 6 (six) hours as needed. INHALE 1 TO 2 PUFFS BY MOUTH EVERY 4 HOURS) 6.7 g 1   alendronate (FOSAMAX) 70 MG tablet Take 1 tablet (70 mg total) by mouth every 7 (seven) days. Take with a full glass of water on an empty stomach. 12 tablet 2   aspirin 81 MG tablet Take 81 mg by mouth daily.     cholecalciferol (VITAMIN D) 1000 UNITS tablet Take 2,000 Units by mouth daily.     lisinopril-hydrochlorothiazide (ZESTORETIC) 20-12.5 MG tablet Take 1 tablet by mouth daily. 90 tablet 1   simvastatin (ZOCOR) 10 MG tablet TAKE 1 TABLET(10 MG) BY MOUTH DAILY 90 tablet 3   No current facility-administered medications on file prior to visit.    Review of Systems  Constitutional:  Negative for activity change, appetite change, fatigue, fever and unexpected weight change.  HENT:  Negative for congestion, ear pain, rhinorrhea, sinus pressure and sore throat.   Eyes:  Negative for  pain, redness and visual disturbance.  Respiratory:  Negative for cough, shortness of breath and wheezing.   Cardiovascular:  Negative for chest pain and palpitations.  Gastrointestinal:  Negative for abdominal pain, blood in stool, constipation and diarrhea.  Endocrine: Negative for polydipsia and polyuria.  Genitourinary:  Negative for dysuria, frequency and urgency.  Musculoskeletal:  Negative for arthralgias, back pain and myalgias.  Skin:  Negative for pallor and rash.  Allergic/Immunologic: Negative for environmental allergies.  Neurological:  Negative for dizziness, syncope and headaches.  Hematological:  Negative for adenopathy. Does not bruise/bleed easily.  Psychiatric/Behavioral:  Negative for decreased concentration and dysphoric mood. The patient is not nervous/anxious.        Some stressors        Objective:   Physical Exam Constitutional:      General: She is not in acute distress.    Appearance: Normal appearance. She is well-developed and normal weight. She is not ill-appearing or diaphoretic.  HENT:     Head: Normocephalic and atraumatic.  Eyes:     Conjunctiva/sclera: Conjunctivae normal.     Pupils: Pupils are equal, round, and reactive to light.  Neck:     Thyroid: No thyromegaly.     Vascular: No carotid bruit or JVD.  Cardiovascular:     Rate and Rhythm: Normal rate and regular rhythm.     Heart sounds: Normal heart sounds.     No gallop.  Pulmonary:     Effort: Pulmonary effort is normal. No respiratory distress.     Breath sounds: Normal breath sounds. No wheezing or rales.  Abdominal:     General: There is no distension or abdominal bruit.  Musculoskeletal:     Cervical back: Normal range of motion and neck supple.     Right lower leg: No edema.     Left lower leg: No edema.  Lymphadenopathy:     Cervical: No cervical adenopathy.  Skin:    General: Skin is warm and dry.     Coloration: Skin is not pale.     Findings: No rash.  Neurological:      Mental Status: She is alert.     Coordination: Coordination normal.     Deep Tendon Reflexes: Reflexes are normal and symmetric. Reflexes normal.  Psychiatric:        Mood and Affect: Mood normal.           Assessment & Plan:   Problem List Items Addressed  This Visit       Cardiovascular and Mediastinum   Hypertension - Primary    Improved with lisinopril hct 10-12.5 daily  .bp in fair control at this time  BP Readings from Last 1 Encounters:  10/13/22 121/72  No changes needed Most recent labs reviewed  Disc lifstyle change with low sodium diet and exercise  Lab today      Relevant Orders   Basic metabolic panel

## 2022-10-13 NOTE — Assessment & Plan Note (Signed)
Improved with lisinopril hct 10-12.5 daily  .bp in fair control at this time  BP Readings from Last 1 Encounters:  10/13/22 121/72   No changes needed Most recent labs reviewed  Disc lifstyle change with low sodium diet and exercise  Lab today

## 2022-10-14 ENCOUNTER — Other Ambulatory Visit: Payer: Self-pay

## 2022-10-14 MED ORDER — LISINOPRIL-HYDROCHLOROTHIAZIDE 20-12.5 MG PO TABS: 1.0000 | ORAL_TABLET | Freq: Every day | ORAL | 3 refills | Status: DC

## 2022-12-27 ENCOUNTER — Other Ambulatory Visit: Payer: Self-pay | Admitting: Family Medicine

## 2023-03-21 ENCOUNTER — Other Ambulatory Visit (HOSPITAL_COMMUNITY): Payer: Self-pay

## 2023-04-03 ENCOUNTER — Encounter: Payer: Self-pay | Admitting: Family Medicine

## 2023-04-03 ENCOUNTER — Ambulatory Visit (INDEPENDENT_AMBULATORY_CARE_PROVIDER_SITE_OTHER): Payer: Medicare Other | Admitting: Family Medicine

## 2023-04-03 VITALS — BP 132/74 | HR 87 | Temp 98.5°F | Ht 64.5 in | Wt 142.4 lb

## 2023-04-03 DIAGNOSIS — M810 Age-related osteoporosis without current pathological fracture: Secondary | ICD-10-CM

## 2023-04-03 DIAGNOSIS — K5790 Diverticulosis of intestine, part unspecified, without perforation or abscess without bleeding: Secondary | ICD-10-CM | POA: Insufficient documentation

## 2023-04-03 DIAGNOSIS — R140 Abdominal distension (gaseous): Secondary | ICD-10-CM | POA: Diagnosis not present

## 2023-04-03 DIAGNOSIS — G2581 Restless legs syndrome: Secondary | ICD-10-CM | POA: Diagnosis not present

## 2023-04-03 DIAGNOSIS — I459 Conduction disorder, unspecified: Secondary | ICD-10-CM | POA: Insufficient documentation

## 2023-04-03 DIAGNOSIS — R1013 Epigastric pain: Secondary | ICD-10-CM | POA: Diagnosis not present

## 2023-04-03 LAB — CBC WITH DIFFERENTIAL/PLATELET
Basophils Absolute: 0.1 10*3/uL (ref 0.0–0.1)
Basophils Relative: 1.1 % (ref 0.0–3.0)
Eosinophils Absolute: 0 10*3/uL (ref 0.0–0.7)
Eosinophils Relative: 0.7 % (ref 0.0–5.0)
HCT: 40.4 % (ref 36.0–46.0)
Hemoglobin: 13.3 g/dL (ref 12.0–15.0)
Lymphocytes Relative: 21.8 % (ref 12.0–46.0)
Lymphs Abs: 1 10*3/uL (ref 0.7–4.0)
MCHC: 33 g/dL (ref 30.0–36.0)
MCV: 91.6 fL (ref 78.0–100.0)
Monocytes Absolute: 0.4 10*3/uL (ref 0.1–1.0)
Monocytes Relative: 8.8 % (ref 3.0–12.0)
Neutro Abs: 3.2 10*3/uL (ref 1.4–7.7)
Neutrophils Relative %: 67.6 % (ref 43.0–77.0)
Platelets: 351 10*3/uL (ref 150.0–400.0)
RBC: 4.4 Mil/uL (ref 3.87–5.11)
RDW: 13.5 % (ref 11.5–15.5)
WBC: 4.7 10*3/uL (ref 4.0–10.5)

## 2023-04-03 LAB — HEPATIC FUNCTION PANEL
ALT: 14 U/L (ref 0–35)
AST: 15 U/L (ref 0–37)
Albumin: 4.5 g/dL (ref 3.5–5.2)
Alkaline Phosphatase: 49 U/L (ref 39–117)
Bilirubin, Direct: 0.1 mg/dL (ref 0.0–0.3)
Total Bilirubin: 0.6 mg/dL (ref 0.2–1.2)
Total Protein: 7.2 g/dL (ref 6.0–8.3)

## 2023-04-03 LAB — BASIC METABOLIC PANEL
BUN: 16 mg/dL (ref 6–23)
CO2: 32 meq/L (ref 19–32)
Calcium: 9.4 mg/dL (ref 8.4–10.5)
Chloride: 100 meq/L (ref 96–112)
Creatinine, Ser: 0.68 mg/dL (ref 0.40–1.20)
GFR: 84.69 mL/min (ref >60.00)
Glucose, Bld: 105 mg/dL — ABNORMAL HIGH (ref 70–99)
Potassium: 3.4 meq/L — ABNORMAL LOW (ref 3.5–5.1)
Sodium: 141 meq/L (ref 135–145)

## 2023-04-03 LAB — LIPASE: Lipase: 32 U/L (ref 11.0–59.0)

## 2023-04-03 LAB — IRON: Iron: 98 ug/dL (ref 42–145)

## 2023-04-03 MED ORDER — FAMOTIDINE 20 MG PO TABS: 20.0000 mg | ORAL_TABLET | Freq: Two times a day (BID) | ORAL | 1 refills | Status: DC

## 2023-04-03 NOTE — Assessment & Plan Note (Signed)
Epigastric and esophageal  Some throat clearing also / ? Globus sensation  Wonder about GERD Does have heartburn also   Instructed to avoid nsaids and other triggers Pepcid 20 mg bid Lab today  Follow up in 1-2 weeks

## 2023-04-03 NOTE — Progress Notes (Unsigned)
Subjective:    Patient ID: Colleen Cochran, female    DOB: 01-11-1946, 77 y.o.   MRN: [id number]  HPI  Wt Readings from Last 3 Encounters:  04/03/23 142 lb 6 oz (64.6 kg)  10/13/22 141 lb 6 oz (64.1 kg)  09/11/22 140 lb 4 oz (63.6 kg)   24.06 kg/m  Vitals:   04/03/23 0909  BP: 132/74  Pulse: 87  Temp: 98.5 F (36.9 C)  SpO2: 97%   Pt presents with GI complaints Bloating  Restless legs   Off and on has discomfort in esophagus  Pressure sensation and some soreness  Also bloating in her lower abdomen   Throat discomfort -but not sore  Does clear throat a lot   Does get heartburn about once per week   Flares after eating nuts  Tomatoes do bother her   No n/v  Some constipation at times (has not taken)  No blood in stool   Tries to eat a lot of fruits and veggies  Drinks lots of water      Colonoscopy 11/2013 with severe diverticulosis  10 y recall  Had polyps in 2009   History of hysterectomy in past  Was a total     Over the counter Alka selzer  Rolaids Both help   No exertional symptoms but it out of shape   No etoh   Nsaids - once a month   Did take a covid test this week  Some sinus pressure on and off   Has some restless legs   No exercise   Is anxious- mood   Patient Active Problem List   Diagnosis Date Noted   Epigastric pain 04/03/2023   Bloating 04/03/2023   Diverticulosis 04/03/2023   Restless legs 04/03/2023   Skipped beats 04/03/2023   Sinus pressure 04/10/2022   Encounter for screening mammogram for breast cancer 09/02/2021   Screening mammogram, encounter for 06/03/2017   Second hand smoke exposure 01/09/2016   Estrogen deficiency 04/04/2015   Encounter for Medicare annual wellness exam 09/30/2013   Family history of aortic aneurysm 09/30/2013   Routine general medical examination at a health care facility 11/17/2011   Hypertension 09/24/2010   Asymptomatic postmenopausal status 06/24/2010   Vitamin D deficiency  03/02/2009   History of colon polyps - adenomas 12/17/2007   Hyperlipidemia 10/05/2007   Sinusitis, chronic 10/05/2007   Allergic rhinitis 10/05/2007   Osteoporosis 10/05/2007   NEPHROLITHIASIS, HX OF 10/05/2007   Past Medical History:  Diagnosis Date   Allergy    allergic rhinitis   Arthritis    History of colon polyps - adenomas 12/17/2007   Hyperlipidemia    Hypertension    Kidney stone    Nephrolithiasis    Hx of   Osteopenia    Past Surgical History:  Procedure Laterality Date   ABDOMINAL HYSTERECTOMY  04/21/1978   total for bleeding   CESAREAN SECTION  04/22/1967   COLONOSCOPY     DILATION AND CURETTAGE OF UTERUS     x2   EYE SURGERY  03/11/2021   eye surgey  02/18/2021   Social History   Tobacco Use   Smoking status: Never    Passive exposure: Yes (husband is a smoker but she doesn't smoke and never has.)   Smokeless tobacco: Never   Tobacco comments:    husband smokes  Vaping Use   Vaping status: Never Used  Substance Use Topics   Alcohol use: No    Alcohol/week: 0.0 standard drinks of  alcohol   Drug use: No   Family History  Problem Relation Age of Onset   Hypertension Mother    Osteoporosis Mother    Heart disease Father        CABG,CAD,MI   Kidney failure Father    Osteoporosis Father        ? of OP pt had hip fx.   Hypertension Brother    Colon cancer Neg Hx    Pancreatic cancer Neg Hx    Rectal cancer Neg Hx    Stomach cancer Neg Hx    Breast cancer Neg Hx    No Known Allergies Current Outpatient Medications on File Prior to Visit  Medication Sig Dispense Refill   albuterol (VENTOLIN HFA) 108 (90 Base) MCG/ACT inhaler INHALE 1 TO 2 PUFFS BY MOUTH EVERY 4 HOURS (Patient taking differently: Inhale 2 puffs into the lungs every 6 (six) hours as needed. INHALE 1 TO 2 PUFFS BY MOUTH EVERY 4 HOURS) 6.7 g 1   aspirin 81 MG tablet Take 81 mg by mouth daily.     cholecalciferol (VITAMIN D) 1000 UNITS tablet Take 2,000 Units by mouth daily.      lisinopril-hydrochlorothiazide (ZESTORETIC) 20-12.5 MG tablet Take 1 tablet by mouth daily. 90 tablet 3   simvastatin (ZOCOR) 10 MG tablet TAKE 1 TABLET(10 MG) BY MOUTH DAILY 90 tablet 3   No current facility-administered medications on file prior to visit.    Review of Systems  Constitutional:  Negative for activity change, appetite change, fatigue, fever and unexpected weight change.  HENT:  Negative for congestion, rhinorrhea, sore throat, trouble swallowing and voice change.        Clears throat a lot  Feels like something is in it   Eyes:  Negative for pain, redness, itching and visual disturbance.  Respiratory:  Negative for cough, chest tightness, shortness of breath and wheezing.   Cardiovascular:  Negative for chest pain, palpitations and leg swelling.       Occational feels like she skips a heart beat but no palpitations   Gastrointestinal:  Positive for abdominal distention and abdominal pain. Negative for anal bleeding, blood in stool, constipation, diarrhea, nausea, rectal pain and vomiting.       Bloating  Some discomfort in upper abdomen and esophagus area and throat   Endocrine: Negative for cold intolerance, heat intolerance, polydipsia and polyuria.  Genitourinary:  Negative for difficulty urinating, dysuria, frequency and urgency.  Musculoskeletal:  Negative for arthralgias, joint swelling and myalgias.  Skin:  Negative for pallor and rash.  Neurological:  Negative for dizziness, tremors, weakness, numbness and headaches.       Legs are restless  Hematological:  Negative for adenopathy. Does not bruise/bleed easily.  Psychiatric/Behavioral:  Negative for decreased concentration and dysphoric mood. The patient is nervous/anxious.        Objective:   Physical Exam Constitutional:      General: She is not in acute distress.    Appearance: Normal appearance. She is well-developed and normal weight. She is not ill-appearing or diaphoretic.  HENT:     Head:  Normocephalic and atraumatic.     Mouth/Throat:     Mouth: Mucous membranes are moist.  Eyes:     Conjunctiva/sclera: Conjunctivae normal.     Pupils: Pupils are equal, round, and reactive to light.  Neck:     Thyroid: No thyromegaly.     Vascular: No carotid bruit or JVD.  Cardiovascular:     Rate and Rhythm: Normal rate and  regular rhythm.     Heart sounds: Normal heart sounds.     No gallop.  Pulmonary:     Effort: Pulmonary effort is normal. No respiratory distress.     Breath sounds: Normal breath sounds. No wheezing or rales.  Abdominal:     General: Abdomen is flat. Bowel sounds are normal. There is no distension or abdominal bruit.     Palpations: Abdomen is soft. There is no fluid wave, hepatomegaly, splenomegaly, mass or pulsatile mass.     Tenderness: There is abdominal tenderness in the right upper quadrant, right lower quadrant, epigastric area, left upper quadrant and left lower quadrant. There is no right CVA tenderness, left CVA tenderness or guarding. Negative signs include Murphy's sign and McBurney's sign.     Hernia: No hernia is present.     Comments: Mild tenderness of upper abd / worse epigastric No murphy sign   Mild discomfort in LQs with deep palpation as well     Musculoskeletal:     Cervical back: Normal range of motion and neck supple.     Right lower leg: No edema.     Left lower leg: No edema.  Lymphadenopathy:     Cervical: No cervical adenopathy.  Skin:    General: Skin is warm and dry.     Coloration: Skin is not pale.     Findings: No rash.  Neurological:     Mental Status: She is alert.     Coordination: Coordination normal.     Deep Tendon Reflexes: Reflexes are normal and symmetric. Reflexes normal.  Psychiatric:        Mood and Affect: Mood normal.           Assessment & Plan:   Problem List Items Addressed This Visit       Digestive   Diverticulosis   Severe, noted on colonoscopy 2015  Some bloating recently / eating  nuts  Occational constipation   Discussed importance of high fiber diet  Instructed for now to avoid nuts/seeds/corn/popcorn   Keep up fluids  Colonoscopy due 11/2023          Musculoskeletal and Integument   Osteoporosis   Off alendronate Made her feel bad        Other   Skipped beats   Pt feels like she occational skips a beat  Mentions on way out  No palpitations  Is generally out of shape  Also symptoms of GERD  Will follow up 1-2 weeks after treatment of GERD to eval       Restless legs   Pt mentions this at end of visit  Will add iron level to labs       Relevant Orders   Iron   Epigastric pain - Primary   Epigastric and esophageal  Some throat clearing also / ? Globus sensation  Wonder about GERD Does have heartburn also   Instructed to avoid nsaids and other triggers Pepcid 20 mg bid Lab today  Follow up in 1-2 weeks       Relevant Orders   CBC with Differential/Platelet   Basic metabolic panel   Hepatic function panel   Iron   Lipase   Bloating   ? If related to constipation and diverticulosis   Discussed getting more fiber and fluids Avoid triggers incl nuts/seeds/corn  Noted history of total hysterectomy as well  Adhesions are possible Reassuring exam      Relevant Orders   CBC with Differential/Platelet   Basic metabolic  panel   Hepatic function panel   Iron   Lipase

## 2023-04-03 NOTE — Assessment & Plan Note (Signed)
Pt feels like she occational skips a beat  Mentions on way out  No palpitations  Is generally out of shape  Also symptoms of GERD  Will follow up 1-2 weeks after treatment of GERD to eval

## 2023-04-03 NOTE — Assessment & Plan Note (Signed)
Off alendronate Made her feel bad

## 2023-04-03 NOTE — Assessment & Plan Note (Signed)
Severe, noted on colonoscopy 2015  Some bloating recently / eating nuts  Occational constipation   Discussed importance of high fiber diet  Instructed for now to avoid nuts/seeds/corn/popcorn   Keep up fluids  Colonoscopy due 11/2023

## 2023-04-03 NOTE — Assessment & Plan Note (Addendum)
?   If related to constipation and diverticulosis   Discussed getting more fiber and fluids Avoid triggers incl nuts/seeds/corn  Noted history of total hysterectomy as well  Adhesions are possible Reassuring exam

## 2023-04-03 NOTE — Assessment & Plan Note (Signed)
Pt mentions this at end of visit  Will add iron level to labs

## 2023-04-03 NOTE — Patient Instructions (Addendum)
Avoid foods that upset your GI system   Avoid nuts, seeds and corn /popcorn   Eat a high fiber diet  Keep drinking lots of water   Labs today  Including iron level for restless legs    Start pepcid 20 mg twice daily (this lowers acid in stomach and esophagus)   Follow up in 1-2 weeks   If symptoms suddenly worsen or if they are severe-go to the ER

## 2023-04-10 ENCOUNTER — Telehealth: Payer: Self-pay | Admitting: Family Medicine

## 2023-04-10 NOTE — Telephone Encounter (Signed)
Patient is returning a call from the office she would like a call back there where no notes left in the patient chart

## 2023-04-10 NOTE — Telephone Encounter (Signed)
Called and advised patient call was likely an appointment reminder for appt next week with Dr. Milinda Antis

## 2023-04-17 ENCOUNTER — Ambulatory Visit (INDEPENDENT_AMBULATORY_CARE_PROVIDER_SITE_OTHER): Payer: Medicare Other | Admitting: Family Medicine

## 2023-04-17 ENCOUNTER — Encounter: Payer: Self-pay | Admitting: Family Medicine

## 2023-04-17 VITALS — BP 120/65 | HR 96 | Temp 98.6°F | Ht 64.5 in | Wt 142.1 lb

## 2023-04-17 DIAGNOSIS — K5790 Diverticulosis of intestine, part unspecified, without perforation or abscess without bleeding: Secondary | ICD-10-CM

## 2023-04-17 DIAGNOSIS — Z1231 Encounter for screening mammogram for malignant neoplasm of breast: Secondary | ICD-10-CM

## 2023-04-17 DIAGNOSIS — R140 Abdominal distension (gaseous): Secondary | ICD-10-CM | POA: Diagnosis not present

## 2023-04-17 DIAGNOSIS — E876 Hypokalemia: Secondary | ICD-10-CM | POA: Insufficient documentation

## 2023-04-17 DIAGNOSIS — R1013 Epigastric pain: Secondary | ICD-10-CM | POA: Diagnosis not present

## 2023-04-17 DIAGNOSIS — J069 Acute upper respiratory infection, unspecified: Secondary | ICD-10-CM

## 2023-04-17 DIAGNOSIS — I10 Essential (primary) hypertension: Secondary | ICD-10-CM | POA: Diagnosis not present

## 2023-04-17 DIAGNOSIS — I459 Conduction disorder, unspecified: Secondary | ICD-10-CM

## 2023-04-17 DIAGNOSIS — J320 Chronic maxillary sinusitis: Secondary | ICD-10-CM

## 2023-04-17 MED ORDER — FAMOTIDINE 20 MG PO TABS
20.0000 mg | ORAL_TABLET | Freq: Two times a day (BID) | ORAL | 3 refills | Status: DC
Start: 2023-04-17 — End: 2024-02-25

## 2023-04-17 MED ORDER — POTASSIUM CHLORIDE CRYS ER 10 MEQ PO TBCR: 10.0000 meq | EXTENDED_RELEASE_TABLET | Freq: Two times a day (BID) | ORAL | 1 refills | Status: DC

## 2023-04-17 MED ORDER — FLUTICASONE PROPIONATE 50 MCG/ACT NA SUSP: 2.0000 | Freq: Every day | NASAL | 6 refills | Status: AC

## 2023-04-17 NOTE — Assessment & Plan Note (Signed)
This resolved with treatment / H2 blocker

## 2023-04-17 NOTE — Assessment & Plan Note (Signed)
Worse with viral uri  No signs and symptoms of bacterial sinusitis Will start flonase bid then every day  Update if not starting to improve in a week or if worsening  Call back and Er precautions noted in detail today    Reviewed signs and symptoms of bact infx to watch for

## 2023-04-17 NOTE — Progress Notes (Signed)
Subjective:    Patient ID: Colleen Cochran, female    DOB: 1946-02-12, 77 y.o.   MRN: [id number]  HPI  Wt Readings from Last 3 Encounters:  04/17/23 142 lb 2 oz (64.5 kg)  04/03/23 142 lb 6 oz (64.6 kg)  10/13/22 141 lb 6 oz (64.1 kg)   24.02 kg/m  Vitals:   04/17/23 0853 04/17/23 0920  BP: (!) 142/66 120/65  Pulse: 96   Temp: 98.6 F (37 C)   SpO2: 97%    Pt presents for  Follow up GI problems Leg problem  Sinus symptoms  Needs to get mammogram order / overdue    Last visit noted epigastric pain  Also globus sensation  Also sensation of skipped HB  We started pepcid 20 mg bid   Is much improved overall  Only had one day of pain but she ate poorly  Lump in throat is gone also   Still has bloating-that is chronic and improves if she eats well    Noted some restless legs Lab Results  Component Value Date   WBC 4.7 04/03/2023   HGB 13.3 04/03/2023   HCT 40.4 04/03/2023   MCV 91.6 04/03/2023   PLT 351.0 04/03/2023   Lab Results  Component Value Date   IRON 98 04/03/2023    K was slightly low Lab Results  Component Value Date   NA 141 04/03/2023   K 3.4 (L) 04/03/2023   CO2 32 04/03/2023   GLUCOSE 105 (H) 04/03/2023   BUN 16 04/03/2023   CREATININE 0.68 04/03/2023   CALCIUM 9.4 04/03/2023   GFR 84.69 04/03/2023   GFRNONAA >60 01/05/2016  Takes lisinopril hct  Encouraged high K foods  Does have some cramping in legs  Does wake up with that    bp is stable today  No cp or palpitations or headaches or edema  No side effects to medicines  BP Readings from Last 3 Encounters:  04/17/23 120/65  04/03/23 132/74  10/13/22 121/72      Sinus symptoms  Early this week- r ear itched and felt full  Pressure and pain over both eyes  Throat is scratchy  Pressure under the eyes  Clear mucous so for  Some cough - not severe and not productive  Covid test was negative  No fever   Over the counter : Sinus pill- not sudafed       Patient  Active Problem List   Diagnosis Date Noted   Hypokalemia 04/17/2023   Epigastric pain 04/03/2023   Bloating 04/03/2023   Diverticulosis 04/03/2023   Restless legs 04/03/2023   Sinus pressure 04/10/2022   Encounter for screening mammogram for breast cancer 09/02/2021   Screening mammogram, encounter for 06/03/2017   Viral URI 01/09/2016   Second hand smoke exposure 01/09/2016   Estrogen deficiency 04/04/2015   Encounter for Medicare annual wellness exam 09/30/2013   Family history of aortic aneurysm 09/30/2013   Routine general medical examination at a health care facility 11/17/2011   Hypertension 09/24/2010   Asymptomatic postmenopausal status 06/24/2010   Vitamin D deficiency 03/02/2009   History of colon polyps - adenomas 12/17/2007   Hyperlipidemia 10/05/2007   Sinusitis, chronic 10/05/2007   Allergic rhinitis 10/05/2007   Osteoporosis 10/05/2007   NEPHROLITHIASIS, HX OF 10/05/2007   Past Medical History:  Diagnosis Date   Allergy    allergic rhinitis   Arthritis    History of colon polyps - adenomas 12/17/2007   Hyperlipidemia    Hypertension  Kidney stone    Nephrolithiasis    Hx of   Osteopenia    Past Surgical History:  Procedure Laterality Date   ABDOMINAL HYSTERECTOMY  04/21/1978   total for bleeding   CESAREAN SECTION  04/22/1967   COLONOSCOPY     DILATION AND CURETTAGE OF UTERUS     x2   EYE SURGERY  03/11/2021   eye surgey  02/18/2021   Social History   Tobacco Use   Smoking status: Never    Passive exposure: Yes (husband is a smoker but she doesn't smoke and never has.)   Smokeless tobacco: Never   Tobacco comments:    husband smokes  Vaping Use   Vaping status: Never Used  Substance Use Topics   Alcohol use: No    Alcohol/week: 0.0 standard drinks of alcohol   Drug use: No   Family History  Problem Relation Age of Onset   Hypertension Mother    Osteoporosis Mother    Heart disease Father        CABG,CAD,MI   Kidney failure Father     Osteoporosis Father        ? of OP pt had hip fx.   Hypertension Brother    Colon cancer Neg Hx    Pancreatic cancer Neg Hx    Rectal cancer Neg Hx    Stomach cancer Neg Hx    Breast cancer Neg Hx    No Known Allergies Current Outpatient Medications on File Prior to Visit  Medication Sig Dispense Refill   albuterol (VENTOLIN HFA) 108 (90 Base) MCG/ACT inhaler INHALE 1 TO 2 PUFFS BY MOUTH EVERY 4 HOURS (Patient taking differently: Inhale 2 puffs into the lungs every 6 (six) hours as needed. INHALE 1 TO 2 PUFFS BY MOUTH EVERY 4 HOURS) 6.7 g 1   aspirin 81 MG tablet Take 81 mg by mouth daily.     cholecalciferol (VITAMIN D) 1000 UNITS tablet Take 2,000 Units by mouth daily.     lisinopril-hydrochlorothiazide (ZESTORETIC) 20-12.5 MG tablet Take 1 tablet by mouth daily. 90 tablet 3   simvastatin (ZOCOR) 10 MG tablet TAKE 1 TABLET(10 MG) BY MOUTH DAILY 90 tablet 3   No current facility-administered medications on file prior to visit.    Review of Systems  Constitutional:  Negative for activity change, appetite change, fatigue, fever and unexpected weight change.  HENT:  Positive for congestion, sinus pressure and sinus pain. Negative for ear pain, rhinorrhea and sore throat.   Eyes:  Negative for pain, redness and visual disturbance.  Respiratory:  Negative for cough, shortness of breath and wheezing.   Cardiovascular:  Negative for chest pain and palpitations.  Gastrointestinal:  Negative for abdominal distention, abdominal pain, blood in stool, constipation and diarrhea.  Endocrine: Negative for polydipsia and polyuria.  Genitourinary:  Negative for dysuria, frequency and urgency.  Musculoskeletal:  Negative for arthralgias, back pain and myalgias.  Skin:  Negative for pallor and rash.  Allergic/Immunologic: Negative for environmental allergies.  Neurological:  Negative for dizziness, syncope and headaches.  Hematological:  Negative for adenopathy. Does not bruise/bleed easily.   Psychiatric/Behavioral:  Negative for decreased concentration and dysphoric mood. The patient is not nervous/anxious.        Objective:   Physical Exam Constitutional:      General: She is not in acute distress.    Appearance: Normal appearance. She is well-developed and normal weight. She is not ill-appearing or diaphoretic.  HENT:     Head: Normocephalic and atraumatic.  Comments: Mild maxillary sinus tenderness    Right Ear: Tympanic membrane and ear canal normal.     Left Ear: Tympanic membrane and ear canal normal.     Nose:     Comments: Nares are boggy and mildly injected     Mouth/Throat:     Mouth: Mucous membranes are moist.     Pharynx: No oropharyngeal exudate or posterior oropharyngeal erythema.  Eyes:     General: No scleral icterus.       Right eye: No discharge.        Left eye: No discharge.     Conjunctiva/sclera: Conjunctivae normal.     Pupils: Pupils are equal, round, and reactive to light.  Neck:     Thyroid: No thyromegaly.     Vascular: No carotid bruit or JVD.  Cardiovascular:     Rate and Rhythm: Regular rhythm. Tachycardia present.     Heart sounds: Normal heart sounds.     No gallop.  Pulmonary:     Effort: Pulmonary effort is normal. No respiratory distress.     Breath sounds: Normal breath sounds. No stridor. No wheezing, rhonchi or rales.  Abdominal:     General: There is no distension or abdominal bruit.     Palpations: Abdomen is soft. There is no mass.     Tenderness: There is no abdominal tenderness. There is no guarding or rebound.  Musculoskeletal:     Cervical back: Normal range of motion and neck supple.     Right lower leg: No edema.     Left lower leg: No edema.  Lymphadenopathy:     Cervical: No cervical adenopathy.  Skin:    General: Skin is warm and dry.     Coloration: Skin is not pale.     Findings: No rash.  Neurological:     Mental Status: She is alert.     Cranial Nerves: No cranial nerve deficit.      Coordination: Coordination normal.     Deep Tendon Reflexes: Reflexes are normal and symmetric. Reflexes normal.  Psychiatric:        Mood and Affect: Mood normal.           Assessment & Plan:   Problem List Items Addressed This Visit       Cardiovascular and Mediastinum   Hypertension - Primary   Improved with lisinopril hct 10-12.5 daily  .bp in fair control at this time  BP Readings from Last 1 Encounters:  04/17/23 120/65   No changes needed Most recent labs reviewed  Disc lifstyle change with low sodium diet and exercise  Lab today        Respiratory   Viral URI   With sinus symptoms  Discussed symptom control with flonase See AVS   Update if not starting to improve in a week or if worsening  Call back and Er precautions noted in detail today        Sinusitis, chronic   Worse with viral uri  No signs and symptoms of bacterial sinusitis Will start flonase bid then every day  Update if not starting to improve in a week or if worsening  Call back and Er precautions noted in detail today    Reviewed signs and symptoms of bact infx to watch for       Relevant Medications   fluticasone (FLONASE) 50 MCG/ACT nasal spray     Digestive   Diverticulosis   Improved / encouraged to avoid nuts and seeds  Other   RESOLVED: Skipped beats   This resolved with treatment / H2 blocker       Hypokalemia   Lab Results  Component Value Date   K 3.4 (L) 04/03/2023   Prescription klor con 10 meq daily  Will re check this in 1-2 weeks       Relevant Orders   Basic metabolic panel   Epigastric pain   Much improved with pepcid 20 mg bid  Will plan to continue this at least 6-12 months Encouraged to watch diet for triggers       Encounter for screening mammogram for breast cancer   Mammogram ordered  Pt will call to schedule       Relevant Orders   MM 3D SCREENING MAMMOGRAM BILATERAL BREAST   Bloating   Improved with pepcid

## 2023-04-17 NOTE — Assessment & Plan Note (Signed)
Improved with lisinopril hct 10-12.5 daily  .bp in fair control at this time  BP Readings from Last 1 Encounters:  04/17/23 120/65   No changes needed Most recent labs reviewed  Disc lifstyle change with low sodium diet and exercise  Lab today

## 2023-04-17 NOTE — Assessment & Plan Note (Signed)
Mammogram ordered °Pt will call to schedule  °

## 2023-04-17 NOTE — Patient Instructions (Addendum)
I think you have a non bacterial sinusitis right now  A viral cold has your sinuses inflamed  Watch for green/yellow nasal mucous and/or fever - please call   Use flonase 2 sprays in each nostril twice daily for 3 days , then go to once daily   You can use nasal saline spray over the counter for mucous  You can breathe steam and use a vaporizer   Start potassium one pill daily  We will check potassium level in 1-2 weeks   Continue pepcid     You have an order for:  []   2D Mammogram  [x]   3D Mammogram  []   Bone Density     Please call for appointment:   [x]   [facility]  [address] [place] [address]  [phone]  []   [facility])   [address]  [place], [place] [address]  [phone]  []   [facility]      [address] [place], [place]        [phone]         []   [facility]  [address] [place], [place]  [phone]  []  [facility]   [address]   [place], [place] [address]  [phone]  []  [facility]  [address] [place], [place] [address] [phone]    Make sure to wear two piece clothing  No lotions powders or deodorants the day of the appointment Make sure to bring picture ID and insurance card.  Bring list of medications you are currently taking including any supplements.   Schedule your screening mammogram through MyChart!   Select [place] imaging sites can now be scheduled through MyChart.  Log into your MyChart account.  Go to 'Visit' (or 'Appointments' if  on mobile App) --> Schedule an  Appointment  Under 'Select a Reason for Visit' choose the Mammogram  Screening option.  Complete the pre-visit questions  and select the time and place that  best fits your  schedule

## 2023-04-17 NOTE — Assessment & Plan Note (Signed)
With sinus symptoms  Discussed symptom control with flonase See AVS   Update if not starting to improve in a week or if worsening  Call back and Er precautions noted in detail today

## 2023-04-17 NOTE — Assessment & Plan Note (Signed)
Improved with pepcid.

## 2023-04-17 NOTE — Assessment & Plan Note (Signed)
Much improved with pepcid 20 mg bid  Will plan to continue this at least 6-12 months Encouraged to watch diet for triggers

## 2023-04-17 NOTE — Assessment & Plan Note (Signed)
Lab Results  Component Value Date   K 3.4 (L) 04/03/2023   Prescription klor con 10 meq daily  Will re check this in 1-2 weeks

## 2023-04-17 NOTE — Assessment & Plan Note (Signed)
Improved / encouraged to avoid nuts and seeds

## 2023-04-21 ENCOUNTER — Telehealth: Payer: Self-pay | Admitting: Family Medicine

## 2023-04-21 MED ORDER — POTASSIUM CHLORIDE CRYS ER 10 MEQ PO TBCR: 10.0000 meq | EXTENDED_RELEASE_TABLET | Freq: Every day | ORAL | 1 refills | Status: DC

## 2023-04-21 NOTE — Telephone Encounter (Signed)
Copied from CRM [id number]. Topic: Clinical - Medication Question >> Apr 21, 2023  8:35 AM Leavy Cella D wrote: Reason for CRM: Patient has a question about her potassium chloride (KLOR-CON M) 10 MEQ tablet medication .

## 2023-04-21 NOTE — Telephone Encounter (Signed)
It should be once daily  Let her know  I re sent the prescription also

## 2023-04-21 NOTE — Addendum Note (Signed)
Addended by: Roxy Manns A on: 04/21/2023 09:51 AM   Modules accepted: Orders

## 2023-04-21 NOTE — Telephone Encounter (Signed)
 Called patient and reviewed all information. Patient verbalized understanding. Will call if any further questions.

## 2023-04-21 NOTE — Telephone Encounter (Signed)
 Patient called she states that her notes were to take potassium one a day. She noticed her script was for two a day. Advised patient that we will send message to provider and get clarification. She has been taking one a day. She will continue with that until we reach out. I have let her know that it may be later in the week due to holiday.

## 2023-04-27 ENCOUNTER — Telehealth: Payer: Self-pay

## 2023-04-27 NOTE — Telephone Encounter (Signed)
 Per appt notes pt already has appt on 04/28/23 at 2:30 with Dr Milinda Antis. Ending note to Dr Milinda Antis and [employer].

## 2023-04-27 NOTE — Telephone Encounter (Signed)
 Colleen Cochran

## 2023-04-28 ENCOUNTER — Encounter: Payer: Self-pay | Admitting: Family Medicine

## 2023-04-28 ENCOUNTER — Ambulatory Visit (INDEPENDENT_AMBULATORY_CARE_PROVIDER_SITE_OTHER): Payer: Medicare Other | Admitting: Family Medicine

## 2023-04-28 VITALS — BP 122/70 | HR 87 | Temp 98.2°F | Ht 64.5 in | Wt 143.4 lb

## 2023-04-28 DIAGNOSIS — F419 Anxiety disorder, unspecified: Secondary | ICD-10-CM | POA: Insufficient documentation

## 2023-04-28 DIAGNOSIS — I10 Essential (primary) hypertension: Secondary | ICD-10-CM

## 2023-04-28 DIAGNOSIS — E876 Hypokalemia: Secondary | ICD-10-CM | POA: Diagnosis not present

## 2023-04-28 NOTE — Assessment & Plan Note (Signed)
 Pt had elevated blood pressure after a very stressful day on 1/3 with some light headedness  This resolved Blood pressure is back to normal today   BP Readings from Last 1 Encounters:  04/28/23 122/70   No changes needed Will continue lisinopril  hct 10-12.5 mg daily   (with klor con 10 meq daily)  Most recent labs reviewed  Disc lifstyle change with low sodium diet and exercise   Discussed proper way to check blood pressure at home and tested her cuff today which seemed fairly accurate

## 2023-04-28 NOTE — Assessment & Plan Note (Signed)
 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

## 2023-04-28 NOTE — Patient Instructions (Signed)
 Our goal is to keep top number of blood pressure under 140 and bottom under 90   If you are anxious or have just exercised - your blood pressure will be higher   Try and avoid excess sodium and processed foods   Try the new position when you check your blood pressure   If you want to talk to a counselor about stress please let us  know

## 2023-04-28 NOTE — Assessment & Plan Note (Signed)
 Lab Results  Component Value Date   K 3.4 (L) 04/03/2023   On lisinopril hct 10-12.5 (likely cause) Encouraged high K foods  Now taking klor con 10 meq daily   Plan to re check labs in 5-7 days

## 2023-04-28 NOTE — Progress Notes (Signed)
 Subjective:    Patient ID: Colleen Cochran, female    DOB: Apr 03, 1946, 78 y.o.   MRN: [id number]  HPI  Wt Readings from Last 3 Encounters:  04/28/23 143 lb 6 oz (65 kg)  04/17/23 142 lb 2 oz (64.5 kg)  04/03/23 142 lb 6 oz (64.6 kg)   24.23 kg/m  Vitals:   04/28/23 1418  BP: 122/70  Pulse: 87  Temp: 98.2 F (36.8 C)  SpO2: 95%   Pt presents for follow up of HTN  Also anxious mood  Also low potassium    No cp or palpitations or headaches or edema  No side effects to medicines  BP Readings from Last 3 Encounters:  04/28/23 122/70  04/17/23 120/65  04/03/23 132/74    Lisinopril  hct 10-12.5 mg daily   Pulse Readings from Last 3 Encounters:  04/28/23 87  04/17/23 96  04/03/23 87   No new over the counter medicines  Was eating some holiday food  No etoh (drinks lots of water and occational one soda but not often)     On Friday 1/3 had some issues  Got up and felt like blood pressure was off  Felt light headed / just a little dizzy -may have made her more anxious    Went up to highest of 169/106 then started coming down   A lot went on that day  Was anxious (not traumatic but a lot going on and became very worried)   Needs to stop worrying   Did not miss any medicine doses      Lab Results  Component Value Date   NA 141 04/03/2023   K 3.4 (L) 04/03/2023   CO2 32 04/03/2023   GLUCOSE 105 (H) 04/03/2023   BUN 16 04/03/2023   CREATININE 0.68 04/03/2023   CALCIUM 9.4 04/03/2023   GFR 84.69 04/03/2023   GFRNONAA >60 01/05/2016      Patient Active Problem List   Diagnosis Date Noted   Anxious mood 04/28/2023   Hypokalemia 04/17/2023   Epigastric pain 04/03/2023   Bloating 04/03/2023   Diverticulosis 04/03/2023   Restless legs 04/03/2023   Sinus pressure 04/10/2022   Encounter for screening mammogram for breast cancer 09/02/2021   Screening mammogram, encounter for 06/03/2017   Viral URI 01/09/2016   Second hand smoke exposure 01/09/2016    Estrogen deficiency 04/04/2015   Encounter for Medicare annual wellness exam 09/30/2013   Family history of aortic aneurysm 09/30/2013   Routine general medical examination at a health care facility 11/17/2011   Hypertension 09/24/2010   Asymptomatic postmenopausal status 06/24/2010   Vitamin D  deficiency 03/02/2009   History of colon polyps - adenomas 12/17/2007   Hyperlipidemia 10/05/2007   Sinusitis, chronic 10/05/2007   Allergic rhinitis 10/05/2007   Osteoporosis 10/05/2007   NEPHROLITHIASIS, HX OF 10/05/2007   Past Medical History:  Diagnosis Date   Allergy    allergic rhinitis   Arthritis    History of colon polyps - adenomas 12/17/2007   Hyperlipidemia    Hypertension    Kidney stone    Nephrolithiasis    Hx of   Osteopenia    Past Surgical History:  Procedure Laterality Date   ABDOMINAL HYSTERECTOMY  04/21/1978   total for bleeding   CESAREAN SECTION  04/22/1967   COLONOSCOPY     DILATION AND CURETTAGE OF UTERUS     x2   EYE SURGERY  03/11/2021   eye surgey  02/18/2021   Social History   Tobacco  Use   Smoking status: Never    Passive exposure: Yes (husband is a smoker but she doesn't smoke and never has.)   Smokeless tobacco: Never   Tobacco comments:    husband smokes  Vaping Use   Vaping status: Never Used  Substance Use Topics   Alcohol use: No    Alcohol/week: 0.0 standard drinks of alcohol   Drug use: No   Family History  Problem Relation Age of Onset   Hypertension Mother    Osteoporosis Mother    Heart disease Father        CABG,CAD,MI   Kidney failure Father    Osteoporosis Father        ? of OP pt had hip fx.   Hypertension Brother    Colon cancer Neg Hx    Pancreatic cancer Neg Hx    Rectal cancer Neg Hx    Stomach cancer Neg Hx    Breast cancer Neg Hx    No Known Allergies Current Outpatient Medications on File Prior to Visit  Medication Sig Dispense Refill   albuterol  (VENTOLIN  HFA) 108 (90 Base) MCG/ACT inhaler INHALE 1 TO  2 PUFFS BY MOUTH EVERY 4 HOURS (Patient taking differently: Inhale 2 puffs into the lungs every 6 (six) hours as needed. INHALE 1 TO 2 PUFFS BY MOUTH EVERY 4 HOURS) 6.7 g 1   aspirin 81 MG tablet Take 81 mg by mouth daily.     cholecalciferol (VITAMIN D ) 1000 UNITS tablet Take 2,000 Units by mouth daily.     famotidine  (PEPCID ) 20 MG tablet Take 1 tablet (20 mg total) by mouth 2 (two) times daily. 180 tablet 3   fluticasone  (FLONASE ) 50 MCG/ACT nasal spray Place 2 sprays into both nostrils daily. 16 g 6   lisinopril -hydrochlorothiazide  (ZESTORETIC ) 20-12.5 MG tablet Take 1 tablet by mouth daily. 90 tablet 3   potassium chloride  (KLOR-CON  M) 10 MEQ tablet Take 1 tablet (10 mEq total) by mouth daily. 30 tablet 1   simvastatin  (ZOCOR ) 10 MG tablet TAKE 1 TABLET(10 MG) BY MOUTH DAILY 90 tablet 3   No current facility-administered medications on file prior to visit.    Review of Systems  Constitutional:  Negative for activity change, appetite change, fatigue, fever and unexpected weight change.  HENT:  Negative for congestion, ear pain, rhinorrhea, sinus pressure and sore throat.   Eyes:  Negative for pain, redness and visual disturbance.  Respiratory:  Negative for cough, shortness of breath and wheezing.   Cardiovascular:  Negative for chest pain and palpitations.  Gastrointestinal:  Negative for abdominal pain, blood in stool, constipation and diarrhea.  Endocrine: Negative for polydipsia and polyuria.  Genitourinary:  Negative for dysuria, frequency and urgency.  Musculoskeletal:  Negative for arthralgias, back pain and myalgias.  Skin:  Negative for pallor and rash.  Allergic/Immunologic: Negative for environmental allergies.  Neurological:  Negative for dizziness, syncope, light-headedness and headaches.       No longer light headed   Hematological:  Negative for adenopathy. Does not bruise/bleed easily.  Psychiatric/Behavioral:  Negative for decreased concentration and dysphoric mood.  The patient is nervous/anxious.        Objective:   Physical Exam Constitutional:      General: She is not in acute distress.    Appearance: Normal appearance. She is well-developed and normal weight. She is not ill-appearing or diaphoretic.  HENT:     Head: Normocephalic and atraumatic.  Eyes:     Conjunctiva/sclera: Conjunctivae normal.  Pupils: Pupils are equal, round, and reactive to light.  Neck:     Thyroid : No thyromegaly.     Vascular: No carotid bruit or JVD.  Cardiovascular:     Rate and Rhythm: Normal rate and regular rhythm.     Heart sounds: Normal heart sounds.     No gallop.  Pulmonary:     Effort: Pulmonary effort is normal. No respiratory distress.     Breath sounds: Normal breath sounds. No wheezing or rales.  Abdominal:     General: There is no distension or abdominal bruit.     Palpations: Abdomen is soft.  Musculoskeletal:     Cervical back: Normal range of motion and neck supple.     Right lower leg: No edema.     Left lower leg: No edema.  Lymphadenopathy:     Cervical: No cervical adenopathy.  Skin:    General: Skin is warm and dry.     Coloration: Skin is not pale.     Findings: No rash.  Neurological:     Mental Status: She is alert.     Cranial Nerves: No cranial nerve deficit.     Motor: No weakness.     Coordination: Coordination normal.     Gait: Gait normal.     Deep Tendon Reflexes: Reflexes are normal and symmetric. Reflexes normal.  Psychiatric:        Attention and Perception: Attention normal.        Mood and Affect: Mood is anxious.        Speech: Speech normal.        Behavior: Behavior normal.        Thought Content: Thought content normal.        Cognition and Memory: Cognition and memory normal.     Comments: Candidly discusses symptoms and stressors               Assessment & Plan:   Problem List Items Addressed This Visit       Cardiovascular and Mediastinum   Hypertension - Primary   Pt had elevated  blood pressure after a very stressful day on 1/3 with some light headedness  This resolved Blood pressure is back to normal today   BP Readings from Last 1 Encounters:  04/28/23 122/70   No changes needed Will continue lisinopril  hct 10-12.5 mg daily   (with klor con 10 meq daily)  Most recent labs reviewed  Disc lifstyle change with low sodium diet and exercise   Discussed proper way to check blood pressure at home and tested her cuff today which seemed fairly accurate          Other   Hypokalemia   Lab Results  Component Value Date   K 3.4 (L) 04/03/2023   On lisinopril  hct 10-12.5 (likely cause) Encouraged high K foods  Now taking klor con 10 meq daily   Plan to re check labs in 5-7 days       Anxious mood   Pt is a worrier  Had a bad day on 1/3 which caused blood pressure to go up (this made her more anxious) 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

## 2023-04-30 ENCOUNTER — Telehealth: Payer: Self-pay | Admitting: Family Medicine

## 2023-04-30 DIAGNOSIS — E876 Hypokalemia: Secondary | ICD-10-CM

## 2023-04-30 NOTE — Telephone Encounter (Signed)
-----   Message from Vincenza Hews sent at 04/17/2023  1:54 PM EST ----- Regarding: Lab Fri 05/01/23 Hello,   Patient has a lab appointment on Friday 05/01/23. Can we get lab orders please.   Thanks

## 2023-05-01 ENCOUNTER — Other Ambulatory Visit (INDEPENDENT_AMBULATORY_CARE_PROVIDER_SITE_OTHER): Payer: Medicare Other

## 2023-05-01 DIAGNOSIS — E876 Hypokalemia: Secondary | ICD-10-CM | POA: Diagnosis not present

## 2023-05-01 LAB — BASIC METABOLIC PANEL
BUN: 17 mg/dL (ref 6–23)
CO2: 32 meq/L (ref 19–32)
Calcium: 10.1 mg/dL (ref 8.4–10.5)
Chloride: 98 meq/L (ref 96–112)
Creatinine, Ser: 0.7 mg/dL (ref 0.40–1.20)
GFR: 84.05 mL/min (ref >60.00)
Glucose, Bld: 83 mg/dL (ref 70–99)
Potassium: 4.1 meq/L (ref 3.5–5.1)
Sodium: 138 meq/L (ref 135–145)

## 2023-05-04 ENCOUNTER — Encounter: Payer: Self-pay | Admitting: Family Medicine

## 2023-05-04 NOTE — Telephone Encounter (Signed)
 Please tell her to go ahead and continue klor con 10 meq daily  Refill for a year please if needed

## 2023-05-05 MED ORDER — POTASSIUM CHLORIDE CRYS ER 10 MEQ PO TBCR: 10.0000 meq | EXTENDED_RELEASE_TABLET | Freq: Every day | ORAL | 2 refills | Status: DC

## 2023-05-18 ENCOUNTER — Encounter: Payer: Self-pay | Admitting: Family Medicine

## 2023-05-18 ENCOUNTER — Ambulatory Visit (INDEPENDENT_AMBULATORY_CARE_PROVIDER_SITE_OTHER): Payer: Medicare Other | Admitting: Family Medicine

## 2023-05-18 ENCOUNTER — Ambulatory Visit: Payer: Self-pay | Admitting: Family Medicine

## 2023-05-18 VITALS — BP 134/68 | HR 88 | Temp 98.3°F | Ht 64.5 in | Wt 143.0 lb

## 2023-05-18 DIAGNOSIS — J0100 Acute maxillary sinusitis, unspecified: Secondary | ICD-10-CM | POA: Diagnosis not present

## 2023-05-18 DIAGNOSIS — J320 Chronic maxillary sinusitis: Secondary | ICD-10-CM

## 2023-05-18 DIAGNOSIS — J069 Acute upper respiratory infection, unspecified: Secondary | ICD-10-CM | POA: Diagnosis not present

## 2023-05-18 DIAGNOSIS — R1013 Epigastric pain: Secondary | ICD-10-CM

## 2023-05-18 MED ORDER — AMOXICILLIN-POT CLAVULANATE 875-125 MG PO TABS: 1.0000 | ORAL_TABLET | Freq: Two times a day (BID) | ORAL | 0 refills | Status: DC

## 2023-05-18 MED ORDER — BENZONATATE 200 MG PO CAPS: 200.0000 mg | ORAL_CAPSULE | Freq: Three times a day (TID) | ORAL | 1 refills | Status: DC | PRN

## 2023-05-18 NOTE — Assessment & Plan Note (Signed)
Started 6 d ago  Now having symptoms of acute bacterial sinusitis  (will treat) See AVS for symptom care  Flonase= bid for 3 d then daily   Watch for wheezing  Tessalon sent  Update if not starting to improve in a week or if worsening  Call back and Er precautions noted in detail today

## 2023-05-18 NOTE — Assessment & Plan Note (Signed)
Much improvement with pepcid 20 mg bid  Once her sinus infx is better will try to wean   Daily for 1 mo If well- every other day 2 wk If well stop   If symptoms rebound - start back on bid   Avoid triggers in diet

## 2023-05-18 NOTE — Telephone Encounter (Signed)
Copied from CRM [id number]. Topic: Clinical - Red Word Triage >> May 18, 2023  8:10 AM Colleen Cochran wrote: Red Word that prompted transfer to Nurse Triage: Sinuses worse  Chief Complaint: sinus Symptoms: green nose drainage, headache, pain to forehead and teeth, cough Frequency: constant Pertinent Negatives: Patient denies fever, difficulty breathing Disposition: [] ED /[] Urgent Care (no appt availability in office) / [x] Appointment(In office/virtual)/ []  [facility]/ [] Home Care/ [] Refused Recommended Disposition /[] [facility]/ []  Follow-up with PCP Additional Notes: States was seen last week and was not put on antibiotic.  Apt. Made for this am due to worsening symptoms.  Instructed to go to ER if becomes worse.   Reason for Disposition  [1] Sinus congestion (pressure, fullness) AND [2] present > 10 days  Answer Assessment - Initial Assessment Questions 1. LOCATION: "Where does it hurt?"      Above eyes, teeth 2. ONSET: "When did the sinus pain start?"  (e.g., hours, days)      Last Thursday 3. SEVERITY: "How bad is the pain?"   (Scale 1-10; mild, moderate or severe)   - MILD (1-3): doesn't interfere with normal activities    - MODERATE (4-7): interferes with normal activities (e.g., work or school) or awakens from sleep   - SEVERE (8-10): excruciating pain and patient unable to do any normal activities        7/10 4. RECURRENT SYMPTOM: "Have you ever had sinus problems before?" If Yes, ask: "When was the last time?" and "What happened that time?"      yes 5. NASAL CONGESTION: "Is the nose blocked?" If Yes, ask: "Can you open it or must you breathe through your mouth?"     Green discharge from nose 6. NASAL DISCHARGE: "Do you have discharge from your nose?" If so ask, "What color?"     green 7. FEVER: "Do you have a fever?" If Yes, ask: "What is it, how was it measured, and when did it start?"      99.1 8. OTHER SYMPTOMS: "Do you have any other symptoms?" (e.g.,  sore throat, cough, earache, difficulty breathing)     Cough, sore throat.  Protocols used: Sinus Pain or Congestion-A-AH

## 2023-05-18 NOTE — Patient Instructions (Addendum)
Take the augmentin  Tessalon for cough as needed  Drink fluids Breathe steam    If needed -mucinex DM is also good for cough with congestion over the counter   If you have wheezing /chest tightness- use your inhaler     Continue flonase and nasal saline    Once sinus infection is totally better, cut the famotidine (for stomach) to daily  Do that for a month then if doing well, cut to every other day for 2 week  Then if doing well try stopping it

## 2023-05-18 NOTE — Progress Notes (Signed)
Subjective:    Patient ID: Colleen Cochran, female    DOB: [birth year], 78 y.o.   MRN: [id number]  HPI  Wt Readings from Last 3 Encounters:  05/18/23 143 lb (64.9 kg)  04/28/23 143 lb 6 oz (65 kg)  04/17/23 142 lb 2 oz (64.5 kg)   24.17 kg/m  Vitals:   05/18/23 0951  BP: 134/68  Pulse: 88  Temp: 98.3 F (36.8 C)  SpO2: 99%     Pt presents for worsening sinus symptoms (in setting of chronic sinusitis)  Also follow up of dyspepsia/epigastric pain    Had viral uri end of December  Encouraged her to use flonase   Last Wednesday woke up with ST  That got better then cough started along with sinus symptoms   Cough is a little productive  No wheeze or shortness of breath  Highest temp 99.1   Did a covid test-is negative   Green nasal mucous  Facial pain  Dental pain  Headache     Over the counter /symptom Salt water gargle  Flonase Nasal saline spray  Tylenol  Albuterol  Used some tessalon    Patient Active Problem List   Diagnosis Date Noted   Anxious mood 04/28/2023   Hypokalemia 04/17/2023   Dyspepsia 04/03/2023   Bloating 04/03/2023   Diverticulosis 04/03/2023   Restless legs 04/03/2023   Encounter for screening mammogram for breast cancer 09/02/2021   Acute sinusitis 08/30/2020   Screening mammogram, encounter for 06/03/2017   Viral URI 01/09/2016   Second hand smoke exposure 01/09/2016   Estrogen deficiency 04/04/2015   Encounter for Medicare annual wellness exam 09/30/2013   Family history of aortic aneurysm 09/30/2013   Routine general medical examination at a health care facility 11/17/2011   Hypertension 09/24/2010   Asymptomatic postmenopausal status 06/24/2010   Vitamin D deficiency 03/02/2009   History of colon polyps - adenomas 12/17/2007   Hyperlipidemia 10/05/2007   Sinusitis, chronic 10/05/2007   Allergic rhinitis 10/05/2007   Osteoporosis 10/05/2007   NEPHROLITHIASIS, HX OF 10/05/2007   Past Medical History:  Diagnosis  Date   Allergy    allergic rhinitis   Arthritis    History of colon polyps - adenomas 12/17/2007   Hyperlipidemia    Hypertension    Kidney stone    Nephrolithiasis    Hx of   Osteopenia    Past Surgical History:  Procedure Laterality Date   ABDOMINAL HYSTERECTOMY  04/21/1978   total for bleeding   CESAREAN SECTION  04/22/1967   COLONOSCOPY     DILATION AND CURETTAGE OF UTERUS     x2   EYE SURGERY  03/11/2021   eye surgey  02/18/2021   Social History   Tobacco Use   Smoking status: Never    Passive exposure: Yes (husband is a smoker but she doesn't smoke and never has.)   Smokeless tobacco: Never   Tobacco comments:    husband smokes  Vaping Use   Vaping status: Never Used  Substance Use Topics   Alcohol use: No    Alcohol/week: 0.0 standard drinks of alcohol   Drug use: No   Family History  Problem Relation Age of Onset   Hypertension Mother    Osteoporosis Mother    Heart disease Father        CABG,CAD,MI   Kidney failure Father    Osteoporosis Father        ? of OP pt had hip fx.   Hypertension Brother  Colon cancer Neg Hx    Pancreatic cancer Neg Hx    Rectal cancer Neg Hx    Stomach cancer Neg Hx    Breast cancer Neg Hx    No Known Allergies Current Outpatient Medications on File Prior to Visit  Medication Sig Dispense Refill   albuterol (VENTOLIN HFA) 108 (90 Base) MCG/ACT inhaler INHALE 1 TO 2 PUFFS BY MOUTH EVERY 4 HOURS (Patient taking differently: Inhale 2 puffs into the lungs every 6 (six) hours as needed. INHALE 1 TO 2 PUFFS BY MOUTH EVERY 4 HOURS) 6.7 g 1   aspirin 81 MG tablet Take 81 mg by mouth daily.     cholecalciferol (VITAMIN D) 1000 UNITS tablet Take 2,000 Units by mouth daily.     famotidine (PEPCID) 20 MG tablet Take 1 tablet (20 mg total) by mouth 2 (two) times daily. 180 tablet 3   fluticasone (FLONASE) 50 MCG/ACT nasal spray Place 2 sprays into both nostrils daily. 16 g 6   lisinopril-hydrochlorothiazide (ZESTORETIC) 20-12.5 MG  tablet Take 1 tablet by mouth daily. 90 tablet 3   potassium chloride (KLOR-CON M) 10 MEQ tablet Take 1 tablet (10 mEq total) by mouth daily. 90 tablet 2   simvastatin (ZOCOR) 10 MG tablet TAKE 1 TABLET(10 MG) BY MOUTH DAILY 90 tablet 3   No current facility-administered medications on file prior to visit.    Review of Systems  Constitutional:  Negative for appetite change, fatigue and fever.  HENT:  Positive for congestion, ear pain, postnasal drip, rhinorrhea, sinus pressure and sore throat. Negative for nosebleeds.   Eyes:  Negative for pain, redness and itching.  Respiratory:  Positive for cough. Negative for shortness of breath and wheezing.   Cardiovascular:  Negative for chest pain.  Gastrointestinal:  Negative for abdominal pain, diarrhea, nausea and vomiting.  Endocrine: Negative for polyuria.  Genitourinary:  Negative for dysuria, frequency and urgency.  Musculoskeletal:  Negative for arthralgias and myalgias.  Allergic/Immunologic: Negative for immunocompromised state.  Neurological:  Positive for headaches. Negative for dizziness, tremors, syncope, weakness and numbness.  Hematological:  Negative for adenopathy. Does not bruise/bleed easily.  Psychiatric/Behavioral:  Negative for dysphoric mood. The patient is not nervous/anxious.        Objective:   Physical Exam Constitutional:      General: She is not in acute distress.    Appearance: Normal appearance. She is well-developed and normal weight. She is not ill-appearing.  HENT:     Head: Normocephalic and atraumatic.     Comments: Bilateral maxillary and frontal sinus tenderness    Right Ear: Tympanic membrane and external ear normal.     Left Ear: Tympanic membrane and external ear normal.     Nose: Congestion and rhinorrhea present.     Mouth/Throat:     Pharynx: Oropharynx is clear. No oropharyngeal exudate or posterior oropharyngeal erythema.     Comments: Clear pnd Eyes:     General:        Right eye: No  discharge.        Left eye: No discharge.     Conjunctiva/sclera: Conjunctivae normal.     Pupils: Pupils are equal, round, and reactive to light.  Cardiovascular:     Rate and Rhythm: Normal rate and regular rhythm.  Pulmonary:     Effort: Pulmonary effort is normal. No respiratory distress.     Breath sounds: Normal breath sounds. No stridor. No wheezing, rhonchi or rales.     Comments: Good air exch No rales  or rhonchi Abdominal:     General: Abdomen is flat. There is no distension.  Musculoskeletal:     Cervical back: Normal range of motion and neck supple.  Lymphadenopathy:     Cervical: No cervical adenopathy.  Skin:    General: Skin is warm and dry.     Findings: No rash.  Neurological:     Mental Status: She is alert.     Cranial Nerves: No cranial nerve deficit.     Coordination: Coordination normal.  Psychiatric:        Mood and Affect: Mood normal.           Assessment & Plan:   Problem List Items Addressed This Visit       Respiratory   Viral URI   Started 6 d ago  Now having symptoms of acute bacterial sinusitis  (will treat) See AVS for symptom care  Flonase= bid for 3 d then daily   Watch for wheezing  Tessalon sent  Update if not starting to improve in a week or if worsening  Call back and Er precautions noted in detail today        Sinusitis, chronic   Continues flonase  Signs of acute bacterial infx today Will treatment with augmentin  Call back and Er precautions noted in detail today        Relevant Medications   benzonatate (TESSALON) 200 MG capsule   amoxicillin-clavulanate (AUGMENTIN) 875-125 MG tablet   Acute sinusitis - Primary   With viral uri in pt with chronic sinusitis  Now purulent nasal discharge and facial pain  Tenderness on exam  See AVS for symptom care   Prescription augmentin bid 7d Tessalon for cough   Update if not starting to improve in a week or if worsening  Call back and Er precautions noted in detail  today        Relevant Medications   benzonatate (TESSALON) 200 MG capsule   amoxicillin-clavulanate (AUGMENTIN) 875-125 MG tablet     Other   Dyspepsia   Much improvement with pepcid 20 mg bid  Once her sinus infx is better will try to wean   Daily for 1 mo If well- every other day 2 wk If well stop   If symptoms rebound - start back on bid   Avoid triggers in diet

## 2023-05-18 NOTE — Telephone Encounter (Signed)
Will see her as planned

## 2023-05-18 NOTE — Assessment & Plan Note (Signed)
With viral uri in pt with chronic sinusitis  Now purulent nasal discharge and facial pain  Tenderness on exam  See AVS for symptom care   Prescription augmentin bid 7d Tessalon for cough   Update if not starting to improve in a week or if worsening  Call back and Er precautions noted in detail today

## 2023-05-18 NOTE — Assessment & Plan Note (Signed)
Continues flonase  Signs of acute bacterial infx today Will treatment with augmentin  Call back and Er precautions noted in detail today

## 2023-08-31 ENCOUNTER — Ambulatory Visit
Admission: RE | Admit: 2023-08-31 | Discharge: 2023-08-31 | Disposition: A | Discharge status: Home / Self Care | Source: Ambulatory Visit | Attending: Family Medicine | Admitting: Family Medicine

## 2023-08-31 ENCOUNTER — Encounter: Payer: Medicare Other | Admitting: Family Medicine

## 2023-08-31 DIAGNOSIS — Z1231 Encounter for screening mammogram for malignant neoplasm of breast: Secondary | ICD-10-CM | POA: Insufficient documentation

## 2023-09-02 ENCOUNTER — Ambulatory Visit: Payer: Self-pay | Admitting: Family Medicine

## 2023-09-07 ENCOUNTER — Ambulatory Visit (INDEPENDENT_AMBULATORY_CARE_PROVIDER_SITE_OTHER): Payer: Medicare Other

## 2023-09-07 VITALS — BP 134/68 | Ht 64.5 in | Wt 135.0 lb

## 2023-09-07 DIAGNOSIS — Z2821 Immunization not carried out because of patient refusal: Secondary | ICD-10-CM

## 2023-09-07 DIAGNOSIS — Z0000 Encounter for general adult medical examination without abnormal findings: Secondary | ICD-10-CM | POA: Diagnosis not present

## 2023-09-07 NOTE — Progress Notes (Signed)
 Because this visit was a virtual/telehealth visit,  certain criteria was not obtained, such a blood pressure, CBG if applicable, and timed get up and go. Any medications not marked as "taking" were not mentioned during the medication reconciliation part of the visit. Any vitals not documented were not able to be obtained due to this being a telehealth visit or patient was unable to self-report a recent blood pressure reading due to a lack of equipment at home via telehealth. Vitals that have been documented are verbally provided by the patient.   This visit was performed by a medical professional under my direct supervision. I was immediately available for consultation/collaboration. I have reviewed and agree with the Annual Wellness Visit documentation.  Subjective:   Colleen Cochran is a 78 y.o. who presents for a Medicare Wellness preventive visit.  As a reminder, Annual Wellness Visits don't include a physical exam, and some assessments may be limited, especially if this visit is performed virtually. We may recommend an in-person follow-up visit with your provider if needed.  Visit Complete: Virtual I connected with  Colleen Cochran on 09/07/23 by a audio enabled telemedicine application and verified that I am speaking with the correct person using two identifiers.  Patient Location: Home  Provider Location: Home Office  I discussed the limitations of evaluation and management by telemedicine. The patient expressed understanding and agreed to proceed.  Vital Signs: Because this visit was a virtual/telehealth visit, some criteria may be missing or patient reported. Any vitals not documented were not able to be obtained and vitals that have been documented are patient reported.  VideoDeclined- This patient declined Librarian, academic. Therefore the visit was completed with audio only.  Persons Participating in Visit: Patient.  AWV Questionnaire: No: Patient  Medicare AWV questionnaire was not completed prior to this visit.  Cardiac Risk Factors include: advanced age (>29men, >10 women);dyslipidemia;hypertension     Objective:     Today's Vitals   09/07/23 1338  BP: 134/68  Weight: 135 lb (61.2 kg)  Height: 5' 4.5" (1.638 m)   Body mass index is 22.81 kg/m.     09/07/2023    1:35 PM 08/28/2022    8:25 AM 08/26/2021    8:57 AM 06/13/2019   10:32 AM 06/08/2018    4:26 PM 05/28/2017    9:52 AM 05/27/2016    1:25 PM  Advanced Directives  Does Patient Have a Medical Advance Directive? Yes No No No No No No  Type of Estate agent of [place];Living will        Copy of Healthcare Power of Attorney in Chart? No - copy requested        Would patient like information on creating a medical advance directive?  No - Patient declined No - Patient declined No - Patient declined No - Patient declined Yes (MAU/Ambulatory/Procedural Areas - Information given)     Current Medications (verified) Outpatient Encounter Medications as of 09/07/2023  Medication Sig   albuterol  (VENTOLIN  HFA) 108 (90 Base) MCG/ACT inhaler INHALE 1 TO 2 PUFFS BY MOUTH EVERY 4 HOURS (Patient taking differently: Inhale 2 puffs into the lungs every 6 (six) hours as needed. INHALE 1 TO 2 PUFFS BY MOUTH EVERY 4 HOURS)   aspirin 81 MG tablet Take 81 mg by mouth daily.   cholecalciferol (VITAMIN D ) 1000 UNITS tablet Take 2,000 Units by mouth daily.   famotidine  (PEPCID ) 20 MG tablet Take 1 tablet (20 mg total) by mouth 2 (two)  times daily.   fluticasone  (FLONASE ) 50 MCG/ACT nasal spray Place 2 sprays into both nostrils daily.   lisinopril -hydrochlorothiazide  (ZESTORETIC ) 20-12.5 MG tablet Take 1 tablet by mouth daily.   potassium chloride  (KLOR-CON  M) 10 MEQ tablet Take 1 tablet (10 mEq total) by mouth daily.   simvastatin  (ZOCOR ) 10 MG tablet TAKE 1 TABLET(10 MG) BY MOUTH DAILY   amoxicillin -clavulanate (AUGMENTIN ) 875-125 MG tablet Take 1 tablet by mouth 2 (two)  times daily. (Patient not taking: Reported on 09/07/2023)   benzonatate  (TESSALON ) 200 MG capsule Take 1 capsule (200 mg total) by mouth 3 (three) times daily as needed for cough. Swallow whole (Patient not taking: Reported on 09/07/2023)   No facility-administered encounter medications on file as of 09/07/2023.    Allergies (verified) Patient has no known allergies.   History: Past Medical History:  Diagnosis Date   Allergy    allergic rhinitis   Arthritis    History of colon polyps - adenomas 12/17/2007   Hyperlipidemia    Hypertension    Kidney stone    Nephrolithiasis    Hx of   Osteopenia    Past Surgical History:  Procedure Laterality Date   ABDOMINAL HYSTERECTOMY  04/21/1978   total for bleeding   CESAREAN SECTION  04/22/1967   COLONOSCOPY     DILATION AND CURETTAGE OF UTERUS     x2   EYE SURGERY  03/11/2021   eye surgey  02/18/2021   Family History  Problem Relation Age of Onset   Hypertension Mother    Osteoporosis Mother    Heart disease Father        CABG,CAD,MI   Kidney failure Father    Osteoporosis Father        ? of OP pt had hip fx.   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  Tobacco Use   Smoking status: Never    Passive exposure: Yes (husband is a smoker but she doesn't smoke and never has.)   Smokeless tobacco: Never   Tobacco comments:    husband smokes  Vaping Use   Vaping status: Never Used  Substance and Sexual Activity   Alcohol use: No    Alcohol/week: 0.0 standard drinks of alcohol   Drug  use: No   Sexual activity: Never  Other Topics Concern   Not on file  Social History Narrative   7 grandchildren.   2 great grandchildren.   Married x 54 years in 2023.   Social Drivers of Corporate investment banker Strain: Low Risk  (09/07/2023)   Overall Financial Resource Strain (CARDIA)    Difficulty of Paying Living Expenses: Not hard at all  Food Insecurity: No Food Insecurity (09/07/2023)   Hunger Vital Sign    Worried About Running Out of Food in the Last Year: Never true    Ran Out of Food in the Last Year: Never true  Transportation Needs: No Transportation Needs (09/07/2023)  PRAPARE - Administrator, Civil Service (Medical): No    Lack of Transportation (Non-Medical): No  Physical Activity: Insufficiently Active (09/07/2023)   Exercise Vital Sign    Days of Exercise per Week: 2 days    Minutes of Exercise per Session: 30 min  Stress: No Stress Concern Present (09/07/2023)   [employer] of Occupational Health - Occupational Stress Questionnaire    Feeling of Stress : Not at all  Social Connections: Moderately Integrated (09/07/2023)   Social Connection and Isolation Panel [NHANES]    Frequency of Communication with Friends and Family: More than three times a week    Frequency of Social Gatherings with Friends and Family: More than three times a week    Attends Religious Services: More than 4 times per year    Active Member of [employer] or Organizations: No    Attends Engineer, structural: Never    Marital Status: Married    Tobacco Counseling Counseling given: Not Answered Tobacco comments: husband smokes    Clinical Intake:  Pre-visit preparation completed: Yes  Pain : No/denies pain     BMI - recorded: 22.81 Nutritional Status: BMI of 19-24  Normal Nutritional Risks: None Diabetes: No  No results found for: "HGBA1C"   How often do you need to have someone help you when you read instructions, pamphlets, or other written  materials from your doctor or pharmacy?: 1 - Never  Interpreter Needed?: No  Information entered by :: [employer]   Activities of Daily Living     09/07/2023    1:44 PM  In your present state of health, do you have any difficulty performing the following activities:  Hearing? 0  Vision? 0  Difficulty concentrating or making decisions? 0  Walking or climbing stairs? 0  Dressing or bathing? 0  Doing errands, shopping? 0  Preparing Food and eating ? N  Using the Toilet? N  In the past six months, have you accidently leaked urine? N  Do you have problems with loss of bowel control? N  Managing your Medications? N  Managing your Finances? N  Housekeeping or managing your Housekeeping? N    Patient Care Team: Tower, Manley Seeds, MD as PCP - General Todd Greeson, DDS as Consulting Physician (Dentistry) Isenstein, Arin L, MD as Consulting Physician (Dermatology) Bary Boss., MD as Consulting Physician (Ophthalmology)  Indicate any recent Medical Services you may have received from other than [facility] providers in the past year (date may be approximate).     Assessment:    This is a routine wellness examination for [place].  Hearing/Vision screen Hearing Screening - Comments:: No hearing difficulties  Vision Screening - Comments:: Wears glasses    Goals Addressed             This Visit's Progress    Patient Stated       Get rid of stuff she doesn't need in her house        Depression Screen     09/07/2023    1:45 PM 08/28/2022    8:29 AM 08/28/2022    8:19 AM 08/26/2021    8:51 AM 08/23/2020    8:58 AM 06/13/2019   10:34 AM 06/08/2018    8:11 AM  PHQ 2/9 Scores  PHQ - 2 Score 0 0 0 0 0 0 0  PHQ- 9 Score 1     0 0    Fall Risk     09/07/2023    1:43 PM  04/03/2023    9:21 AM 08/28/2022    8:26 AM 08/26/2021    8:58 AM 08/23/2020    8:57 AM  Fall Risk   Falls in the past year? 0 0 0 0   Number falls in past yr: 0 0 0 0 0  Injury with Fall? 0 0 0 0   Risk for  fall due to : No Fall Risks No Fall Risks No Fall Risks No Fall Risks   Follow up Falls prevention discussed;Falls evaluation completed Falls evaluation completed Falls prevention discussed Falls prevention discussed Falls evaluation completed    MEDICARE RISK AT HOME:  Medicare Risk at Home Any stairs in or around the home?: No If so, are there any without handrails?: No Home free of loose throw rugs in walkways, pet beds, electrical cords, etc?: Yes Adequate lighting in your home to reduce risk of falls?: Yes Life alert?: No Use of a cane, walker or w/c?: No Grab bars in the bathroom?: Yes Shower chair or bench in shower?: No Elevated toilet seat or a handicapped toilet?: Yes  TIMED UP AND GO:  Was the test performed?  No  Cognitive Function: 6CIT completed    06/13/2019   10:36 AM 06/08/2018    8:10 AM 05/28/2017    9:42 AM 05/27/2016    1:05 PM  MMSE - Mini Mental State Exam  Orientation to time 5 5 5 5   Orientation to Place 5 5 5 5   Registration 3 3 3 3   Attention/ Calculation 5 0 0 0  Recall 3 3 3 3   Language- name 2 objects  0 0 0  Language- repeat 1 1 1 1   Language- follow 3 step command  3 3 3   Language- read & follow direction  0 0 0  Write a sentence  0 0 0  Copy design  0 0 0  Total score  20 20 20         09/07/2023    1:42 PM 08/28/2022    8:30 AM 08/26/2021    9:00 AM  6CIT Screen  What Year? 0 points 0 points 0 points  What month? 0 points 0 points 0 points  What time? 0 points 0 points 0 points  Count back from 20 0 points 0 points 0 points  Months in reverse 0 points 0 points 0 points  Repeat phrase 0 points 0 points 0 points  Total Score 0 points 0 points 0 points    Immunizations Immunization History  Administered Date(s) Administered   Fluad Quad(high Dose 65+) 01/21/2019   Influenza,inj,Quad PF,6+ Mos 01/09/2014, 04/04/2015, 01/09/2016, 02/10/2017, 03/11/2018   PFIZER(Purple Top)SARS-COV-2 Vaccination 06/12/2019, 07/06/2019   Pneumococcal  Conjugate-13 04/04/2015   Pneumococcal Polysaccharide-23 09/30/2013   Td 10/06/2007   Varicella Zoster Immune Globulin 09/24/2010   Zoster, Live 11/24/2010    Screening Tests Health Maintenance  Topic Date Due   Zoster Vaccines- Shingrix (1 of 2) [birth year]24/1964   DTaP/Tdap/Td (2 - Tdap) 04/02/2024 (Originally 10/05/2017)   COVID-19 Vaccine (4 - 2024-25 season) 04/18/2025 (Originally 12/21/2022)   INFLUENZA VACCINE  11/20/2023   MAMMOGRAM  08/30/2024   Medicare Annual Wellness (AWV)  09/06/2024   Pneumonia Vaccine 26+ Years old  Completed   DEXA SCAN  Completed   Hepatitis C Screening  Completed   HPV VACCINES  Aged Out   Meningococcal B Vaccine  Aged Out   Fecal DNA (Cologuard)  Discontinued    Health Maintenance  Health Maintenance Due  Topic Date Due  Zoster Vaccines- Shingrix (1 of 2) 12[birth year]24/1964   Health Maintenance Items Addressed:declined shingles vaccine  Additional Screening:  Vision Screening: Recommended annual ophthalmology exams for early detection of glaucoma and other disorders of the eye.  Dental Screening: Recommended annual dental exams for proper oral hygiene  Community Resource Referral / Chronic Care Management: CRR required this visit?  No   CCM required this visit?  No   Plan:    I have personally reviewed and noted the following in the patient's chart:   Medical and social history Use of alcohol, tobacco or illicit drugs  Current medications and supplements including opioid prescriptions. Patient is not currently taking opioid prescriptions. Functional ability and status Nutritional status Physical activity Advanced directives List of other physicians Hospitalizations, surgeries, and ER visits in previous 12 months Vitals Screenings to include cognitive, depression, and falls Referrals and appointments  In addition, I have reviewed and discussed with patient certain preventive protocols, quality metrics, and best practice recommendations.  A written personalized care plan for preventive services as well as general preventive health recommendations were provided to patient.   Freeda Jerry, [place]   09/07/2023   After Visit Summary: (MyChart) Due to this being a telephonic visit, the after visit summary with patients personalized plan was offered to patient via MyChart   Notes: Nothing significant to report at this time.

## 2023-09-07 NOTE — Patient Instructions (Signed)
 Colleen Cochran , Thank you for taking time out of your busy schedule to complete your Annual Wellness Visit with me. I enjoyed our conversation and look forward to speaking with you again next year. I, as well as your care team,  appreciate your ongoing commitment to your health goals. Please review the following plan we discussed and let me know if I can assist you in the future. Your Game plan/ To Do List    Referrals: If you haven't heard from the office you've been referred to, please reach out to them at the phone provided.   Follow up Visits: Next Medicare AWV with our clinical staff: 09/08/2024   Have you seen your provider in the last 6 months (3 months if uncontrolled diabetes)? No Next Office Visit with your provider:   Clinician Recommendations:  Aim for 30 minutes of exercise or brisk walking, 6-8 glasses of water, and 5 servings of fruits and vegetables each day.       This is a list of the screening recommended for you and due dates:  Health Maintenance  Topic Date Due   Zoster (Shingles) Vaccine (1 of 2) 12/24/1964   DTaP/Tdap/Td vaccine (2 - Tdap) 04/02/2024*   COVID-19 Vaccine (4 - 2024-25 season) 04/18/2025*   Flu Shot  11/20/2023   Mammogram  08/30/2024   Medicare Annual Wellness Visit  09/06/2024   Pneumonia Vaccine  Completed   DEXA scan (bone density measurement)  Completed   Hepatitis C Screening  Completed   HPV Vaccine  Aged Out   Meningitis B Vaccine  Aged Out   Cologuard (Stool DNA test)  Discontinued  *Topic was postponed. The date shown is not the original due date.    Advanced directives: (Declined) Advance directive discussed with you today. Even though you declined this today, please call our office should you change your mind, and we can give you the proper paperwork for you to fill out. Advance Care Planning is important because it:  [x]  Makes sure you receive the medical care that is consistent with your values, goals, and preferences  [x]  It  provides guidance to your family and loved ones and reduces their decisional burden about whether or not they are making the right decisions based on your wishes.  Follow the link provided in your after visit summary or read over the paperwork we have mailed to you to help you started getting your Advance Directives in place. If you need assistance in completing these, please reach out to us  so that we can help you!  See attachments for Preventive Care and Fall Prevention Tips.

## 2023-09-07 NOTE — Progress Notes (Deleted)
 Because this visit was a virtual/telehealth visit,  certain criteria was not obtained, such a blood pressure, CBG if applicable, and timed get up and go. Any medications not marked as "taking" were not mentioned during the medication reconciliation part of the visit. Any vitals not documented were not able to be obtained due to this being a telehealth visit or patient was unable to self-report a recent blood pressure reading due to a lack of equipment at home via telehealth. Vitals that have been documented are verbally provided by the patient.   This visit was performed by a medical professional under my direct supervision. I was immediately available for consultation/collaboration. I have reviewed and agree with the Annual Wellness Visit documentation.  Subjective:   Colleen Cochran is a 78 y.o. who presents for a Medicare Wellness preventive visit.  As a reminder, Annual Wellness Visits don't include a physical exam, and some assessments may be limited, especially if this visit is performed virtually. We may recommend an in-person follow-up visit with your provider if needed.  Visit Complete: Virtual I connected with  Pattie Borders on 09/07/23 by a audio enabled telemedicine application and verified that I am speaking with the correct person using two identifiers.  Patient Location: Home  Provider Location: Home Office  I discussed the limitations of evaluation and management by telemedicine. The patient expressed understanding and agreed to proceed.  Vital Signs: Because this visit was a virtual/telehealth visit, some criteria may be missing or patient reported. Any vitals not documented were not able to be obtained and vitals that have been documented are patient reported.  VideoDeclined- This patient declined Librarian, academic. Therefore the visit was completed with audio only.  Persons Participating in Visit: Patient.  AWV Questionnaire: No: Patient  Medicare AWV questionnaire was not completed prior to this visit.  Cardiac Risk Factors include: advanced age (>29men, >10 women);dyslipidemia;hypertension     Objective:     Today's Vitals   09/07/23 1338  BP: 134/68  Weight: 135 lb (61.2 kg)  Height: 5' 4.5" (1.638 m)   Body mass index is 22.81 kg/m.     09/07/2023    1:35 PM 08/28/2022    8:25 AM 08/26/2021    8:57 AM 06/13/2019   10:32 AM 06/08/2018    4:26 PM 05/28/2017    9:52 AM 05/27/2016    1:25 PM  Advanced Directives  Does Patient Have a Medical Advance Directive? Yes No No No No No No  Type of Estate agent of [place];Living will        Copy of Healthcare Power of Attorney in Chart? No - copy requested        Would patient like information on creating a medical advance directive?  No - Patient declined No - Patient declined No - Patient declined No - Patient declined Yes (MAU/Ambulatory/Procedural Areas - Information given)     Current Medications (verified) Outpatient Encounter Medications as of 09/07/2023  Medication Sig   albuterol  (VENTOLIN  HFA) 108 (90 Base) MCG/ACT inhaler INHALE 1 TO 2 PUFFS BY MOUTH EVERY 4 HOURS (Patient taking differently: Inhale 2 puffs into the lungs every 6 (six) hours as needed. INHALE 1 TO 2 PUFFS BY MOUTH EVERY 4 HOURS)   aspirin 81 MG tablet Take 81 mg by mouth daily.   cholecalciferol (VITAMIN D ) 1000 UNITS tablet Take 2,000 Units by mouth daily.   famotidine  (PEPCID ) 20 MG tablet Take 1 tablet (20 mg total) by mouth 2 (two)  times daily.   fluticasone  (FLONASE ) 50 MCG/ACT nasal spray Place 2 sprays into both nostrils daily.   lisinopril -hydrochlorothiazide  (ZESTORETIC ) 20-12.5 MG tablet Take 1 tablet by mouth daily.   potassium chloride  (KLOR-CON  M) 10 MEQ tablet Take 1 tablet (10 mEq total) by mouth daily.   simvastatin  (ZOCOR ) 10 MG tablet TAKE 1 TABLET(10 MG) BY MOUTH DAILY   amoxicillin -clavulanate (AUGMENTIN ) 875-125 MG tablet Take 1 tablet by mouth 2 (two)  times daily. (Patient not taking: Reported on 09/07/2023)   benzonatate  (TESSALON ) 200 MG capsule Take 1 capsule (200 mg total) by mouth 3 (three) times daily as needed for cough. Swallow whole (Patient not taking: Reported on 09/07/2023)   No facility-administered encounter medications on file as of 09/07/2023.    Allergies (verified) Patient has no known allergies.   History: Past Medical History:  Diagnosis Date   Allergy    allergic rhinitis   Arthritis    History of colon polyps - adenomas 12/17/2007   Hyperlipidemia    Hypertension    Kidney stone    Nephrolithiasis    Hx of   Osteopenia    Past Surgical History:  Procedure Laterality Date   ABDOMINAL HYSTERECTOMY  04/21/1978   total for bleeding   CESAREAN SECTION  04/22/1967   COLONOSCOPY     DILATION AND CURETTAGE OF UTERUS     x2   EYE SURGERY  03/11/2021   eye surgey  02/18/2021   Family History  Problem Relation Age of Onset   Hypertension Mother    Osteoporosis Mother    Heart disease Father        CABG,CAD,MI   Kidney failure Father    Osteoporosis Father        ? of OP pt had hip fx.   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  Tobacco Use   Smoking status: Never    Passive exposure: Yes (husband is a smoker but she doesn't smoke and never has.)   Smokeless tobacco: Never   Tobacco comments:    husband smokes  Vaping Use   Vaping status: Never Used  Substance and Sexual Activity   Alcohol use: No    Alcohol/week: 0.0 standard drinks of alcohol   Drug  use: No   Sexual activity: Never  Other Topics Concern   Not on file  Social History Narrative   7 grandchildren.   2 great grandchildren.   Married x 54 years in 2023.   Social Drivers of Corporate investment banker Strain: Low Risk  (09/07/2023)   Overall Financial Resource Strain (CARDIA)    Difficulty of Paying Living Expenses: Not hard at all  Food Insecurity: No Food Insecurity (09/07/2023)   Hunger Vital Sign    Worried About Running Out of Food in the Last Year: Never true    Ran Out of Food in the Last Year: Never true  Transportation Needs: No Transportation Needs (09/07/2023)  PRAPARE - Administrator, Civil Service (Medical): No    Lack of Transportation (Non-Medical): No  Physical Activity: Insufficiently Active (09/07/2023)   Exercise Vital Sign    Days of Exercise per Week: 2 days    Minutes of Exercise per Session: 30 min  Stress: No Stress Concern Present (09/07/2023)   [employer] of Occupational Health - Occupational Stress Questionnaire    Feeling of Stress : Not at all  Social Connections: Moderately Integrated (09/07/2023)   Social Connection and Isolation Panel [NHANES]    Frequency of Communication with Friends and Family: More than three times a week    Frequency of Social Gatherings with Friends and Family: More than three times a week    Attends Religious Services: More than 4 times per year    Active Member of [employer] or Organizations: No    Attends Engineer, structural: Never    Marital Status: Married    Tobacco Counseling Counseling given: Not Answered Tobacco comments: husband smokes    Clinical Intake:  Pre-visit preparation completed: Yes  Pain : No/denies pain     BMI - recorded: 22.81 Nutritional Status: BMI of 19-24  Normal Nutritional Risks: None Diabetes: No  No results found for: "HGBA1C"   How often do you need to have someone help you when you read instructions, pamphlets, or other written  materials from your doctor or pharmacy?: 1 - Never  Interpreter Needed?: No  Information entered by :: [employer]   Activities of Daily Living     09/07/2023    1:44 PM  In your present state of health, do you have any difficulty performing the following activities:  Hearing? 0  Vision? 0  Difficulty concentrating or making decisions? 0  Walking or climbing stairs? 0  Dressing or bathing? 0  Doing errands, shopping? 0  Preparing Food and eating ? N  Using the Toilet? N  In the past six months, have you accidently leaked urine? N  Do you have problems with loss of bowel control? N  Managing your Medications? N  Managing your Finances? N  Housekeeping or managing your Housekeeping? N    Patient Care Team: Tower, Manley Seeds, MD as PCP - General Todd Greeson, DDS as Consulting Physician (Dentistry) Isenstein, Arin L, MD as Consulting Physician (Dermatology) Bary Boss., MD as Consulting Physician (Ophthalmology)  Indicate any recent Medical Services you may have received from other than [facility] providers in the past year (date may be approximate).     Assessment:    This is a routine wellness examination for [place].  Hearing/Vision screen Hearing Screening - Comments:: No hearing difficulties  Vision Screening - Comments:: Wears glasses    Goals Addressed             This Visit's Progress    Patient Stated       Get rid of stuff she doesn't need in her house        Depression Screen     09/07/2023    1:45 PM 08/28/2022    8:29 AM 08/28/2022    8:19 AM 08/26/2021    8:51 AM 08/23/2020    8:58 AM 06/13/2019   10:34 AM 06/08/2018    8:11 AM  PHQ 2/9 Scores  PHQ - 2 Score 0 0 0 0 0 0 0  PHQ- 9 Score 1     0 0    Fall Risk     09/07/2023    1:43 PM  04/03/2023    9:21 AM 08/28/2022    8:26 AM 08/26/2021    8:58 AM 08/23/2020    8:57 AM  Fall Risk   Falls in the past year? 0 0 0 0   Number falls in past yr: 0 0 0 0 0  Injury with Fall? 0 0 0 0   Risk for  fall due to : No Fall Risks No Fall Risks No Fall Risks No Fall Risks   Follow up Falls prevention discussed;Falls evaluation completed Falls evaluation completed Falls prevention discussed Falls prevention discussed Falls evaluation completed    MEDICARE RISK AT HOME:  Medicare Risk at Home Any stairs in or around the home?: No If so, are there any without handrails?: No Home free of loose throw rugs in walkways, pet beds, electrical cords, etc?: Yes Adequate lighting in your home to reduce risk of falls?: Yes Life alert?: No Use of a cane, walker or w/c?: No Grab bars in the bathroom?: Yes Shower chair or bench in shower?: No Elevated toilet seat or a handicapped toilet?: Yes  TIMED UP AND GO:  Was the test performed?  No  Cognitive Function: 6CIT completed    06/13/2019   10:36 AM 06/08/2018    8:10 AM 05/28/2017    9:42 AM 05/27/2016    1:05 PM  MMSE - Mini Mental State Exam  Orientation to time 5 5 5 5   Orientation to Place 5 5 5 5   Registration 3 3 3 3   Attention/ Calculation 5 0 0 0  Recall 3 3 3 3   Language- name 2 objects  0 0 0  Language- repeat 1 1 1 1   Language- follow 3 step command  3 3 3   Language- read & follow direction  0 0 0  Write a sentence  0 0 0  Copy design  0 0 0  Total score  20 20 20         09/07/2023    1:42 PM 08/28/2022    8:30 AM 08/26/2021    9:00 AM  6CIT Screen  What Year? 0 points 0 points 0 points  What month? 0 points 0 points 0 points  What time? 0 points 0 points 0 points  Count back from 20 0 points 0 points 0 points  Months in reverse 0 points 0 points 0 points  Repeat phrase 0 points 0 points 0 points  Total Score 0 points 0 points 0 points    Immunizations Immunization History  Administered Date(s) Administered   Fluad Quad(high Dose 65+) 01/21/2019   Influenza,inj,Quad PF,6+ Mos 01/09/2014, 04/04/2015, 01/09/2016, 02/10/2017, 03/11/2018   PFIZER(Purple Top)SARS-COV-2 Vaccination 06/12/2019, 07/06/2019   Pneumococcal  Conjugate-13 04/04/2015   Pneumococcal Polysaccharide-23 09/30/2013   Td 10/06/2007   Varicella Zoster Immune Globulin 09/24/2010   Zoster, Live 11/24/2010    Screening Tests Health Maintenance  Topic Date Due   Zoster Vaccines- Shingrix (1 of 2) [birth year]24/1964   DTaP/Tdap/Td (2 - Tdap) 04/02/2024 (Originally 10/05/2017)   COVID-19 Vaccine (4 - 2024-25 season) 04/18/2025 (Originally 12/21/2022)   INFLUENZA VACCINE  11/20/2023   MAMMOGRAM  08/30/2024   Medicare Annual Wellness (AWV)  09/06/2024   Pneumonia Vaccine 26+ Years old  Completed   DEXA SCAN  Completed   Hepatitis C Screening  Completed   HPV VACCINES  Aged Out   Meningococcal B Vaccine  Aged Out   Fecal DNA (Cologuard)  Discontinued    Health Maintenance  Health Maintenance Due  Topic Date Due  Zoster Vaccines- Shingrix (1 of 2) 12[birth year]24/1964   Health Maintenance Items Addressed:declined shingles vaccine  Additional Screening:  Vision Screening: Recommended annual ophthalmology exams for early detection of glaucoma and other disorders of the eye.  Dental Screening: Recommended annual dental exams for proper oral hygiene  Community Resource Referral / Chronic Care Management: CRR required this visit?  No   CCM required this visit?  No   Plan:    I have personally reviewed and noted the following in the patient's chart:   Medical and social history Use of alcohol, tobacco or illicit drugs  Current medications and supplements including opioid prescriptions. Patient is not currently taking opioid prescriptions. Functional ability and status Nutritional status Physical activity Advanced directives List of other physicians Hospitalizations, surgeries, and ER visits in previous 12 months Vitals Screenings to include cognitive, depression, and falls Referrals and appointments  In addition, I have reviewed and discussed with patient certain preventive protocols, quality metrics, and best practice recommendations.  A written personalized care plan for preventive services as well as general preventive health recommendations were provided to patient.   Freeda Jerry, [place]   09/07/2023   After Visit Summary: (MyChart) Due to this being a telephonic visit, the after visit summary with patients personalized plan was offered to patient via MyChart   Notes: Nothing significant to report at this time.

## 2023-09-23 ENCOUNTER — Other Ambulatory Visit: Payer: Self-pay | Admitting: Family Medicine

## 2023-09-23 NOTE — Telephone Encounter (Signed)
 Refilled once  Please re schedule visit  Thanks

## 2023-09-23 NOTE — Telephone Encounter (Signed)
 Patient had CPE on 08/31/23 canceled and didn't reschedule.

## 2023-09-24 NOTE — Telephone Encounter (Signed)
 Spoke to pt, sch cpe for 10/21/23

## 2023-10-11 ENCOUNTER — Telehealth: Payer: Self-pay | Admitting: Family Medicine

## 2023-10-11 DIAGNOSIS — E559 Vitamin D deficiency, unspecified: Secondary | ICD-10-CM

## 2023-10-11 DIAGNOSIS — M810 Age-related osteoporosis without current pathological fracture: Secondary | ICD-10-CM

## 2023-10-11 DIAGNOSIS — E782 Mixed hyperlipidemia: Secondary | ICD-10-CM

## 2023-10-11 DIAGNOSIS — I10 Essential (primary) hypertension: Secondary | ICD-10-CM

## 2023-10-11 NOTE — Telephone Encounter (Signed)
-----   Message from Colleen Cochran sent at 09/28/2023 10:14 AM EDT ----- Regarding: Lab Wed 10/14/23 Hello,  Patient is coming in for CPE labs on Wednesday 10/14/23. Can we get orders please.   Thanks

## 2023-10-14 ENCOUNTER — Other Ambulatory Visit (INDEPENDENT_AMBULATORY_CARE_PROVIDER_SITE_OTHER)

## 2023-10-14 DIAGNOSIS — E559 Vitamin D deficiency, unspecified: Secondary | ICD-10-CM | POA: Diagnosis not present

## 2023-10-14 DIAGNOSIS — E782 Mixed hyperlipidemia: Secondary | ICD-10-CM

## 2023-10-14 DIAGNOSIS — I10 Essential (primary) hypertension: Secondary | ICD-10-CM

## 2023-10-14 DIAGNOSIS — E876 Hypokalemia: Secondary | ICD-10-CM

## 2023-10-14 LAB — CBC WITH DIFFERENTIAL/PLATELET
Basophils Absolute: 0 10*3/uL (ref 0.0–0.1)
Basophils Relative: 0.6 % (ref 0.0–3.0)
Eosinophils Absolute: 0.1 10*3/uL (ref 0.0–0.7)
Eosinophils Relative: 2.9 % (ref 0.0–5.0)
HCT: 38.6 % (ref 36.0–46.0)
Hemoglobin: 12.9 g/dL (ref 12.0–15.0)
Lymphocytes Relative: 25.6 % (ref 12.0–46.0)
Lymphs Abs: 1.3 10*3/uL (ref 0.7–4.0)
MCHC: 33.6 g/dL (ref 30.0–36.0)
MCV: 89 fl (ref 78.0–100.0)
Monocytes Absolute: 0.7 10*3/uL (ref 0.1–1.0)
Monocytes Relative: 13.4 % — ABNORMAL HIGH (ref 3.0–12.0)
Neutro Abs: 2.9 10*3/uL (ref 1.4–7.7)
Neutrophils Relative %: 57.5 % (ref 43.0–77.0)
Platelets: 332 10*3/uL (ref 150.0–400.0)
RBC: 4.33 Mil/uL (ref 3.87–5.11)
RDW: 13.2 % (ref 11.5–15.5)
WBC: 5.1 10*3/uL (ref 4.0–10.5)

## 2023-10-14 LAB — LIPID PANEL
Cholesterol: 161 mg/dL (ref 0–200)
HDL: 50.8 mg/dL (ref >39.00)
LDL Cholesterol: 91 mg/dL (ref 0–99)
NonHDL: 110.25
Total CHOL/HDL Ratio: 3
Triglycerides: 95 mg/dL (ref 0.0–149.0)
VLDL: 19 mg/dL (ref 0.0–40.0)

## 2023-10-14 LAB — COMPREHENSIVE METABOLIC PANEL WITH GFR
ALT: 16 U/L (ref 0–35)
AST: 21 U/L (ref 0–37)
Albumin: 4.4 g/dL (ref 3.5–5.2)
Alkaline Phosphatase: 54 U/L (ref 39–117)
BUN: 17 mg/dL (ref 6–23)
CO2: 30 meq/L (ref 19–32)
Calcium: 9.3 mg/dL (ref 8.4–10.5)
Chloride: 101 meq/L (ref 96–112)
Creatinine, Ser: 0.75 mg/dL (ref 0.40–1.20)
GFR: 77.13 mL/min (ref >60.00)
Glucose, Bld: 93 mg/dL (ref 70–99)
Potassium: 3.9 meq/L (ref 3.5–5.1)
Sodium: 139 meq/L (ref 135–145)
Total Bilirubin: 0.5 mg/dL (ref 0.2–1.2)
Total Protein: 7.3 g/dL (ref 6.0–8.3)

## 2023-10-14 LAB — BASIC METABOLIC PANEL WITH GFR
BUN: 17 mg/dL (ref 6–23)
CO2: 30 meq/L (ref 19–32)
Calcium: 9.3 mg/dL (ref 8.4–10.5)
Chloride: 101 meq/L (ref 96–112)
Creatinine, Ser: 0.75 mg/dL (ref 0.40–1.20)
GFR: 77.13 mL/min (ref >60.00)
Glucose, Bld: 93 mg/dL (ref 70–99)
Potassium: 3.9 meq/L (ref 3.5–5.1)
Sodium: 139 meq/L (ref 135–145)

## 2023-10-14 LAB — VITAMIN D 25 HYDROXY (VIT D DEFICIENCY, FRACTURES): VITD: 38.94 ng/mL (ref 30.00–100.00)

## 2023-10-14 LAB — TSH: TSH: 1.13 u[IU]/mL (ref 0.35–5.50)

## 2023-10-21 ENCOUNTER — Ambulatory Visit (INDEPENDENT_AMBULATORY_CARE_PROVIDER_SITE_OTHER): Admitting: Family Medicine

## 2023-10-21 ENCOUNTER — Encounter: Payer: Self-pay | Admitting: Family Medicine

## 2023-10-21 VITALS — BP 131/82 | HR 82 | Temp 98.4°F | Ht 64.5 in | Wt 139.0 lb

## 2023-10-21 DIAGNOSIS — I10 Essential (primary) hypertension: Secondary | ICD-10-CM | POA: Diagnosis not present

## 2023-10-21 DIAGNOSIS — M810 Age-related osteoporosis without current pathological fracture: Secondary | ICD-10-CM

## 2023-10-21 DIAGNOSIS — E782 Mixed hyperlipidemia: Secondary | ICD-10-CM

## 2023-10-21 DIAGNOSIS — Z0000 Encounter for general adult medical examination without abnormal findings: Secondary | ICD-10-CM | POA: Diagnosis not present

## 2023-10-21 DIAGNOSIS — E559 Vitamin D deficiency, unspecified: Secondary | ICD-10-CM

## 2023-10-21 DIAGNOSIS — Z1211 Encounter for screening for malignant neoplasm of colon: Secondary | ICD-10-CM | POA: Insufficient documentation

## 2023-10-21 DIAGNOSIS — E876 Hypokalemia: Secondary | ICD-10-CM

## 2023-10-21 DIAGNOSIS — R1013 Epigastric pain: Secondary | ICD-10-CM

## 2023-10-21 MED ORDER — LISINOPRIL-HYDROCHLOROTHIAZIDE 20-12.5 MG PO TABS: 1.0000 | ORAL_TABLET | Freq: Every day | ORAL | 3 refills | Status: AC

## 2023-10-21 NOTE — Assessment & Plan Note (Signed)
 Last vitamin D  Lab Results  Component Value Date   VD25OH 38.94 10/14/2023   Vitamin D  level is therapeutic with current supplementation Disc importance of this to bone and overall health

## 2023-10-21 NOTE — Assessment & Plan Note (Signed)
 Continues klor con 10 meq daily

## 2023-10-21 NOTE — Progress Notes (Signed)
 Subjective:    Patient ID: Colleen Cochran, female    DOB: October 06, 1945, 78 y.o.   MRN: [id number]  HPI  Here for health maintenance exam and to review chronic medical problems   Wt Readings from Last 3 Encounters:  10/21/23 139 lb (63 kg)  09/07/23 135 lb (61.2 kg)  05/18/23 143 lb (64.9 kg)   23.49 kg/m  Vitals:   10/21/23 0858 10/21/23 0928  BP: (!) 144/80 131/82  Pulse: 82   Temp: 98.4 F (36.9 C)   SpO2: 98%     Immunization History  Administered Date(s) Administered   Fluad Quad(high Dose 65+) 01/21/2019   Influenza,inj,Quad PF,6+ Mos 01/09/2014, 04/04/2015, 01/09/2016, 02/10/2017, 03/11/2018   PFIZER(Purple Top)SARS-COV-2 Vaccination 06/12/2019, 07/06/2019   Pneumococcal Conjugate-13 04/04/2015   Pneumococcal Polysaccharide-23 09/30/2013   Td 10/06/2007   Varicella Zoster Immune Globulin 09/24/2010   Zoster, Live 11/24/2010    There are no preventive care reminders to display for this patient.   Mammogram 08/2023 Self breast exam- no lumps   Gyn health No problems    Colon cancer screening  cologuard 09/2020 neg Wants to do this again    Bone health  Dexa 11/2021  osteoporosis  Did not tolerate alendronate   Falls-none  Fractures-none  Supplements -vitamin D  2000 international units bid  Also calcium from food -[facility]  Last vitamin D  Lab Results  Component Value Date   VD25OH 38.94 10/14/2023    Exercise :  Walking   Sees derm  Had mole removed on right chest   Mood    09/07/2023    1:45 PM 08/28/2022    8:29 AM 08/28/2022    8:19 AM 08/26/2021    8:51 AM 08/23/2020    8:58 AM  Depression screen PHQ 2/9  Decreased Interest 0 0 0 0 0  Down, Depressed, Hopeless 0 0 0 0 0  PHQ - 2 Score 0 0 0 0 0  Altered sleeping 1      Tired, decreased energy 0      Change in appetite 0      Feeling bad or failure about yourself  0      Trouble concentrating 0      Moving slowly or fidgety/restless 0      Suicidal thoughts 0      PHQ-9 Score 1       Difficult doing work/chores Not difficult at all          HTN bp is stable today  No cp or palpitations or headaches or edema  No side effects to medicines  BP Readings from Last 3 Encounters:  10/21/23 131/82  09/07/23 134/68  05/18/23 134/68    Lab Results  Component Value Date   NA 139 10/14/2023   NA 139 10/14/2023   K 3.9 10/14/2023   K 3.9 10/14/2023   CO2 30 10/14/2023   CO2 30 10/14/2023   GLUCOSE 93 10/14/2023   GLUCOSE 93 10/14/2023   BUN 17 10/14/2023   BUN 17 10/14/2023   CREATININE 0.75 10/14/2023   CREATININE 0.75 10/14/2023   CALCIUM 9.3 10/14/2023   CALCIUM 9.3 10/14/2023   GFR 77.13 10/14/2023   GFR 77.13 10/14/2023   GFRNONAA >60 01/05/2016   Sometimes blood pressure goes down right after eating    Lisinopril  hct 10-12.5 mg daily  Klor con   At home this am 125/80s     Hyperlipidemia Lab Results  Component Value Date   CHOL 161 10/14/2023   CHOL 148  09/08/2022   CHOL 162 08/27/2021   Lab Results  Component Value Date   HDL 50.80 10/14/2023   HDL 47.10 09/08/2022   HDL 46.20 08/27/2021   Lab Results  Component Value Date   LDLCALC 91 10/14/2023   LDLCALC 84 09/08/2022   LDLCALC 98 08/27/2021   Lab Results  Component Value Date   TRIG 95.0 10/14/2023   TRIG 85.0 09/08/2022   TRIG 92.0 08/27/2021   Lab Results  Component Value Date   CHOLHDL 3 10/14/2023   CHOLHDL 3 09/08/2022   CHOLHDL 4 08/27/2021   Lab Results  Component Value Date   LDLDIRECT 179.6 03/02/2009   Simvastatin  10 mg daily   Eating better lately  More produce  Eats chicken   Red meat is infrequent  Some fried foods      Lab Results  Component Value Date   ALT 16 10/14/2023   AST 21 10/14/2023   ALKPHOS 54 10/14/2023   BILITOT 0.5 10/14/2023     Lab Results  Component Value Date   WBC 5.1 10/14/2023   HGB 12.9 10/14/2023   HCT 38.6 10/14/2023   MCV 89.0 10/14/2023   PLT 332.0 10/14/2023   Lab Results  Component Value Date   TSH  1.13 10/14/2023       Patient Active Problem List   Diagnosis Date Noted   Colon cancer screening 10/21/2023   Anxious mood 04/28/2023   Hypokalemia 04/17/2023   Dyspepsia 04/03/2023   Bloating 04/03/2023   Diverticulosis 04/03/2023   Restless legs 04/03/2023   Encounter for screening mammogram for breast cancer 09/02/2021   Screening mammogram, encounter for 06/03/2017   Second hand smoke exposure 01/09/2016   Estrogen deficiency 04/04/2015   Family history of aortic aneurysm 09/30/2013   Routine general medical examination at a health care facility 11/17/2011   Hypertension 09/24/2010   Asymptomatic postmenopausal status 06/24/2010   Vitamin D  deficiency 03/02/2009   History of colon polyps - adenomas 12/17/2007   Hyperlipidemia 10/05/2007   Sinusitis, chronic 10/05/2007   Allergic rhinitis 10/05/2007   Osteoporosis 10/05/2007   NEPHROLITHIASIS, HX OF 10/05/2007   Past Medical History:  Diagnosis Date   Allergy    allergic rhinitis   Arthritis    History of colon polyps - adenomas 12/17/2007   Hyperlipidemia    Hypertension    Kidney stone    Nephrolithiasis    Hx of   Osteopenia    Past Surgical History:  Procedure Laterality Date   ABDOMINAL HYSTERECTOMY  04/21/1978   total for bleeding   CESAREAN SECTION  04/22/1967   COLONOSCOPY     DILATION AND CURETTAGE OF UTERUS     x2   EYE SURGERY  03/11/2021   eye surgey  02/18/2021   Social History   Tobacco Use   Smoking status: Never    Passive exposure: Yes (husband is a smoker but she doesn't smoke and never has.)   Smokeless tobacco: Never   Tobacco comments:    husband smokes  Vaping Use   Vaping status: Never Used  Substance Use Topics   Alcohol use: No    Alcohol/week: 0.0 standard drinks of alcohol   Drug use: No   Family History  Problem Relation Age of Onset   Hypertension Mother    Osteoporosis Mother    Heart disease Father        CABG,CAD,MI   Kidney failure Father    Osteoporosis  Father        ?  of OP pt had hip fx.   Hypertension Brother    Colon cancer Neg Hx    Pancreatic cancer Neg Hx    Rectal cancer Neg Hx    Stomach cancer Neg Hx    Breast cancer Neg Hx    Allergies  Allergen Reactions   Alendronate      General malaise    Current Outpatient Medications on File Prior to Visit  Medication Sig Dispense Refill   albuterol  (VENTOLIN  HFA) 108 (90 Base) MCG/ACT inhaler INHALE 1 TO 2 PUFFS BY MOUTH EVERY 4 HOURS (Patient taking differently: Inhale 2 puffs into the lungs every 6 (six) hours as needed. INHALE 1 TO 2 PUFFS BY MOUTH EVERY 4 HOURS) 6.7 g 1   aspirin 81 MG tablet Take 81 mg by mouth daily.     cholecalciferol (VITAMIN D ) 1000 UNITS tablet Take 2,000 Units by mouth daily.     famotidine  (PEPCID ) 20 MG tablet Take 1 tablet (20 mg total) by mouth 2 (two) times daily. (Patient taking differently: Take 20 mg by mouth daily as needed.) 180 tablet 3   fluticasone  (FLONASE ) 50 MCG/ACT nasal spray Place 2 sprays into both nostrils daily. 16 g 6   potassium chloride  (KLOR-CON  M) 10 MEQ tablet Take 1 tablet (10 mEq total) by mouth daily. 90 tablet 2   simvastatin  (ZOCOR ) 10 MG tablet TAKE 1 TABLET(10 MG) BY MOUTH DAILY 90 tablet 0   No current facility-administered medications on file prior to visit.    Review of Systems  Constitutional:  Negative for activity change, appetite change, fatigue, fever and unexpected weight change.  HENT:  Negative for congestion, ear pain, rhinorrhea, sinus pressure and sore throat.   Eyes:  Negative for pain, redness and visual disturbance.  Respiratory:  Negative for cough, shortness of breath and wheezing.   Cardiovascular:  Negative for chest pain and palpitations.  Gastrointestinal:  Negative for abdominal pain, blood in stool, constipation and diarrhea.  Endocrine: Negative for polydipsia and polyuria.  Genitourinary:  Negative for dysuria, frequency and urgency.  Musculoskeletal:  Negative for arthralgias, back pain  and myalgias.  Skin:  Negative for pallor and rash.  Allergic/Immunologic: Negative for environmental allergies.  Neurological:  Negative for dizziness, syncope and headaches.       Occational light headed  Hematological:  Negative for adenopathy. Does not bruise/bleed easily.  Psychiatric/Behavioral:  Negative for decreased concentration and dysphoric mood. The patient is not nervous/anxious.        Objective:   Physical Exam Constitutional:      General: She is not in acute distress.    Appearance: Normal appearance. She is well-developed and normal weight. She is not ill-appearing or diaphoretic.  HENT:     Head: Normocephalic and atraumatic.     Right Ear: Tympanic membrane, ear canal and external ear normal.     Left Ear: Tympanic membrane, ear canal and external ear normal.     Nose: Nose normal. No congestion.     Mouth/Throat:     Mouth: Mucous membranes are moist.     Pharynx: Oropharynx is clear. No posterior oropharyngeal erythema.  Eyes:     General: No scleral icterus.    Extraocular Movements: Extraocular movements intact.     Conjunctiva/sclera: Conjunctivae normal.     Pupils: Pupils are equal, round, and reactive to light.  Neck:     Thyroid : No thyromegaly.     Vascular: No carotid bruit or JVD.  Cardiovascular:     Rate and  Rhythm: Normal rate and regular rhythm.     Pulses: Normal pulses.     Heart sounds: Normal heart sounds.     No gallop.  Pulmonary:     Effort: Pulmonary effort is normal. No respiratory distress.     Breath sounds: Normal breath sounds. No wheezing.     Comments: Good air exch Chest:     Chest wall: No tenderness.  Abdominal:     General: Bowel sounds are normal. There is no distension or abdominal bruit.     Palpations: Abdomen is soft. There is no mass.     Tenderness: There is no abdominal tenderness.     Hernia: No hernia is present.  Genitourinary:    Comments: Breast exam: No mass, nodules, thickening, tenderness, bulging,  retraction, inflamation, nipple discharge or skin changes noted.  No axillary or clavicular LA.     Musculoskeletal:        General: No tenderness. Normal range of motion.     Cervical back: Normal range of motion and neck supple. No rigidity. No muscular tenderness.     Right lower leg: No edema.     Left lower leg: No edema.     Comments: No kyphosis   Lymphadenopathy:     Cervical: No cervical adenopathy.  Skin:    General: Skin is warm and dry.     Coloration: Skin is not pale.     Findings: No erythema or rash.     Comments: Solar lentigines diffusely Some sks   Scar on right chest from mole removal  Neurological:     Mental Status: She is alert. Mental status is at baseline.     Cranial Nerves: No cranial nerve deficit.     Motor: No abnormal muscle tone.     Coordination: Coordination normal.     Gait: Gait normal.     Deep Tendon Reflexes: Reflexes are normal and symmetric. Reflexes normal.  Psychiatric:        Mood and Affect: Mood normal.        Cognition and Memory: Cognition and memory normal.           Assessment & Plan:   Problem List Items Addressed This Visit       Cardiovascular and Mediastinum   Hypertension   Pt had elevated blood pressure after a very stressful day on 1/3 with some light headedness  This resolved Blood pressure is back to normal today   BP Readings from Last 1 Encounters:  10/21/23 131/82   No changes needed Will continue lisinopril  hct 10-12.5 mg daily   (with klor con 10 meq daily)  Most recent labs reviewed  Disc lifstyle change with low sodium diet and exercise   Discussed proper way to check blood pressure at home and tested her cuff today which seemed fairly accurate   Pt will continue to watch blood pressure for lows (has happened after eating)       Relevant Medications   lisinopril -hydrochlorothiazide  (ZESTORETIC ) 20-12.5 MG tablet     Musculoskeletal and Integument   Osteoporosis   Dexa ordered  Off  alendronate - made her have malaise/may be open to try again   Discussed fall prevention, supplements and exercise for bone density  Will add strength training exercise Continue vitamin D3 and balanced diet      Relevant Orders   DG Bone Density     Other   Vitamin D  deficiency   Last vitamin D  Lab Results  Component Value Date  VD25OH 38.94 10/14/2023   Vitamin D  level is therapeutic with current supplementation Disc importance of this to bone and overall health       Routine general medical examination at a health care facility - Primary   Reviewed health habits including diet and exercise and skin cancer prevention Reviewed appropriate screening tests for age  Also reviewed health mt list, fam hx and immunization status , as well as social and family history   See HPI Labs reviewed and ordered Health Maintenance  Topic Date Due   DTaP/Tdap/Td vaccine (2 - Tdap) 04/02/2024*   Zoster (Shingles) Vaccine (1 of 2) 01/20/2025*   COVID-19 Vaccine (4 - 2024-25 season) 04/18/2025*   Flu Shot  11/20/2023   Mammogram  08/30/2024   Medicare Annual Wellness Visit  10/20/2024   Pneumococcal Vaccine for age over 39  Completed   DEXA scan (bone density measurement)  Completed   Hepatitis C Screening  Completed   Hepatitis B Vaccine  Aged Out   HPV Vaccine  Aged Out   Meningitis B Vaccine  Aged Out   Cologuard (Stool DNA test)  Discontinued  *Topic was postponed. The date shown is not the original due date.    Dexa ordered -due in aug Cologuard ordered  Discussed fall prevention, supplements and exercise for bone density  PHQ 1       Hypokalemia   Continues klor con 10 meq daily      Hyperlipidemia   Disc goals for lipids and reasons to control them Rev last labs with pt Rev low sat fat diet in detail   Stable Simvastatin  10 mg daily       Relevant Medications   lisinopril -hydrochlorothiazide  (ZESTORETIC ) 20-12.5 MG tablet   Dyspepsia   Pepcid  prn Watches diet        Colon cancer screening   Cologuard 2022 neg Re ordered / 3 years       Relevant Orders   Cologuard

## 2023-10-21 NOTE — Assessment & Plan Note (Signed)
 Disc goals for lipids and reasons to control them Rev last labs with pt Rev low sat fat diet in detail   Stable Simvastatin  10 mg daily

## 2023-10-21 NOTE — Assessment & Plan Note (Signed)
 Dexa ordered  Off alendronate - made her have malaise/may be open to try again   Discussed fall prevention, supplements and exercise for bone density  Will add strength training exercise Continue vitamin D3 and balanced diet

## 2023-10-21 NOTE — Patient Instructions (Addendum)
 I ordered cologuard  If you don't hear from the company let us  know    Stay active -walk /bike  Add some strength training to your routine, this is important for bone and brain health and can reduce your risk of falls and help your body use insulin properly and regulate weight  Light weights, exercise bands , and internet videos are a good way to start  Yoga (chair or regular), machines , floor exercises or a gym with machines are also good options     You have an order for:  []   3D Mammogram  [x]   Bone Density     Please call for appointment:   [x]   [facility]  [address] [place] [address]  [phone]  []   [facility])   [address]  [place], [place] [address]  [phone]  []   [facility]      [address] [place], [place]        [phone]         []   [facility]  [address] [place], [place]  [phone]  []  [facility]   [address]   [place], [place] [address]  [phone]  []  [facility]  [address] [place], [place] [address] [phone]    Make sure to wear two piece clothing  No lotions powders or deodorants the day of the appointment Make sure to bring picture ID and insurance card.  Bring list of medications you are currently taking including any supplements.   Schedule your screening mammogram through MyChart!   Select [place] imaging sites can now be scheduled through MyChart.  Log into your MyChart account.  Go to 'Visit' (or 'Appointments' if  on mobile App) --> Schedule an  Appointment  Under 'Select a Reason for Visit' choose the Mammogram  Screening option.  Complete the pre-visit questions  and select the time and place that  best fits your schedule

## 2023-10-21 NOTE — Assessment & Plan Note (Signed)
 Reviewed health habits including diet and exercise and skin cancer prevention Reviewed appropriate screening tests for age  Also reviewed health mt list, fam hx and immunization status , as well as social and family history   See HPI Labs reviewed and ordered Health Maintenance  Topic Date Due   DTaP/Tdap/Td vaccine (2 - Tdap) 04/02/2024*   Zoster (Shingles) Vaccine (1 of 2) 01/20/2025*   COVID-19 Vaccine (4 - 2024-25 season) 04/18/2025*   Flu Shot  11/20/2023   Mammogram  08/30/2024   Medicare Annual Wellness Visit  10/20/2024   Pneumococcal Vaccine for age over 26  Completed   DEXA scan (bone density measurement)  Completed   Hepatitis C Screening  Completed   Hepatitis B Vaccine  Aged Out   HPV Vaccine  Aged Out   Meningitis B Vaccine  Aged Out   Cologuard (Stool DNA test)  Discontinued  *Topic was postponed. The date shown is not the original due date.    Dexa ordered -due in aug Cologuard ordered  Discussed fall prevention, supplements and exercise for bone density  PHQ 1

## 2023-10-21 NOTE — Assessment & Plan Note (Signed)
 Pepcid  prn Watches diet

## 2023-10-21 NOTE — Assessment & Plan Note (Signed)
 Pt had elevated blood pressure after a very stressful day on 1/3 with some light headedness  This resolved Blood pressure is back to normal today   BP Readings from Last 1 Encounters:  10/21/23 131/82   No changes needed Will continue lisinopril  hct 10-12.5 mg daily   (with klor con 10 meq daily)  Most recent labs reviewed  Disc lifstyle change with low sodium diet and exercise   Discussed proper way to check blood pressure at home and tested her cuff today which seemed fairly accurate   Pt will continue to watch blood pressure for lows (has happened after eating)

## 2023-10-21 NOTE — Assessment & Plan Note (Signed)
 Cologuard 2022 neg Re ordered / 3 years

## 2023-10-24 ENCOUNTER — Other Ambulatory Visit: Payer: Self-pay | Admitting: Family Medicine

## 2023-11-05 LAB — COLOGUARD: COLOGUARD: NEGATIVE

## 2023-11-06 ENCOUNTER — Ambulatory Visit: Payer: Self-pay | Admitting: Family Medicine

## 2023-12-20 ENCOUNTER — Other Ambulatory Visit: Payer: Self-pay | Admitting: Family Medicine

## 2024-02-25 ENCOUNTER — Ambulatory Visit: Payer: Self-pay | Admitting: *Deleted

## 2024-02-25 ENCOUNTER — Ambulatory Visit

## 2024-02-25 VITALS — BP 132/76 | HR 80 | Temp 99.1°F | Ht 64.5 in | Wt 140.0 lb

## 2024-02-25 DIAGNOSIS — R0789 Other chest pain: Secondary | ICD-10-CM | POA: Diagnosis not present

## 2024-02-25 DIAGNOSIS — R1013 Epigastric pain: Secondary | ICD-10-CM | POA: Diagnosis not present

## 2024-02-25 DIAGNOSIS — R140 Abdominal distension (gaseous): Secondary | ICD-10-CM

## 2024-02-25 MED ORDER — POLYETHYLENE GLYCOL 3350 17 GM/SCOOP PO POWD: 17.0000 g | Freq: Every day | ORAL | 0 refills | Status: AC | PRN

## 2024-02-25 MED ORDER — OMEPRAZOLE 40 MG PO CPDR: 40.0000 mg | DELAYED_RELEASE_CAPSULE | Freq: Every day | ORAL | 3 refills | Status: DC

## 2024-02-25 MED ORDER — SIMETHICONE 80 MG PO TABS: 80.0000 mg | ORAL_TABLET | Freq: Four times a day (QID) | ORAL | 0 refills | Status: DC | PRN

## 2024-02-25 NOTE — Progress Notes (Signed)
 Subjective:   This visit was conducted in person. The patient gave informed consent to the use of Abridge AI technology to record the contents of the encounter as documented below.   Patient ID: Colleen Cochran, female    DOB: 1945/05/13, 78 y.o.   MRN: [id number]   Discussed the use of AI scribe software for clinical note transcription with the patient, who gave verbal consent to proceed.  History of Present Illness Colleen Cochran is a 78 year old female who presents with abdominal discomfort and chest tightness.  She experiences abdominal discomfort characterized by a sensation of pressure and tightening, primarily located in the upper abdomen. The discomfort is described as soreness and bloating, beginning approximately four days ago. Symptoms are intermittent, often worsening after meals and improving between meals. No nausea, vomiting, diarrhea, or blood in stool. Bowel movements are typically daily, though occasionally she experiences constipation requiring straining. She has not used Miralax.  She reports a sensation of her heart being in her throat, which she associates with possible heartburn, though she denies any burning chest pain or acid regurgitation. She has taken Alka-Seltzer and famotidine  (Pepcid ) for relief, with the latter not being used consistently. No history of ulcers.  Intermittent chest tightness is described as soreness, beginning concurrently with her abdominal symptoms. She sometimes experiences shortness of breath and palpitations, described as her heart going 'boom, boom, boom.' These palpitations have been present for some time, occurring sporadically, with some weeks being symptom-free. No radiation of pain to the jaw or neck.  Her current medications include lisinopril  for blood pressure, which she notes fluctuates, and a daily aspirin. She also takes potassium supplements, which were started due to previously low potassium levels. She drinks primarily water,  with occasional consumption of Sprite or ginger ale. She denies significant weight loss and maintains a weight around 140 pounds. She has no known history of ulcers or significant gastrointestinal issues.   Review of Systems  All other systems reviewed and are negative.       Allergies  Allergen Reactions   Alendronate      General malaise     Current Outpatient Medications on File Prior to Visit  Medication Sig Dispense Refill   albuterol  (VENTOLIN  HFA) 108 (90 Base) MCG/ACT inhaler INHALE 1 TO 2 PUFFS BY MOUTH EVERY 4 HOURS (Patient taking differently: Inhale 2 puffs into the lungs every 6 (six) hours as needed. INHALE 1 TO 2 PUFFS BY MOUTH EVERY 4 HOURS) 6.7 g 1   aspirin 81 MG tablet Take 81 mg by mouth daily.     cholecalciferol (VITAMIN D ) 1000 UNITS tablet Take 2,000 Units by mouth daily.     fluticasone  (FLONASE ) 50 MCG/ACT nasal spray Place 2 sprays into both nostrils daily. 16 g 6   lisinopril -hydrochlorothiazide  (ZESTORETIC ) 20-12.5 MG tablet Take 1 tablet by mouth daily. 90 tablet 3   potassium chloride  (KLOR-CON  M) 10 MEQ tablet Take 1 tablet (10 mEq total) by mouth daily. 90 tablet 2   simvastatin  (ZOCOR ) 10 MG tablet TAKE 1 TABLET(10 MG) BY MOUTH DAILY 90 tablet 1   No current facility-administered medications on file prior to visit.    BP 132/76 (BP Location: Left Arm, Patient Position: Sitting, Cuff Size: Normal)   Pulse 80   Temp 99.1 F (37.3 C) (Oral)   Ht 5' 4.5 (1.638 m)   Wt 140 lb (63.5 kg)   SpO2 98%   BMI 23.66 kg/m   Objective:  Physical Exam GENERAL: Alert, cooperative, well developed, no acute distress. HEAD: Normocephalic atraumatic. CARDIOVASCULAR: Normal heart rate and rhythm, S1 and S2 normal without murmurs. CHEST: Clear to auscultation bilaterally, no wheezes, rhonchi, or crackles. Tenderness on palpation of the central chest wall.  There is also tenderness over the left-sided chest wall on manipulation manually. ABDOMEN: Soft,  non-tender, non-distended, without organomegaly. Normo-active bowel sounds, no rebound, no guarding. Generalized tenderness to palpation. EXTREMITIES: No cyanosis or edema. NEUROLOGICAL: Oriented to person, place and time, no gait abnormalities, moves all extremities without gross motor or sensory deficit.       Assessment & Plan:   Assessment & Plan Abdominal discomfort and bloating  Intermittent abdominal discomfort and bloating, worsened postprandially, possibly exacerbated by constipation. Differential also includes Hpylori gastritis. No alarming symptoms such as bloody/tarry stools or weight loss. Another possibility is medication side effect. Per chart review, patient has been on daily aspirin for several years for primary for CVD prevention, given advanced age and no prior MI/stroke, would re-consider, especially in light of increased risk of PUD and GI bleed in this population.  Discussed these risks with patient who verbalized understanding, will defer to PCP to re-discuss with patient.  - Ordered H. pylori breath test.  Will hold off starting PPI until this is done for accurate test. - Discontinued famotidine , in favor of omeprazole daily for four weeks, then as needed thereafter. - Prescribed Miralax daily for one week, then as needed. - Prescribed simethicone every six hours as needed for bloating. - Provided low FODMAP diet printout.  Chest discomfort  Intermittent chest discomfort likely musculoskeletal versus GERD.  She does admit her discomfort is primarily in the upper abdomen, not her actual chest. Reports of palpitations rarely.  TSH was normal a few months ago.  Cardiorespiratory exam is benign, chest pain is reproducible on manual manipulation.  Will still order EKG given atypical presentation is common in this population and go from there. No ST elevation or depression on EKG, incomplete RBBB present, however, I doubt this is the cause of her discomfort, based on her  description of the pain and overall clinical picture, more likely incidental finding.  Reassured patient and educated on warning flag symptoms including worsening left-sided chest pain, shortness of breath, radiating to the arm or jaw etc.  She verbalized understanding.  - Ordered EKG.    Return in about 4 weeks (around 03/24/2024) for Abdominal discomfort with PCP, schedule fasting lab visit tomorrow.   Charron Coultas K Jacobs Golab, MD  02/25/24     Contains text generated by Abridge.

## 2024-02-25 NOTE — Telephone Encounter (Signed)
 FYI Only or Action Required?: FYI only for provider: appointment scheduled on today.  Patient was last seen in primary care on 10/21/2023 by Randeen Laine LABOR, MD.  Called Nurse Triage reporting Abdominal Pain.  Symptoms began a week ago.  Interventions attempted: Prescription medications: pepcid  and Rest, hydration, or home remedies.  Symptoms are: gradually worsening.  Triage Disposition: See HCP Within 4 Hours (Or PCP Triage)  Patient/caregiver understands and will follow disposition?: Yes      Copied from CRM [id number]. Topic: Clinical - Red Word Triage >> Feb 25, 2024 12:59 PM Colleen Cochran wrote: [facility] that prompted transfer to Nurse Triage: Diverticulitis flare up, bloated/pain feeling and feeling of heart in throat. Reason for Disposition  [1] MILD-MODERATE pain AND [2] constant AND [3] present > 2 hours  Answer Assessment - Initial Assessment Questions Appt scheduled today with other provider. None available with PCP. Recommended if sx worsen go to ED.        1. LOCATION: Where does it hurt?      Upper  2. RADIATION: Does the pain shoot anywhere else? (e.g., chest, back)     Up to throat feels like heart in throat  3. ONSET: When did the pain begin? (e.g., minutes, hours or days ago)      Week ago  4. SUDDEN: Gradual or sudden onset?     Na  5. PATTERN Does the pain come and go, or is it constant?     Comes and goes worsening after eating but constant now  6. SEVERITY: How bad is the pain?  (e.g., Scale 1-10; mild, moderate, or severe)     Moderate bloated feeling  7. RECURRENT SYMPTOM: Have you ever had this type of stomach pain before? If Yes, ask: When was the last time? and What happened that time?      This week  8. CAUSE: What do you think is causing the stomach pain? (e.g., gallstones, recent abdominal surgery)     Possible  9. RELIEVING/AGGRAVATING FACTORS: What makes it better or worse? (e.g., antacids, bending or twisting motion, bowel  movement)     Alkazelter made better 10. OTHER SYMPTOMS: Do you have any other symptoms? (e.g., back pain, diarrhea, fever, urination pain, vomiting)       Able to belch taking alkazelter. Upper abdominal pain.  Upper Chest pain ,chest tightness. SOB every once in a while, not now . No dizziness no sweating no nausea. No vomiting. No fever.  Bloating upper abdominal area. Constipation had to strain this am for BM 11. PREGNANCY: Is there any chance you are pregnant? When was your last menstrual period?       na  Protocols used: Abdominal Pain - Female-A-AH

## 2024-02-25 NOTE — Patient Instructions (Addendum)
 Thank you for visiting  Healthcare today! Here's what we talked about:  -Do NOT take Omperazole until tomorrow after you have had the lab test. Please do not take famotidine  either. Okay to start taking the miralax and simethicone - Use miralax daily for 1 week, then as needed after. Use only if no bowel movement for 2 days. - Use simethicone as needed for bloating/gassy feeling - After lab, Start omeprazole daily for 4 weeks, after that use only as needed

## 2024-02-25 NOTE — Telephone Encounter (Signed)
 Thanks for seeing her  Aware, will watch for correspondence

## 2024-02-26 ENCOUNTER — Other Ambulatory Visit

## 2024-02-26 ENCOUNTER — Ambulatory Visit: Payer: Self-pay

## 2024-02-26 DIAGNOSIS — R1013 Epigastric pain: Secondary | ICD-10-CM

## 2024-03-01 LAB — H. PYLORI BREATH TEST: H. pylori Breath Test: NOT DETECTED

## 2024-03-24 ENCOUNTER — Ambulatory Visit (INDEPENDENT_AMBULATORY_CARE_PROVIDER_SITE_OTHER): Admitting: Family Medicine

## 2024-03-24 ENCOUNTER — Encounter: Payer: Self-pay | Admitting: Family Medicine

## 2024-03-24 ENCOUNTER — Ambulatory Visit: Admitting: Family Medicine

## 2024-03-24 ENCOUNTER — Ambulatory Visit: Payer: Self-pay | Admitting: Family Medicine

## 2024-03-24 VITALS — BP 128/74 | HR 92 | Temp 97.9°F | Ht 64.5 in | Wt 136.4 lb

## 2024-03-24 DIAGNOSIS — R252 Cramp and spasm: Secondary | ICD-10-CM | POA: Diagnosis not present

## 2024-03-24 DIAGNOSIS — R140 Abdominal distension (gaseous): Secondary | ICD-10-CM | POA: Diagnosis not present

## 2024-03-24 DIAGNOSIS — E876 Hypokalemia: Secondary | ICD-10-CM | POA: Diagnosis not present

## 2024-03-24 DIAGNOSIS — R1013 Epigastric pain: Secondary | ICD-10-CM | POA: Diagnosis not present

## 2024-03-24 DIAGNOSIS — I10 Essential (primary) hypertension: Secondary | ICD-10-CM | POA: Diagnosis not present

## 2024-03-24 LAB — CBC WITH DIFFERENTIAL/PLATELET
Basophils Absolute: 0 K/uL (ref 0.0–0.1)
Basophils Relative: 0.7 % (ref 0.0–3.0)
Eosinophils Absolute: 0.1 K/uL (ref 0.0–0.7)
Eosinophils Relative: 1.6 % (ref 0.0–5.0)
HCT: 37.9 % (ref 36.0–46.0)
Hemoglobin: 12.9 g/dL (ref 12.0–15.0)
Lymphocytes Relative: 18.6 % (ref 12.0–46.0)
Lymphs Abs: 1 K/uL (ref 0.7–4.0)
MCHC: 34 g/dL (ref 30.0–36.0)
MCV: 88.5 fl (ref 78.0–100.0)
Monocytes Absolute: 0.6 K/uL (ref 0.1–1.0)
Monocytes Relative: 10.6 % (ref 3.0–12.0)
Neutro Abs: 3.7 K/uL (ref 1.4–7.7)
Neutrophils Relative %: 68.5 % (ref 43.0–77.0)
Platelets: 347 K/uL (ref 150.0–400.0)
RBC: 4.29 Mil/uL (ref 3.87–5.11)
RDW: 13.7 % (ref 11.5–15.5)
WBC: 5.4 K/uL (ref 4.0–10.5)

## 2024-03-24 LAB — TSH: TSH: 0.96 u[IU]/mL (ref 0.35–5.50)

## 2024-03-24 LAB — BASIC METABOLIC PANEL WITH GFR
BUN: 15 mg/dL (ref 6–23)
CO2: 31 meq/L (ref 19–32)
Calcium: 10.1 mg/dL (ref 8.4–10.5)
Chloride: 100 meq/L (ref 96–112)
Creatinine, Ser: 0.75 mg/dL (ref 0.40–1.20)
GFR: 76.89 mL/min (ref >60.00)
Glucose, Bld: 74 mg/dL (ref 70–99)
Potassium: 3.6 meq/L (ref 3.5–5.1)
Sodium: 139 meq/L (ref 135–145)

## 2024-03-24 NOTE — Assessment & Plan Note (Signed)
 Resolved after treatment of dyspepsia with 2 wk of ppi Also miralax  for constipation Eating better and feels better  Encouraged to call if symptoms return  Call back and Er precautions noted in detail today

## 2024-03-24 NOTE — Patient Instructions (Signed)
 Lab today for potassium, chemistries, thyroid  and blood count   Exercise / stretching will help cramps  Use your exercise bike Add some strength training   Add some strength training to your routine, this is important for bone and brain health and can reduce your risk of falls and help your body use insulin properly and regulate weight  Light weights, exercise bands , and internet videos are a good way to start  Yoga (chair or regular), machines , floor exercises or a gym with machines are also good options   Stretching after you exercise helps also   Stay hydrated  Use the famotidine  (pepcid ) for heartburn if needed   If GI or chest symptoms return- call  If severe - go to the ER

## 2024-03-24 NOTE — Assessment & Plan Note (Signed)
 Reviewed noted from Dr Bennett a month ago -upper abd pain, chest discomfort / bloating and come constipation  Reassuring EKG/lab (neg h pylori breath test)  Overall resolved 2 wk of omerprazole Has famotidine  for prn use Eating better Miralax  for constipation   Encouraged to avoid nsaids (besides daily small asa)   Instructed to call if symptoms return  Call back and Er precautions noted in detail today

## 2024-03-24 NOTE — Assessment & Plan Note (Addendum)
 Pt held K for 2 d when she had GI symptoms  Better now   Bmet today

## 2024-03-24 NOTE — Assessment & Plan Note (Signed)
 bp in fair control at this time  BP Readings from Last 1 Encounters:  03/24/24 128/74   No changes needed Most recent labs reviewed  Disc lifstyle change with low sodium diet and exercise  Continues lisinopril  hct 10-12.5 mg daily  Also replacing potassium

## 2024-03-24 NOTE — Progress Notes (Signed)
 Subjective:    Patient ID: Colleen Cochran, female    DOB: 05/08/1945, 78 y.o.   MRN: [id number]  HPI  Wt Readings from Last 3 Encounters:  03/24/24 136 lb 6 oz (61.9 kg)  02/25/24 140 lb (63.5 kg)  10/21/23 139 lb (63 kg)   23.05 kg/m  Vitals:   03/24/24 0917  BP: 128/74  Pulse: 92  Temp: 97.9 F (36.6 C)  SpO2: 97%    Pt presents for 1 month follow up for  Abdominal discomfort/bloating and chest discomfort Calf cramps-on /off 2 months   Saw Dr Bennett a month ago KG noted no acute changes, incomplete RBBB noted  Ordered h pylori breath test which was neg for H pylori   Treated for GERD/gastritis -omeprazole  for 1 month  Also miralax  for constipation  Simethicone  for bloating/gas Given FODMAP diet handout   Pt is feeling better  Took omeprazole  for a few weeks   Chest symptoms are gone /much better  No palpitations recently  Stomach is better  Heartburn- very infrequently - occational rolaids  Bloating is better  Constipation is better also   Trying to eat better  Using miralax    Baby asa daily  No other nsaids   No more stress than usual      BP Readings from Last 3 Encounters:  03/24/24 128/74  02/25/24 132/76  10/21/23 131/82   Pulse Readings from Last 3 Encounters:  03/24/24 92  02/25/24 80  10/21/23 82   Lisinopril  hct 10-12.5 mg  Trying to avoid sodium   Lab Results  Component Value Date   NA 139 10/14/2023   NA 139 10/14/2023   K 3.9 10/14/2023   K 3.9 10/14/2023   CO2 30 10/14/2023   CO2 30 10/14/2023   GLUCOSE 93 10/14/2023   GLUCOSE 93 10/14/2023   BUN 17 10/14/2023   BUN 17 10/14/2023   CREATININE 0.75 10/14/2023   CREATININE 0.75 10/14/2023   CALCIUM 9.3 10/14/2023   CALCIUM 9.3 10/14/2023   GFR 77.13 10/14/2023   GFR 77.13 10/14/2023   GFRNONAA >60 01/05/2016   Some cramps in legs Held potassium for several days   Lab Results  Component Value Date   WBC 5.1 10/14/2023   HGB 12.9 10/14/2023   HCT 38.6  10/14/2023   MCV 89.0 10/14/2023   PLT 332.0 10/14/2023    Lab Results  Component Value Date   TSH 1.13 10/14/2023     Patient Active Problem List   Diagnosis Date Noted   Muscle cramps 03/24/2024   Colon cancer screening 10/21/2023   Anxious mood 04/28/2023   Hypokalemia 04/17/2023   Dyspepsia 04/03/2023   Bloating 04/03/2023   Diverticulosis 04/03/2023   Restless legs 04/03/2023   Encounter for screening mammogram for breast cancer 09/02/2021   Screening mammogram, encounter for 06/03/2017   Second hand smoke exposure 01/09/2016   Estrogen deficiency 04/04/2015   Family history of aortic aneurysm 09/30/2013   Routine general medical examination at a health care facility 11/17/2011   Hypertension 09/24/2010   Asymptomatic postmenopausal status 06/24/2010   Vitamin D  deficiency 03/02/2009   History of colon polyps - adenomas 12/17/2007   Hyperlipidemia 10/05/2007   Sinusitis, chronic 10/05/2007   Allergic rhinitis 10/05/2007   Osteoporosis 10/05/2007   NEPHROLITHIASIS, HX OF 10/05/2007   Past Medical History:  Diagnosis Date   Allergy    allergic rhinitis   Arthritis    History of colon polyps - adenomas 12/17/2007   Hyperlipidemia  Hypertension    Kidney stone    Nephrolithiasis    Hx of   Osteopenia    Past Surgical History:  Procedure Laterality Date   ABDOMINAL HYSTERECTOMY  04/21/1978   total for bleeding   CESAREAN SECTION  04/22/1967   COLONOSCOPY     DILATION AND CURETTAGE OF UTERUS     x2   EYE SURGERY  03/11/2021   eye surgey  02/18/2021   Social History   Tobacco Use   Smoking status: Never    Passive exposure: Yes (husband is a smoker but she doesn't smoke and never has.)   Smokeless tobacco: Never   Tobacco comments:    husband smokes  Vaping Use   Vaping status: Never Used  Substance Use Topics   Alcohol use: No    Alcohol/week: 0.0 standard drinks of alcohol   Drug use: No   Family History  Problem Relation Age of Onset    Hypertension Mother    Osteoporosis Mother    Heart disease Father        CABG,CAD,MI   Kidney failure Father    Osteoporosis Father        ? of OP pt had hip fx.   Hypertension Brother    Colon cancer Neg Hx    Pancreatic cancer Neg Hx    Rectal cancer Neg Hx    Stomach cancer Neg Hx    Breast cancer Neg Hx    Allergies  Allergen Reactions   Alendronate      General malaise    Current Outpatient Medications on File Prior to Visit  Medication Sig Dispense Refill   albuterol  (VENTOLIN  HFA) 108 (90 Base) MCG/ACT inhaler INHALE 1 TO 2 PUFFS BY MOUTH EVERY 4 HOURS (Patient taking differently: Inhale 2 puffs into the lungs every 6 (six) hours as needed. INHALE 1 TO 2 PUFFS BY MOUTH EVERY 4 HOURS) 6.7 g 1   aspirin 81 MG tablet Take 81 mg by mouth daily.     cholecalciferol (VITAMIN D ) 1000 UNITS tablet Take 2,000 Units by mouth daily.     fluticasone  (FLONASE ) 50 MCG/ACT nasal spray Place 2 sprays into both nostrils daily. 16 g 6   lisinopril -hydrochlorothiazide  (ZESTORETIC ) 20-12.5 MG tablet Take 1 tablet by mouth daily. 90 tablet 3   polyethylene glycol powder (GLYCOLAX /MIRALAX ) 17 GM/SCOOP powder Take 17 g by mouth daily as needed. Dissolve 1 capful (17g) in 4-8 ounces of liquid and take by mouth daily. 510 g 0   potassium chloride  (KLOR-CON  M) 10 MEQ tablet Take 1 tablet (10 mEq total) by mouth daily. 90 tablet 2   simvastatin  (ZOCOR ) 10 MG tablet TAKE 1 TABLET(10 MG) BY MOUTH DAILY 90 tablet 1   No current facility-administered medications on file prior to visit.    Review of Systems  Constitutional:  Negative for activity change, appetite change, fatigue, fever and unexpected weight change.  HENT:  Negative for congestion, ear pain, rhinorrhea, sinus pressure and sore throat.   Eyes:  Negative for pain, redness and visual disturbance.  Respiratory:  Negative for cough, shortness of breath and wheezing.   Cardiovascular:  Negative for chest pain, palpitations and leg swelling.   Gastrointestinal:  Negative for abdominal distention, abdominal pain, blood in stool, constipation, diarrhea, nausea and vomiting.  Endocrine: Negative for polydipsia and polyuria.  Genitourinary:  Negative for dysuria, frequency and urgency.  Musculoskeletal:  Negative for arthralgias, back pain and myalgias.       Muscle cramps in calves at night  Skin:  Negative for pallor and rash.  Allergic/Immunologic: Negative for environmental allergies.  Neurological:  Negative for dizziness, syncope and headaches.  Hematological:  Negative for adenopathy. Does not bruise/bleed easily.  Psychiatric/Behavioral:  Negative for decreased concentration and dysphoric mood. The patient is nervous/anxious.        Objective:   Physical Exam Constitutional:      General: She is not in acute distress.    Appearance: Normal appearance. She is well-developed and normal weight. She is not ill-appearing or diaphoretic.  HENT:     Head: Normocephalic and atraumatic.     Mouth/Throat:     Mouth: Mucous membranes are moist.  Eyes:     Conjunctiva/sclera: Conjunctivae normal.     Pupils: Pupils are equal, round, and reactive to light.  Neck:     Thyroid : No thyromegaly.     Vascular: No carotid bruit or JVD.  Cardiovascular:     Rate and Rhythm: Normal rate and regular rhythm.     Pulses: Normal pulses.     Heart sounds: Normal heart sounds.     No gallop.  Pulmonary:     Effort: Pulmonary effort is normal. No respiratory distress.     Breath sounds: Normal breath sounds. No wheezing or rales.  Abdominal:     General: Abdomen is flat. Bowel sounds are normal. There is no distension or abdominal bruit.     Palpations: Abdomen is soft. There is no shifting dullness, fluid wave, hepatomegaly, splenomegaly, mass or pulsatile mass.     Tenderness: There is no right CVA tenderness, left CVA tenderness, guarding or rebound. Negative signs include Murphy's sign and McBurney's sign.     Comments: Slight  tenderness on very deep palpation of epigastrium  Otherwise none   Musculoskeletal:     Cervical back: Normal range of motion and neck supple.     Right lower leg: No edema.     Left lower leg: No edema.     Comments: No leg/calf tenderness No edema No palp cords Normal rom knees/ankles/feet   Lymphadenopathy:     Cervical: No cervical adenopathy.  Skin:    General: Skin is warm and dry.     Coloration: Skin is not pale.     Findings: No rash.  Neurological:     Mental Status: She is alert.     Coordination: Coordination normal.     Deep Tendon Reflexes: Reflexes are normal and symmetric. Reflexes normal.  Psychiatric:        Mood and Affect: Mood is anxious.           Assessment & Plan:   Problem List Items Addressed This Visit       Cardiovascular and Mediastinum   Hypertension   bp in fair control at this time  BP Readings from Last 1 Encounters:  03/24/24 128/74   No changes needed Most recent labs reviewed  Disc lifstyle change with low sodium diet and exercise  Continues lisinopril  hct 10-12.5 mg daily  Also replacing potassium       Relevant Orders   Basic metabolic panel with GFR   CBC with Differential/Platelet   TSH     Other   Muscle cramps - Primary   Cramps in calf muscles at night (leads to soreness the next day)  About 2 months Not active lately Reassuring exam   Lab today incl bmet , cbc, tsh Discussed plan for exercise Plans to bring in exercise bike Reviewed opt for strength training  Then stretching - chair yoga is a good option   Staying well hydrated   Instructed to update if worse or not improvement  Call back and Er precautions noted in detail today        Relevant Orders   Basic metabolic panel with GFR   CBC with Differential/Platelet   TSH   Hypokalemia   Pt held K for 2 d when she had GI symptoms  Better now   Bmet today      Relevant Orders   Basic metabolic panel with GFR   Dyspepsia   Reviewed noted  from Dr Bennett a month ago -upper abd pain, chest discomfort / bloating and come constipation  Reassuring EKG/lab (neg h pylori breath test)  Overall resolved 2 wk of omerprazole Has famotidine  for prn use Eating better Miralax  for constipation   Encouraged to avoid nsaids (besides daily small asa)   Instructed to call if symptoms return  Call back and Er precautions noted in detail today         Relevant Orders   CBC with Differential/Platelet   Bloating   Resolved after treatment of dyspepsia with 2 wk of ppi Also miralax  for constipation Eating better and feels better  Encouraged to call if symptoms return  Call back and Er precautions noted in detail today

## 2024-03-24 NOTE — Assessment & Plan Note (Addendum)
 Cramps in calf muscles at night (leads to soreness the next day)  About 2 months Not active lately Reassuring exam   Lab today incl bmet , cbc, tsh Discussed plan for exercise Plans to bring in exercise bike Reviewed opt for strength training  Then stretching - chair yoga is a good option   Staying well hydrated   Instructed to update if worse or not improvement  Call back and Er precautions noted in detail today

## 2024-04-27 ENCOUNTER — Other Ambulatory Visit: Payer: Self-pay | Admitting: Family Medicine

## 2024-09-08 ENCOUNTER — Ambulatory Visit

## 2024-09-16 ENCOUNTER — Ambulatory Visit
